# Patient Record
Sex: Female | Born: 1940 | Race: White | Hispanic: No | Smoking: Former smoker
Health system: Southern US, Community
[De-identification: ages and names within clinical notes are randomized; demographics above are authoritative.]

## PROBLEM LIST (undated history)

## (undated) DIAGNOSIS — N189 Chronic kidney disease, unspecified: Secondary | ICD-10-CM

## (undated) DIAGNOSIS — I471 Supraventricular tachycardia, unspecified: Secondary | ICD-10-CM

## (undated) DIAGNOSIS — C55 Malignant neoplasm of uterus, part unspecified: Secondary | ICD-10-CM

## (undated) DIAGNOSIS — M1990 Unspecified osteoarthritis, unspecified site: Secondary | ICD-10-CM

## (undated) DIAGNOSIS — I10 Essential (primary) hypertension: Secondary | ICD-10-CM

## (undated) DIAGNOSIS — E785 Hyperlipidemia, unspecified: Secondary | ICD-10-CM

## (undated) DIAGNOSIS — K589 Irritable bowel syndrome without diarrhea: Secondary | ICD-10-CM

## (undated) DIAGNOSIS — J449 Chronic obstructive pulmonary disease, unspecified: Secondary | ICD-10-CM

## (undated) DIAGNOSIS — J9611 Chronic respiratory failure with hypoxia: Secondary | ICD-10-CM

## (undated) DIAGNOSIS — G2581 Restless legs syndrome: Secondary | ICD-10-CM

## (undated) DIAGNOSIS — C541 Malignant neoplasm of endometrium: Secondary | ICD-10-CM

## (undated) DIAGNOSIS — K5790 Diverticulosis of intestine, part unspecified, without perforation or abscess without bleeding: Secondary | ICD-10-CM

## (undated) HISTORY — DX: Essential (primary) hypertension: I10

## (undated) HISTORY — DX: Chronic kidney disease, unspecified: N18.9

## (undated) HISTORY — DX: Restless legs syndrome: G25.81

## (undated) HISTORY — DX: Diverticulosis of intestine, part unspecified, without perforation or abscess without bleeding: K57.90

## (undated) HISTORY — PX: ABDOMINAL HYSTERECTOMY: SHX81

## (undated) HISTORY — DX: Unspecified osteoarthritis, unspecified site: M19.90

## (undated) HISTORY — DX: Malignant neoplasm of endometrium: C54.1

## (undated) HISTORY — DX: Hyperlipidemia, unspecified: E78.5

## (undated) HISTORY — DX: Malignant neoplasm of uterus, part unspecified: C55

## (undated) HISTORY — DX: Irritable bowel syndrome, unspecified: K58.9

---

## 2000-12-08 HISTORY — PX: LUNG SURGERY: SHX703

## 2002-06-10 ENCOUNTER — Inpatient Hospital Stay (HOSPITAL_COMMUNITY): Admission: EM | Admit: 2002-06-10 | Discharge: 2002-06-12 | Payer: Self-pay | Admitting: Emergency Medicine

## 2002-06-10 ENCOUNTER — Encounter: Payer: Self-pay | Admitting: Internal Medicine

## 2002-06-11 ENCOUNTER — Encounter: Payer: Self-pay | Admitting: Internal Medicine

## 2002-06-12 ENCOUNTER — Encounter: Payer: Self-pay | Admitting: Internal Medicine

## 2002-07-21 ENCOUNTER — Encounter: Payer: Self-pay | Admitting: Internal Medicine

## 2002-07-21 ENCOUNTER — Encounter: Admission: RE | Admit: 2002-07-21 | Discharge: 2002-07-21 | Payer: Self-pay | Admitting: Internal Medicine

## 2002-08-15 ENCOUNTER — Encounter: Admission: RE | Admit: 2002-08-15 | Discharge: 2002-08-15 | Payer: Self-pay | Admitting: Gastroenterology

## 2002-08-15 ENCOUNTER — Encounter: Payer: Self-pay | Admitting: Gastroenterology

## 2002-09-28 ENCOUNTER — Ambulatory Visit (HOSPITAL_COMMUNITY): Admission: RE | Admit: 2002-09-28 | Discharge: 2002-09-28 | Payer: Self-pay | Admitting: Gastroenterology

## 2003-04-13 ENCOUNTER — Other Ambulatory Visit: Admission: RE | Admit: 2003-04-13 | Discharge: 2003-04-13 | Payer: Self-pay | Admitting: Obstetrics and Gynecology

## 2004-03-06 ENCOUNTER — Encounter: Admission: RE | Admit: 2004-03-06 | Discharge: 2004-03-06 | Payer: Self-pay | Admitting: Internal Medicine

## 2004-05-01 ENCOUNTER — Encounter: Admission: RE | Admit: 2004-05-01 | Discharge: 2004-05-01 | Payer: Self-pay | Admitting: Internal Medicine

## 2004-06-05 ENCOUNTER — Other Ambulatory Visit: Admission: RE | Admit: 2004-06-05 | Discharge: 2004-06-05 | Payer: Self-pay | Admitting: Obstetrics and Gynecology

## 2005-05-13 ENCOUNTER — Encounter: Admission: RE | Admit: 2005-05-13 | Discharge: 2005-05-13 | Payer: Self-pay | Admitting: Internal Medicine

## 2005-05-13 ENCOUNTER — Encounter: Admission: RE | Admit: 2005-05-13 | Discharge: 2005-05-13 | Payer: Self-pay | Admitting: Obstetrics and Gynecology

## 2005-06-12 ENCOUNTER — Other Ambulatory Visit: Admission: RE | Admit: 2005-06-12 | Discharge: 2005-06-12 | Payer: Self-pay | Admitting: Obstetrics and Gynecology

## 2005-07-14 ENCOUNTER — Encounter: Admission: RE | Admit: 2005-07-14 | Discharge: 2005-07-14 | Payer: Self-pay | Admitting: Obstetrics and Gynecology

## 2006-05-13 ENCOUNTER — Encounter: Admission: RE | Admit: 2006-05-13 | Discharge: 2006-05-13 | Payer: Self-pay | Admitting: Internal Medicine

## 2006-05-18 ENCOUNTER — Encounter: Admission: RE | Admit: 2006-05-18 | Discharge: 2006-05-18 | Payer: Self-pay | Admitting: Internal Medicine

## 2006-08-13 ENCOUNTER — Encounter: Admission: RE | Admit: 2006-08-13 | Discharge: 2006-08-13 | Payer: Self-pay | Admitting: Internal Medicine

## 2006-09-23 ENCOUNTER — Other Ambulatory Visit: Admission: RE | Admit: 2006-09-23 | Discharge: 2006-09-23 | Payer: Self-pay | Admitting: Obstetrics and Gynecology

## 2006-10-05 ENCOUNTER — Encounter: Admission: RE | Admit: 2006-10-05 | Discharge: 2006-10-05 | Payer: Self-pay | Admitting: Obstetrics and Gynecology

## 2007-11-17 ENCOUNTER — Encounter: Admission: RE | Admit: 2007-11-17 | Discharge: 2007-11-17 | Payer: Self-pay | Admitting: Obstetrics and Gynecology

## 2008-02-14 ENCOUNTER — Encounter: Admission: RE | Admit: 2008-02-14 | Discharge: 2008-02-14 | Payer: Self-pay | Admitting: Internal Medicine

## 2008-10-09 ENCOUNTER — Encounter (INDEPENDENT_AMBULATORY_CARE_PROVIDER_SITE_OTHER): Payer: Self-pay | Admitting: Urology

## 2008-10-09 ENCOUNTER — Ambulatory Visit (HOSPITAL_BASED_OUTPATIENT_CLINIC_OR_DEPARTMENT_OTHER): Admission: RE | Admit: 2008-10-09 | Discharge: 2008-10-09 | Payer: Self-pay | Admitting: Urology

## 2008-10-23 ENCOUNTER — Ambulatory Visit (HOSPITAL_BASED_OUTPATIENT_CLINIC_OR_DEPARTMENT_OTHER): Admission: RE | Admit: 2008-10-23 | Discharge: 2008-10-23 | Payer: Self-pay | Admitting: Urology

## 2011-04-22 NOTE — Op Note (Signed)
NAME:  Carol Carson, Carol Carson               ACCOUNT NO.:  [id number]   MEDICAL RECORD NO.:  [id number]          PATIENT TYPE:  AMB   LOCATION:  [facility]                         FACILITY:  [facility]   PHYSICIAN:  Carol Carson, M.D.  DATE OF BIRTH:  [birth year]   DATE OF PROCEDURE:  10/23/2008  DATE OF DISCHARGE:                               OPERATIVE REPORT   SURGEON:  Carol Carson, M.D.   ASSISTANT:  Dr. Aldean Carson.   PREOPERATIVE DIAGNOSIS:  Stress urinary incontinence after recent  transobturator tape.   POSTOPERATIVE DIAGNOSIS:  Stress urinary incontinence after recent  transobturator tape.   PROCEDURES:  1. Complete removal of transobturator tape.  2. Placement of suprapubic mid urethral tension-free sling with      Tutoplast dermis graft.  [place] system from [facility].  3. Pan cystourethroscopy.   INDICATIONS:  Carol Carson is a very pleasant 70 year old female with a  history of stress urinary incontinence and chronic vulvodynia.  She  underwent an anterior repair and transobturator sling done on October 09, 2008.  Postoperatively, she had persistent stress urinary  incontinence and after discussion of the risks and benefits, she elected  to proceed with sling takedown and redo sling.  Again, risks and  benefits were discussed with the patient in advance including the risks  of mesh erosion and extrusion particularly in the setting of a redo  surgery.   PROCEDURE IN DETAIL:  The patient was brought back to the operating room  and after successful induction of LMA anesthetic, she was placed in  dorsal position and all pressure points were padded appropriately.  A  preoperative time-out was performed.  She received perioperative  antibiotics.   A [place] retractor was placed and her labia were retracted laterally  and her posterior fourchette was retracted posteriorly.  We then  identified the previous incision site and opened it up with a knife.  We  then able to easily identify the  transobturator tape which had loosened  a significant amount and was present slightly distal to the mid urethra.  It was removed from both sides in its entirety with some traction.   At this point with manual palpation, the tissue over the urethra  appeared thin.  The Foley catheter could not be seen and there was no  evidence of urethral injury.  However, again the tissue was thin.  At  this point we elected to use a Tutoplast dermis graft that was placed on  top of the mid urethral sling such that there was a fair amount of  tissue between the urethra and the sling to prevent or minimize the risk  of erosion and extrusion.  At this point with adequate dissection, the  suprapubic needle passers were placed and placed behind the bone through  the ipsilateral vaginal dissection and out through the incision.  With  both the passers in place, pan cystourethroscopy was performed and there  was no evidence of urethra or bladder injury.  There is no evidence of  hematuria or other anomalies.  Both ureteral orifices were seen  effluxing urine  on both sides.  At this point the fashioned [place] graft  with the Tutoplast dermis was placed on the passers and brought back up  to the suprapubic stab incisions.  Adequate tensioning was accomplished  and with the tensioning in place, the bladder was filled to the 200 mL  of normal saline.  With suprapubic pressure, there was no demonstrable  leakage.  At this point the sling was cut flush with the skin and the  vaginal incision was closed in a running fashion.  Please note  throughout the closing process, antibiotic irrigation was used to  irrigate the wound copiously.  There was no evidence of bleeding and the  procedure was ended.  A vaginal pack was not left in place and the  bladder was drained.  Dr. Ihor Carson was the attending surgeon who was  responsible for care of the patient in this case.   ESTIMATED BLOOD LOSS:  Minimal.   URINE OUTPUT:   Unrecorded.   DRAINS:  None.   SPECIMENS:  None.   DISPOSITION:  The patient will go to the PACU for further care.      Carol Kitten, MD      Carol Carson, M.D.  Electronically Signed    DW/MEDQ  D:  10/23/2008  T:  10/24/2008  Job:  [id number]

## 2011-04-22 NOTE — Op Note (Signed)
NAME:  Carol Carson, Carol Carson               ACCOUNT NO.:  [id number]   MEDICAL RECORD NO.:  [id number]          PATIENT TYPE:  AMB   LOCATION:  [facility]                         FACILITY:  [facility]   PHYSICIAN:  Mark C. Vernie Ammons, M.D.  DATE OF BIRTH:  [birth year]   DATE OF PROCEDURE:  10/09/2008  DATE OF DISCHARGE:                               OPERATIVE REPORT   PREOPERATIVE DIAGNOSES:  1. Stress urinary incontinence.  2. Cystocele.   POSTOPERATIVE DIAGNOSES:  1. Stress urinary incontinence.  2. Cystocele.   PROCEDURE:  1. Transobturator sling.  2. Anterior repair.   SURGEON:  Mark C. Vernie Ammons, M.D.   ANESTHESIA:  General with local supplement.   SPECIMENS:  Vaginal mucosa to pathology.   BLOOD LOSS:  Minimal.   DRAINS:  None.   COMPLICATIONS:  None.   INDICATIONS:  The patient is a 70 year old female who was noted to have  a cystocele at home and was seen for further evaluation.  She has a  history of having had a hysterectomy and a bladder tack.  She was  found to have a grade 3 cystocele on exam with loss urethrovesical  junction support and urodynamics confirmed the presence of stress  urinary incontinence.  We discussed the risks, complications,  alternatives and limitations to surgery.  She understands and has  elected to proceed.   DESCRIPTION OF OPERATION:  After informed consent, the patient was  brought to the major OR, placed on the table and administered general  anesthesia.  Genitalia and vagina were sterilely prepped and draped.  An  official time-out was then performed.   A 16-French Foley catheter was then placed in the bladder.  The bladder  was completely drained.  I injected 0.5% Marcaine with epinephrine in  the subvaginal mucosa in the midline and attempted to place a weighted  speculum in the vagina, however, the introitus was too narrow to allow  this.  I therefore used a Deaver retractor placed in the inferior aspect  of the vagina to allow visualization.  An  Allis clamp was placed just  below the urethral meatus and an incision was then made, after allowing  adequate time for epinephrine effect, from the entrance of the introitus  back to the posterior aspect of the vagina.  Allis clamps were placed on  the two vaginal mucosal edges and sharp and blunt dissection were then  used to dissect the bladder from the vaginal mucosa to a point where the  pubocervical fascia could be identified.  I then used interrupted 0  Ethibond suture in a figure-of-eight fashion to reapproximate the  pubocervical fascia in the midline.  In doing this, the cystocele was  obliterated completely.  I did note though, that the tissue appeared to  be very friable.  I then excised the excess vaginal mucosa and this was  sent to pathology.   The bladder was then completely drained and a point was marked 5 mm  lateral to the midline at the level of the clitoris with the marking  pen.  This was palpated and noted to be at the medial  aspect of the  obturator fossa on each side.  I then injected 0.5% Marcaine with  epinephrine in this location and made a stab incision.  With the bladder  completely drained, I passed the transobturator trocar through the skin  incision, through the obturator fascia at the medial aspect of the  obturator fossa and out through the vaginal incision at the level of the  mid urethra.  This was performed on right and left sides.  I then  affixed to the sling material to the trocar and withdrew this back  through the skin incision.  Palpation of the vagina revealed no  buttonholing.  I therefore removed the catheter.   Cystoscopy was performed with a 22-French cystoscope and 70 degrees  lens.  The bladder was entered and fully inspected and noted to be free  of any tumor, stones or inflammatory lesions.  The mucosa appeared  normal and the ureteral orifices were normal in configuration and  position.  I fully inspected the bladder and noted no  injury, foreign  body or other abnormality.  I therefore removed the cystoscope and  reinserted the Foley catheter.  I then positioned the sling at the mid  urethra and removed the plastic sheathing first on one and then the  other side.  I then adjusted the tension so that the sling laid at the  mid urethral level with no tension.  I then excised the excess sling  material and closed the skin incisions with Dermabond.   Attention was then redirected to the vagina and the vaginal incision was  copiously irrigated with antibiotic solution.  I then reapproximated the  vaginal mucosa in the midline with running 2-0 Vicryl suture.  Again I  noted the vaginal mucosa to be somewhat thin although it was  reapproximated easily with no tension.  I then inserted the vaginal  packing using 2 inch Iodoform gauze with bacitracin ointment applied and  then at the end applied Emla cream to the introitus because of the  patient's past history of vulvodynia.  The catheter was removed and she  was awakened and taken to recovery room in stable satisfactory  condition.  She tolerated the procedure well.  There were no  intraoperative complications.   She will be given a prescription for Vicodin HP #40, Cipro 250 mg b.i.d.  #10 and Emla cream that she will be instructed to apply to the introital  region q.12 h. p.r.n. pain.  I gave her a 30 grams tube of that.  She  will follow-up with me in the office in 1 week.  We will remove her  vaginal packing at home in 24 hours.      Mark C. Vernie Ammons, M.D.  Electronically Signed     MCO/MEDQ  D:  10/09/2008  T:  10/09/2008  Job:  [id number]

## 2011-04-25 NOTE — Op Note (Signed)
NAME:  Carol Carson, Carol Carson                         ACCOUNT NO.:  [id number]   MEDICAL RECORD NO.:  [id number]                   PATIENT TYPE:  AMB   LOCATION:  [facility]                                 FACILITY:  [facility]   PHYSICIAN:  Anselmo Rod, M.D.               DATE OF BIRTH:  09/20/1941   DATE OF PROCEDURE:  09/28/2002  DATE OF DISCHARGE:                                 OPERATIVE REPORT   PROCEDURE PERFORMED:  Colonoscopy.   ENDOSCOPIST:  Charna Elizabeth, M.D.   INSTRUMENT USED:  Olympus video colonoscope.   INDICATIONS FOR PROCEDURE:  The patient is a 70 year old white female  undergoing screening colonoscopy to rule out colonic polyps, masses, etc.   PREPROCEDURE PREPARATION:  Informed consent was procured from the patient.  The patient was fasted for eight hours prior to the procedure and prepped  with a bottle of magnesium citrate and a gallon of NuLytely the night prior  to the procedure.  The patient gives a history of mitral valve prolapse  requiring antibiotics before the procedures but has multiple drug allergies  including PENICILLIN, SULFA, CIPRO, BIAXIN, etc.  There are some other  medications, the patient states she is allergic to but does not remember the  name, therefore the plan was made to do the procedure before giving  antibiotics.  If no biopsy or polypectomy were done, we would hold off on  administering antibiotics to the patient.   PREPROCEDURE PHYSICAL:  The patient had stable vital signs. Neck supple.  Chest clear to auscultation.  S1 and S2 regular.  Abdomen soft with normal  bowel sounds.  Small umbilical hernia seen on abdominal exam.   DESCRIPTION OF PROCEDURE:  The patient was placed in left lateral decubitus  position and sedated with 60 mg of Demerol and 7 mg of Versed intravenously.  Once the patient was adequately sedated and maintained on low flow oxygen  and continuous cardiac monitoring, the Olympus video colonoscope was  advanced from the  rectum to the cecum and terminal ileum without difficulty.  The entire colonic mucosa and the terminal ileum appeared normal except for  small nonbleeding internal hemorrhoids.  No other abnormalities were noted.  No masses, polyps, erosions, ulcerations or diverticula were present.   IMPRESSION:  Normal colonoscopy up to the terminal ileum except for small  internal hemorrhoids.   RECOMMENDATIONS:  1. A high fiber diet has been discussed with the patient in great detail.     Brochures have been given to her for her education.  2.     Outpatient follow-up is advised in the next two weeks or earlier if need be.  3. Repeat colorectal cancer screening is recommended in the next 10 years     unless the patient develops any abnormal symptoms in the interim.  Anselmo Rod, M.D.    JNM/MEDQ  D:  09/28/2002  T:  09/28/2002  Job:  [id number]   cc:   Olene Craven, M.D.

## 2011-09-09 LAB — POCT I-STAT 4, (NA,K, GLUC, HGB,HCT)
Glucose, Bld: 79
Glucose, Bld: 90
HCT: 39
HCT: 40
Hemoglobin: 13.3
Hemoglobin: 13.6
Potassium: 3.7
Potassium: 4.1
Sodium: 137
Sodium: 138

## 2012-01-22 DIAGNOSIS — M899 Disorder of bone, unspecified: Secondary | ICD-10-CM | POA: Diagnosis not present

## 2012-01-22 DIAGNOSIS — R82998 Other abnormal findings in urine: Secondary | ICD-10-CM | POA: Diagnosis not present

## 2012-01-22 DIAGNOSIS — Z0000 Encounter for general adult medical examination without abnormal findings: Secondary | ICD-10-CM | POA: Diagnosis not present

## 2012-01-22 DIAGNOSIS — M949 Disorder of cartilage, unspecified: Secondary | ICD-10-CM | POA: Diagnosis not present

## 2012-02-12 DIAGNOSIS — H43399 Other vitreous opacities, unspecified eye: Secondary | ICD-10-CM | POA: Diagnosis not present

## 2012-02-12 DIAGNOSIS — H2510 Age-related nuclear cataract, unspecified eye: Secondary | ICD-10-CM | POA: Diagnosis not present

## 2012-02-12 DIAGNOSIS — H538 Other visual disturbances: Secondary | ICD-10-CM | POA: Diagnosis not present

## 2012-02-16 DIAGNOSIS — M949 Disorder of cartilage, unspecified: Secondary | ICD-10-CM | POA: Diagnosis not present

## 2012-02-16 DIAGNOSIS — M899 Disorder of bone, unspecified: Secondary | ICD-10-CM | POA: Diagnosis not present

## 2012-03-02 DIAGNOSIS — H269 Unspecified cataract: Secondary | ICD-10-CM | POA: Diagnosis not present

## 2012-03-02 DIAGNOSIS — H538 Other visual disturbances: Secondary | ICD-10-CM | POA: Diagnosis not present

## 2012-03-02 DIAGNOSIS — H2510 Age-related nuclear cataract, unspecified eye: Secondary | ICD-10-CM | POA: Diagnosis not present

## 2012-03-29 DIAGNOSIS — L820 Inflamed seborrheic keratosis: Secondary | ICD-10-CM | POA: Diagnosis not present

## 2012-03-29 DIAGNOSIS — D485 Neoplasm of uncertain behavior of skin: Secondary | ICD-10-CM | POA: Diagnosis not present

## 2012-04-05 DIAGNOSIS — H2510 Age-related nuclear cataract, unspecified eye: Secondary | ICD-10-CM | POA: Diagnosis not present

## 2012-04-13 DIAGNOSIS — H269 Unspecified cataract: Secondary | ICD-10-CM | POA: Diagnosis not present

## 2012-04-13 DIAGNOSIS — H538 Other visual disturbances: Secondary | ICD-10-CM | POA: Diagnosis not present

## 2012-04-13 DIAGNOSIS — H2510 Age-related nuclear cataract, unspecified eye: Secondary | ICD-10-CM | POA: Diagnosis not present

## 2012-04-26 DIAGNOSIS — L723 Sebaceous cyst: Secondary | ICD-10-CM | POA: Diagnosis not present

## 2012-06-02 DIAGNOSIS — R350 Frequency of micturition: Secondary | ICD-10-CM | POA: Diagnosis not present

## 2012-08-12 DIAGNOSIS — F329 Major depressive disorder, single episode, unspecified: Secondary | ICD-10-CM | POA: Diagnosis not present

## 2012-08-12 DIAGNOSIS — E785 Hyperlipidemia, unspecified: Secondary | ICD-10-CM | POA: Diagnosis not present

## 2012-08-12 DIAGNOSIS — J449 Chronic obstructive pulmonary disease, unspecified: Secondary | ICD-10-CM | POA: Diagnosis not present

## 2012-08-12 DIAGNOSIS — D509 Iron deficiency anemia, unspecified: Secondary | ICD-10-CM | POA: Diagnosis not present

## 2012-08-12 DIAGNOSIS — I10 Essential (primary) hypertension: Secondary | ICD-10-CM | POA: Diagnosis not present

## 2012-08-23 DIAGNOSIS — H02409 Unspecified ptosis of unspecified eyelid: Secondary | ICD-10-CM | POA: Diagnosis not present

## 2012-08-23 DIAGNOSIS — H532 Diplopia: Secondary | ICD-10-CM | POA: Diagnosis not present

## 2012-08-23 DIAGNOSIS — Z961 Presence of intraocular lens: Secondary | ICD-10-CM | POA: Diagnosis not present

## 2012-08-23 DIAGNOSIS — G803 Athetoid cerebral palsy: Secondary | ICD-10-CM | POA: Diagnosis not present

## 2012-11-24 DIAGNOSIS — H43399 Other vitreous opacities, unspecified eye: Secondary | ICD-10-CM | POA: Diagnosis not present

## 2012-11-24 DIAGNOSIS — Z961 Presence of intraocular lens: Secondary | ICD-10-CM | POA: Diagnosis not present

## 2012-11-24 DIAGNOSIS — H35379 Puckering of macula, unspecified eye: Secondary | ICD-10-CM | POA: Diagnosis not present

## 2012-12-03 DIAGNOSIS — I10 Essential (primary) hypertension: Secondary | ICD-10-CM | POA: Diagnosis not present

## 2012-12-03 DIAGNOSIS — R002 Palpitations: Secondary | ICD-10-CM | POA: Diagnosis not present

## 2012-12-10 DIAGNOSIS — L659 Nonscarring hair loss, unspecified: Secondary | ICD-10-CM | POA: Diagnosis not present

## 2012-12-10 DIAGNOSIS — I10 Essential (primary) hypertension: Secondary | ICD-10-CM | POA: Diagnosis not present

## 2012-12-18 DIAGNOSIS — R002 Palpitations: Secondary | ICD-10-CM | POA: Diagnosis not present

## 2012-12-20 DIAGNOSIS — R000 Tachycardia, unspecified: Secondary | ICD-10-CM | POA: Diagnosis not present

## 2012-12-20 DIAGNOSIS — R002 Palpitations: Secondary | ICD-10-CM | POA: Diagnosis not present

## 2012-12-20 DIAGNOSIS — I471 Supraventricular tachycardia: Secondary | ICD-10-CM | POA: Diagnosis not present

## 2012-12-20 DIAGNOSIS — I10 Essential (primary) hypertension: Secondary | ICD-10-CM | POA: Diagnosis not present

## 2012-12-27 DIAGNOSIS — R000 Tachycardia, unspecified: Secondary | ICD-10-CM | POA: Diagnosis not present

## 2012-12-27 DIAGNOSIS — L659 Nonscarring hair loss, unspecified: Secondary | ICD-10-CM | POA: Diagnosis not present

## 2012-12-27 DIAGNOSIS — I10 Essential (primary) hypertension: Secondary | ICD-10-CM | POA: Diagnosis not present

## 2012-12-29 ENCOUNTER — Encounter (HOSPITAL_COMMUNITY): Payer: Self-pay

## 2012-12-29 ENCOUNTER — Emergency Department (HOSPITAL_COMMUNITY)
Admission: EM | Admit: 2012-12-29 | Discharge: 2012-12-29 | Disposition: A | Discharge status: Home / Self Care | Payer: Medicare Other | Attending: Emergency Medicine | Admitting: Emergency Medicine

## 2012-12-29 DIAGNOSIS — J4489 Other specified chronic obstructive pulmonary disease: Secondary | ICD-10-CM | POA: Insufficient documentation

## 2012-12-29 DIAGNOSIS — I498 Other specified cardiac arrhythmias: Secondary | ICD-10-CM | POA: Diagnosis not present

## 2012-12-29 DIAGNOSIS — I471 Supraventricular tachycardia: Secondary | ICD-10-CM

## 2012-12-29 DIAGNOSIS — R000 Tachycardia, unspecified: Secondary | ICD-10-CM | POA: Diagnosis not present

## 2012-12-29 DIAGNOSIS — I10 Essential (primary) hypertension: Secondary | ICD-10-CM | POA: Diagnosis not present

## 2012-12-29 DIAGNOSIS — Z87891 Personal history of nicotine dependence: Secondary | ICD-10-CM | POA: Insufficient documentation

## 2012-12-29 DIAGNOSIS — R002 Palpitations: Secondary | ICD-10-CM | POA: Insufficient documentation

## 2012-12-29 DIAGNOSIS — J449 Chronic obstructive pulmonary disease, unspecified: Secondary | ICD-10-CM | POA: Diagnosis not present

## 2012-12-29 HISTORY — DX: Supraventricular tachycardia, unspecified: I47.10

## 2012-12-29 HISTORY — DX: Supraventricular tachycardia: I47.1

## 2012-12-29 HISTORY — DX: Chronic obstructive pulmonary disease, unspecified: J44.9

## 2012-12-29 LAB — CBC WITH DIFFERENTIAL/PLATELET
Basophils Absolute: 0 10*3/uL (ref 0.0–0.1)
Basophils Relative: 1 % (ref 0–1)
Eosinophils Absolute: 0.1 10*3/uL (ref 0.0–0.7)
Hemoglobin: 14.2 g/dL (ref 12.0–15.0)
Lymphocytes Relative: 35 % (ref 12–46)
MCH: 29.7 pg (ref 26.0–34.0)
Monocytes Absolute: 0.4 10*3/uL (ref 0.1–1.0)
Monocytes Relative: 8 % (ref 3–12)
RBC: 4.78 MIL/uL (ref 3.87–5.11)
WBC: 5.4 10*3/uL (ref 4.0–10.5)

## 2012-12-29 LAB — POCT I-STAT, CHEM 8
BUN: 20 mg/dL (ref 6–23)
Glucose, Bld: 104 mg/dL — ABNORMAL HIGH (ref 70–99)
Sodium: 142 mEq/L (ref 135–145)
TCO2: 29 mmol/L (ref 0–100)

## 2012-12-29 LAB — MAGNESIUM: Magnesium: 1.9 mg/dL (ref 1.5–2.5)

## 2012-12-29 MED ORDER — ADENOSINE 6 MG/2ML IV SOLN
INTRAVENOUS | Status: AC
  Administered 2012-12-29: 6 mg
  Filled 2012-12-29: qty 6

## 2012-12-29 NOTE — ED Notes (Signed)
Pt converted into NSR, VSS. No distress noted.

## 2012-12-29 NOTE — ED Notes (Signed)
Pt.  Developed a fast heart rate over 1 month ago,  Wearing a holter monitor since last week,  Dr. Mayford Knife called pt. Yesterday  And told her come today to decrease the heart rate.  Denies any sob or pain or dizziness

## 2012-12-29 NOTE — ED Notes (Signed)
Admit date: 12/29/2012 Referring Physician  Dr. Rubin Payor Primary Cardiologist  Dr. 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  She had been on atenolol but started having hair loss.  Therefore, her beta blocker was changed to diltiazem 120 mg daily.  She was wearing a heart monitor.  She was seen in the office today and found to be a persistent tachycardia with a heart rate of about 140 beats per minute.  The patient was essentially asymptomatic.  She was sent to the emergency room for intravenous adenosine.  Currently, the patient is stable hemodynamically.  She does not feel her palpitations.  She is not reporting chest pain or shortness of breath.  She wants to go home to take care of her dog.     PMH:   Past Medical History  Diagnosis Date  . COPD (chronic obstructive pulmonary disease)    SVT  PSH:  History reviewed. No pertinent past surgical history.  Allergies:  Biaxin; Penicillins; and Sulfa antibiotics Prior to Admit Meds:  Diltiazem 120 mg daily Fam HX:   No family history on file. Social HX:    History   Social History  . Marital Status: Widowed    Spouse Name: N/A    Number of Children: N/A  . Years of Education: N/A   Occupational History  . Not on file.   Social History Main Topics  . Smoking status: Former Games developer  . Smokeless tobacco: Never Used  . Alcohol Use: No  . Drug Use:   . Sexually Active:    Other Topics Concern  . Not on file   Social History Narrative  . No narrative on file     ROS:  All 11 ROS were addressed and are negative except what is stated in the HPI  Physical Exam: Blood pressure 95/55, pulse 96, temperature 97.4 F (36.3 C), temperature source Oral, resp. rate 18, SpO2 97.00%.    General: Well developed, well nourished, in no acute distress Head:  Normal cephalic and atramatic  Lungs:   Clear bilaterally to auscultation and percussion. Heart:   HRRR  S1 S2  Abdomen: Bowel sounds are positive, abdomen soft and non-tender without masses or                  Hernia's noted. Msk:   Normal strength and tone for age.  Neuro: Alert and oriented X 3. Psych:  Normal affect, responds appropriately    Labs:   Lab Results  Component Value Date   WBC 5.4 12/29/2012   HGB 15.0 12/29/2012   HCT 44.0 12/29/2012   MCV 88.7 12/29/2012   PLT 202 12/29/2012    Lab 12/29/12 1244  NA 142  K 3.6  CL 102  CO2 --  BUN 20  CREATININE 1.70*  CALCIUM --  PROT --  BILITOT --  ALKPHOS --  ALT --  AST --  GLUCOSE 104*   No results found for this basename: PTT   No results found for this basename: INR, PROTIME   No results found for this basename: CKTOTAL, CKMB, CKMBINDEX, TROPONINI     No results found for this basename: CHOL   No results found for this basename: HDL   No results found for this basename: LDLCALC   No results found for this basename: TRIG   No results found for this basename: CHOLHDL   No results found for this basename: LDLDIRECT  Radiology:  No results found.  EKG:  Atrial tachycardia with a rate of 140  ASSESSMENT: SVT, likely atrial tachycardia  PLAN:  The patient was given 6 mg of intravenous again seen.  There were no flutter waves noted when she had complete AV nodal blockade.  She did convert to normal sinus rhythm.  It is likely that her tachycardia is an atrial tachycardia.  Will increase diltiazem to 120 mg by mouth twice a day.  She will continue to wear her heart monitor.  She will followup with Dr. Mayford Knife in one week.  Of note, she had lab tests done in September of 2013 including TSH which was normal.  If she had significant symptoms from her SVT, could try flecainide or perhaps a known.  Otherwise, could refer to electrophysiology for evaluation for an ablation.  Corky Crafts., MD  12/29/2012  12:50 PM

## 2012-12-29 NOTE — ED Notes (Signed)
Cardiology at the bedside to convert pt. Pt placed on zoll pads and cardiac monitor. [place] in place.

## 2012-12-30 NOTE — ED Provider Notes (Signed)
History     CSN: [id number]  Arrival date & time 12/29/12  1144   First MD Initiated Contact with Patient 12/29/12 1203      Chief Complaint  Patient presents with  . Irregular Heart Beat    (Consider location/radiation/quality/duration/timing/severity/associated sxs/prior treatment) [name].   patient's had episodes of fast heart rate. She's been wearing a Holter monitor. She states her cardiologist called and told her to come to the ER to decrease the heart rate. No lightheadedness or dizziness. She states she has had some palpitations and also goes fast. Denies substance abuse. She denies chest pain.  Past Medical History  Diagnosis Date  . COPD (chronic obstructive pulmonary disease)   . SVT (supraventricular tachycardia)     History reviewed. No pertinent past surgical history.  No family history on file.  History  Substance Use Topics  . Smoking status: Former Games developer  . Smokeless tobacco: Never Used  . Alcohol Use: No    OB History    Grav Para Term Preterm Abortions TAB SAB Ect Mult Living                  Review of Systems  Constitutional: Negative for activity change and appetite change.  HENT: Negative for neck stiffness.   Eyes: Negative for pain.  Respiratory: Negative for chest tightness and shortness of breath.   Cardiovascular: Positive for palpitations. Negative for chest pain and leg swelling.  Gastrointestinal: Negative for nausea, vomiting, abdominal pain and diarrhea.  Genitourinary: Negative for flank pain.  Musculoskeletal: Negative for back pain.  Skin: Negative for rash.  Neurological: Negative for weakness, numbness and headaches.  Psychiatric/Behavioral: Negative for behavioral problems.    Allergies  Biaxin; Penicillins; and Sulfa antibiotics  Home Medications  No current outpatient prescriptions on file.  BP 95/55  Pulse 96  Temp 97.4 F (36.3 C) (Oral)  Resp 18  SpO2 97%  Physical Exam    Nursing note and vitals reviewed. Constitutional: She is oriented to person, place, and time. She appears well-developed and well-nourished.  HENT:  Head: Normocephalic and atraumatic.  Eyes: EOM are normal. Pupils are equal, round, and reactive to light.  Neck: Normal range of motion. Neck supple.  Cardiovascular: Regular rhythm and normal heart sounds.   No murmur heard.      Tachycardia.  Pulmonary/Chest: Effort normal and breath sounds normal. No respiratory distress. She has no wheezes. She has no rales.  Abdominal: Soft. Bowel sounds are normal. She exhibits no distension. There is no tenderness. There is no rebound and no guarding.  Musculoskeletal: Normal range of motion.  Neurological: She is alert and oriented to person, place, and time. No cranial nerve deficit.  Skin: Skin is warm and dry.  Psychiatric: She has a normal mood and affect. Her speech is normal.    ED Course  Procedures (including critical care time)  Labs Reviewed  POCT I-STAT, CHEM 8 - Abnormal; Notable for the following:    Creatinine, Ser 1.70 (*)     Glucose, Bld 104 (*)     All other components within normal limits  CBC WITH DIFFERENTIAL  MAGNESIUM  LAB REPORT - SCANNED   No results found.   1. SVT (supraventricular tachycardia)     Date: 12/30/2012  Rate: 145  Rhythm: svt  QRS Axis: normal  Intervals: normal  ST/T Wave abnormalities: normal  Conduction Disutrbances:none  Narrative Interpretation: SVT is new  Old EKG Reviewed: changes noted  MDM  Patient with a sinus tachycardia. Neck in ER by Dr. Eldridge Dace. He gave adenosine and discharge the patient.        Juliet Rude. Rubin Payor, MD 12/30/12 [phone]

## 2013-01-02 ENCOUNTER — Encounter: Payer: Self-pay | Admitting: Internal Medicine

## 2013-01-06 DIAGNOSIS — I10 Essential (primary) hypertension: Secondary | ICD-10-CM | POA: Diagnosis not present

## 2013-01-06 DIAGNOSIS — J449 Chronic obstructive pulmonary disease, unspecified: Secondary | ICD-10-CM | POA: Diagnosis not present

## 2013-01-06 DIAGNOSIS — I471 Supraventricular tachycardia: Secondary | ICD-10-CM | POA: Diagnosis not present

## 2013-01-10 ENCOUNTER — Ambulatory Visit (INDEPENDENT_AMBULATORY_CARE_PROVIDER_SITE_OTHER): Payer: Medicare Other | Admitting: Internal Medicine

## 2013-01-10 ENCOUNTER — Encounter: Payer: Self-pay | Admitting: Internal Medicine

## 2013-01-10 VITALS — BP 126/53 | HR 115 | Ht 63.5 in | Wt 117.4 lb

## 2013-01-10 DIAGNOSIS — I471 Supraventricular tachycardia: Secondary | ICD-10-CM

## 2013-01-10 DIAGNOSIS — I10 Essential (primary) hypertension: Secondary | ICD-10-CM | POA: Diagnosis not present

## 2013-01-10 DIAGNOSIS — I498 Other specified cardiac arrhythmias: Secondary | ICD-10-CM | POA: Diagnosis not present

## 2013-01-10 MED ORDER — FLECAINIDE ACETATE 50 MG PO TABS: 50.0000 mg | ORAL_TABLET | Freq: Two times a day (BID) | ORAL | Status: DC

## 2013-01-10 NOTE — Assessment & Plan Note (Signed)
The patient presents today for EP consultation regarding recently discovered SVT.  Today, she presents in sinus tachycardia.  I have reviewed her event monitor which reveals multiple episodes of short RP tachycardia suggestive of SVT.  At times, the tachycardia takes on a long RP variation and is therefore suggestive of possibly atrial tachycardia. Therapeutic strategies for supraventricular tachycardia including medicine and ablation were discussed in detail with the patient today. Risk, benefits, and alternatives to EP study and radiofrequency ablation were also discussed in detail today. At this time, she is reluctant to pursue ablation.   She reports having a cath in [place] years ago which was complicated by retroperitoneal bleeding as the reason for her caution. I think than a reasonable alternative is to start flecainide 50mg  BID.  She will follow-up with Dr Mayford Knife in 2 weeks.  If her arrhythmia is controlled at that time then she should proceed with GXT myoview.  IF she continues to have episodes then her flecainide may have to be increased (depending on ekg at the time) to 100mg  BID.   I will see her again in 1 month.   Today, she is in sinus tach.  I have encouraged her to follow up with Dr Mayford Knife to make sure that TFTs have been checked recently.

## 2013-01-10 NOTE — Progress Notes (Signed)
Primary Care Physician: Elby Showers, MD Referring Physician:  Dr Dawna Part is a 72 y.o. female with a h/o COPD who is seen as an urgent add on consult for tachycardia.  She reports being in her usual health state until November when she began having episodes during which she felt "nervous" and found her pulse to be elevated.  She reports having 2 episodes in November with heart rates recorded at 140-150 bpm.  Episodes were of abrupt onset but gradual termination.  She presented to Dr Mayford Knife and had an event monitor placed.  This documented frequent episodes of short RP narrow complex tachycardia.  She presented to [facility] and received adenosine which terminated tachycardia.  She has been treated with diltiazem without success.    Today, she denies symptoms of chest pain, shortness of breath (above baseline), orthopnea, PND, lower extremity edema, dizziness, presyncope, syncope, or neurologic sequela. The patient is tolerating medications without difficulties and is otherwise without complaint today.   Past Medical History  Diagnosis Date  . COPD (chronic obstructive pulmonary disease)   . SVT (supraventricular tachycardia)   . IBS (irritable bowel syndrome)   . Hypertension   . DJD (degenerative joint disease)   . RLS (restless legs syndrome)   . Hyperlipidemia   . Chronic renal insufficiency   . Endometrial cancer   . Diverticulosis    No past surgical history on file.  Current Outpatient Prescriptions  Medication Sig Dispense Refill  . ALPRAZolam (XANAX) 0.5 MG tablet Take 0.5 mg by mouth at bedtime as needed.      . cholecalciferol (VITAMIN D) 1000 UNITS tablet Take 1,000 Units by mouth daily.      Marland Kitchen diltiazem (TIAZAC) 240 MG 24 hr capsule Take 240 mg by mouth daily.      . Fluticasone-Salmeterol (ADVAIR) 250-50 MCG/DOSE AEPB Inhale 1 puff into the lungs every 12 (twelve) hours.      . multivitamin-iron-minerals-folic acid (CENTRUM) chewable tablet Chew 1  tablet by mouth daily.      . simvastatin (ZOCOR) 20 MG tablet Take 20 mg by mouth every evening.      . tiotropium (SPIRIVA) 18 MCG inhalation capsule Place 18 mcg into inhaler and inhale daily.      . flecainide (TAMBOCOR) 50 MG tablet Take 1 tablet (50 mg total) by mouth 2 (two) times daily.  180 tablet  3    Allergies  Allergen Reactions  . Biaxin (Clarithromycin) Hives  . Penicillins Hives  . Sulfa Antibiotics Rash    History   Social History  . Marital Status: Widowed    Spouse Name: N/A    Number of Children: N/A  . Years of Education: N/A   Occupational History  . Not on file.   Social History Main Topics  . Smoking status: Former Games developer  . Smokeless tobacco: Never Used  . Alcohol Use: No  . Drug Use: No  . Sexually Active: Not on file   Other Topics Concern  . Not on file   Social History Narrative  . No narrative on file    Family History  Problem Relation Age of Onset  . Uterine cancer Mother     ROS- All systems are reviewed and negative except as per the HPI above  Physical Exam: Filed Vitals:   01/10/13 1128  BP: 126/53  Pulse: 115  Height: 5' 3.5" (1.613 m)  Weight: 117 lb 6.4 oz (53.252 kg)    GEN- The patient is well  appearing, alert and oriented x 3 today.   Head- normocephalic, atraumatic Eyes-  Sclera clear, conjunctiva pink Ears- hearing intact Oropharynx- clear Neck- supple, no JVP Lymph- no cervical lymphadenopathy Lungs- Clear to ausculation bilaterally, normal work of breathing Heart- Regular rate and rhythm, no murmurs, rubs or gallops, PMI not laterally displaced GI- soft, NT, ND, + BS Extremities- no clubbing, cyanosis, or edema MS- no significant deformity or atrophy Skin- no rash or lesion Psych- euthymic mood, full affect Neuro- strength and sensation are intact  EKG today reveals sinus rhythm 115 bpm  Assessment and Plan:

## 2013-01-10 NOTE — Patient Instructions (Signed)
Your physician recommends that you schedule a follow-up appointment in: 4 weeks with Dr Johney Frame  Your physician has recommended you make the following change in your medication:  1) Start Flecainide 50mg  twice daily

## 2013-01-13 ENCOUNTER — Ambulatory Visit (INDEPENDENT_AMBULATORY_CARE_PROVIDER_SITE_OTHER)
Admission: RE | Admit: 2013-01-13 | Discharge: 2013-01-13 | Disposition: A | Discharge status: Home / Self Care | Payer: Medicare Other | Source: Ambulatory Visit | Attending: Pulmonary Disease | Admitting: Pulmonary Disease

## 2013-01-13 ENCOUNTER — Encounter: Payer: Self-pay | Admitting: Pulmonary Disease

## 2013-01-13 ENCOUNTER — Ambulatory Visit (INDEPENDENT_AMBULATORY_CARE_PROVIDER_SITE_OTHER): Payer: Medicare Other | Admitting: Pulmonary Disease

## 2013-01-13 VITALS — BP 108/56 | HR 114 | Temp 98.3°F | Ht 63.5 in | Wt 119.0 lb

## 2013-01-13 DIAGNOSIS — J449 Chronic obstructive pulmonary disease, unspecified: Secondary | ICD-10-CM | POA: Insufficient documentation

## 2013-01-13 DIAGNOSIS — R0989 Other specified symptoms and signs involving the circulatory and respiratory systems: Secondary | ICD-10-CM

## 2013-01-13 DIAGNOSIS — R0609 Other forms of dyspnea: Secondary | ICD-10-CM | POA: Diagnosis not present

## 2013-01-13 NOTE — Patient Instructions (Addendum)
Stop advair for now, and stay on spiriva Will schedule for full breathing tests either here or at [facility] as soon as we can.  I will call you once I receive results Will check a chest xray today for completeness.  Will check your oxygen levels overnight, and will discuss oxygen therapy with you at next visit.

## 2013-01-13 NOTE — Assessment & Plan Note (Signed)
The patient has dyspnea with primarily moderate to heavy exertional activities.  She does have a history of smoking, but tells me that she has never had pulmonary function studies for documentation of airflow obstruction.  She is currently on Advair and Spiriva, but is unsure if this hasn't done anything for her.  She does complain of hoarseness that I suspect is from the Advair, and with her history of tachycardia I wonder if she should be on a LABA.  I would like to continue her on Spiriva alone, and schedule for full pulmonary function studies.  Will also do a chest x-ray since she has not had one in the last few years.  We'll then make a decision about further treatment.

## 2013-01-13 NOTE — Addendum Note (Signed)
Addended by: Charlott Holler on: 01/13/2013 12:10 PM   Modules accepted: Orders

## 2013-01-13 NOTE — Progress Notes (Signed)
  Subjective:    Patient ID: Carol Carson, female    DOB: [birth year], 72 y.o.   MRN: [id number]  HPI The patient is a 72 year old female who I've been asked to see for management of COPD.  The patient tells me that she was diagnosed with COPD in 2009, but does not believe that she has ever had pulmonary function studies.  She was started on Advair and Spiriva at that time, and is unsure if that has made a difference to her breathing or not.  She feels that her dyspnea on exertion has slowly worsened since that time.  She will get winded vacuuming more than 2 rooms of her home, bringing groceries in from the car, and walking up one flight of stairs.  She does not get winded on flat ground unless she is carrying something in her hands or arms.  She has no cough or mucus production, has never had frequent bronchitis, and has never had a classic COPD exacerbation.  She does have a history of tachycardia that is currently being evaluated and treated.  The patient has a long history of smoking, but has not done so since 2000.  She has not had a recent chest x-ray.  She has had a thoracotomy in 2002 for a questionable lung mass, but tells me that it was not present at the time of the surgery.   Review of Systems  Constitutional: Negative for fever and unexpected weight change.  HENT: Positive for voice change (since lung surgery//left scar tissue) and postnasal drip. Negative for ear pain, nosebleeds, congestion, sore throat, rhinorrhea, sneezing, trouble swallowing, dental problem and sinus pressure.   Eyes: Negative for redness and itching.  Respiratory: Positive for shortness of breath and wheezing ( in throat---since lung surgery//left scar tissue). Negative for cough and chest tightness.        Dry mouth  Cardiovascular: Positive for palpitations ( A-Fib). Negative for leg swelling.  Gastrointestinal: Negative for nausea and vomiting.  Genitourinary: Negative for dysuria.  Musculoskeletal: Negative  for joint swelling.  Skin: Negative for rash.  Neurological: Negative for headaches.  Hematological: Does not bruise/bleed easily.  Psychiatric/Behavioral: Negative for dysphoric mood. The patient is nervous/anxious.        Objective:   Physical Exam Constitutional:  Well developed, no acute distress  HENT:  Nares patent without discharge  Oropharynx without exudate, palate and uvula are normal  Eyes:  Perrla, eomi, no scleral icterus  Neck:  No JVD, no TMG  Cardiovascular:  Normal rate, regular rhythm, no rubs or gallops.  No murmurs        Intact distal pulses  Pulmonary :  Mildly decreasedl breath sounds, no stridor or respiratory distress   No  rhonchi, or wheezing.  Scattered crackles throughout.   Abdominal:  Soft, nondistended, bowel sounds present.  No tenderness noted.   Musculoskeletal:  No lower extremity edema noted, mild varicosities  Lymph Nodes:  No cervical lymphadenopathy noted  Skin:  No cyanosis noted  Neurologic:  Alert, appropriate, moves all 4 extremities without obvious deficit.         Assessment & Plan:

## 2013-01-17 ENCOUNTER — Encounter: Payer: Self-pay | Admitting: Pulmonary Disease

## 2013-01-17 ENCOUNTER — Telehealth: Payer: Self-pay | Admitting: *Deleted

## 2013-01-17 ENCOUNTER — Ambulatory Visit (HOSPITAL_COMMUNITY)
Admission: RE | Admit: 2013-01-17 | Discharge: 2013-01-17 | Disposition: A | Discharge status: Home / Self Care | Payer: Medicare Other | Source: Ambulatory Visit | Attending: Pulmonary Disease | Admitting: Pulmonary Disease

## 2013-01-17 DIAGNOSIS — J988 Other specified respiratory disorders: Secondary | ICD-10-CM | POA: Diagnosis not present

## 2013-01-17 DIAGNOSIS — R0609 Other forms of dyspnea: Secondary | ICD-10-CM | POA: Insufficient documentation

## 2013-01-17 DIAGNOSIS — R0989 Other specified symptoms and signs involving the circulatory and respiratory systems: Secondary | ICD-10-CM | POA: Insufficient documentation

## 2013-01-17 LAB — PULMONARY FUNCTION TEST

## 2013-01-17 MED ORDER — ALBUTEROL SULFATE (5 MG/ML) 0.5% IN NEBU
2.5000 mg | INHALATION_SOLUTION | Freq: Once | RESPIRATORY_TRACT | Status: AC
  Administered 2013-01-17: 2.5 mg via RESPIRATORY_TRACT

## 2013-01-17 NOTE — Telephone Encounter (Signed)
Please let pt know that she does have copd by her breathing studies, and she does drop her blood oxygen level at night mildly . I would like her to come in to discuss treatment plans, especially with her heart rate issues. Will discuss in more details with her then.

## 2013-01-17 NOTE — Telephone Encounter (Addendum)
ONO results have been received and placed in your folder for review.  PFT (unconfirmed) results are also in your folder for review.

## 2013-01-18 ENCOUNTER — Institutional Professional Consult (permissible substitution): Payer: Federal, State, Local not specified - PPO | Admitting: Pulmonary Disease

## 2013-01-18 NOTE — Telephone Encounter (Signed)
Patient aware of results and recs per [facility]. Patient scheduled appt to come in 01/19/13 at 945 to discuss further treatment with [facility].

## 2013-01-19 ENCOUNTER — Encounter: Payer: Self-pay | Admitting: Pulmonary Disease

## 2013-01-19 ENCOUNTER — Ambulatory Visit (INDEPENDENT_AMBULATORY_CARE_PROVIDER_SITE_OTHER): Payer: Medicare Other | Admitting: Pulmonary Disease

## 2013-01-19 VITALS — BP 118/62 | HR 103 | Temp 97.8°F | Ht 63.5 in | Wt 118.4 lb

## 2013-01-19 DIAGNOSIS — R0989 Other specified symptoms and signs involving the circulatory and respiratory systems: Secondary | ICD-10-CM

## 2013-01-19 DIAGNOSIS — R0609 Other forms of dyspnea: Secondary | ICD-10-CM

## 2013-01-19 MED ORDER — LEVALBUTEROL TARTRATE 45 MCG/ACT IN AERO: INHALATION_SPRAY | RESPIRATORY_TRACT | Status: DC

## 2013-01-19 NOTE — Addendum Note (Signed)
Addended by: Nita Sells on: 01/19/2013 10:07 AM   Modules accepted: Orders

## 2013-01-19 NOTE — Assessment & Plan Note (Signed)
The patient has been found to have moderate to severe airflow obstruction on her pulmonary function studies, with some bronchodilator response.  She also has significant oxygen desaturation at night while sleeping.  I've had a long discussion with her about the management of COPD, including the role of bronchodilators, vaccines, oxygen, and conditioning programs.  I have highly recommended pulmonary rehabilitation, and the patient will think about it.  I have also recommended a Pneumovax and flu shot, but she will defer for now.  We'll start her on oxygen at night for her desaturation.  Regarding her bronchodilator regimen, it would be difficult to get a beta agonist on board because of her current tachycardia issues.  I would like to keep her on Xopenex for now, but once her heart rate is better controlled we'll change to arcapta.  She is to stay on Spiriva for now.  She had issues with Advair because of irritation to her throat, and I think it is unlikely that she has the phenotype which predisposes her to acute exacerbations.  She tells me that she has never required a prednisone pulse.

## 2013-01-19 NOTE — Addendum Note (Signed)
Addended by: Gweneth Dimitri D on: 01/19/2013 11:20 AM   Modules accepted: Orders

## 2013-01-19 NOTE — Progress Notes (Signed)
  Subjective:    Patient ID: Carol Carson, female    DOB: 02-Jun-1941, 72 y.o.   MRN: [id number]  HPI The patient comes in today for followup after her recent pulmonary function studies.  She was found to have moderate to severe airflow obstruction with some bronchodilator response.  She also had overnight oximetry which showed desaturation as low as 73%, and over 5 hours greater than or equal to 88%.  I have reviewed the studies with her in detail, and answered all of her questions.   Review of Systems  Constitutional: Negative for fever and unexpected weight change.  HENT: Positive for postnasal drip. Negative for ear pain, nosebleeds, congestion, sore throat, rhinorrhea, sneezing, trouble swallowing, dental problem and sinus pressure.   Eyes: Negative for redness and itching.  Respiratory: Positive for shortness of breath. Negative for cough, chest tightness and wheezing.   Cardiovascular: Negative for palpitations and leg swelling.  Gastrointestinal: Negative for nausea and vomiting.  Genitourinary: Negative for dysuria.  Musculoskeletal: Negative for joint swelling.  Skin: Negative for rash.  Neurological: Negative for headaches.  Hematological: Does not bruise/bleed easily.  Psychiatric/Behavioral: Negative for dysphoric mood. The patient is not nervous/anxious.        Objective:   Physical Exam Well-developed female in no acute distress Nose without purulence or discharge noted Neck without lymphadenopathy or thyromegaly Chest with decreased breath sounds, no wheezing Cardiac exam with regular rhythm Lower extremities without edema, cyanosis Alert and oriented, moves all 4 extremities.       Assessment & Plan:

## 2013-01-19 NOTE — Patient Instructions (Addendum)
Stay on spiriva one inhalation each day Will start on xopenex inhaler 0.63, and take 2 puffs twice a day everyday whether you need or not.  Can use 2 additional time 6 hrs apart if needed for rescue. Will start on oxygen at night Would strongly consider a pulmonary rehab program. Recommend a pneumovax and flu shot in the near future.  followup with me in 3mos, but let me know if you get your heart rate issue resolved sooner, and we can discuss alternative meds for your breathing.

## 2013-01-25 ENCOUNTER — Encounter: Payer: Self-pay | Admitting: Internal Medicine

## 2013-01-26 DIAGNOSIS — I10 Essential (primary) hypertension: Secondary | ICD-10-CM | POA: Diagnosis not present

## 2013-01-26 DIAGNOSIS — I471 Supraventricular tachycardia: Secondary | ICD-10-CM | POA: Diagnosis not present

## 2013-01-31 ENCOUNTER — Encounter: Payer: Self-pay | Admitting: Pulmonary Disease

## 2013-02-02 ENCOUNTER — Ambulatory Visit: Payer: Medicare Other | Admitting: Internal Medicine

## 2013-02-02 DIAGNOSIS — I10 Essential (primary) hypertension: Secondary | ICD-10-CM | POA: Diagnosis not present

## 2013-02-02 DIAGNOSIS — I471 Supraventricular tachycardia: Secondary | ICD-10-CM | POA: Diagnosis not present

## 2013-02-04 DIAGNOSIS — I10 Essential (primary) hypertension: Secondary | ICD-10-CM | POA: Diagnosis not present

## 2013-02-04 DIAGNOSIS — I471 Supraventricular tachycardia: Secondary | ICD-10-CM | POA: Diagnosis not present

## 2013-02-07 ENCOUNTER — Ambulatory Visit (INDEPENDENT_AMBULATORY_CARE_PROVIDER_SITE_OTHER): Payer: Medicare Other | Admitting: Internal Medicine

## 2013-02-07 VITALS — BP 125/52 | HR 84

## 2013-02-07 DIAGNOSIS — I498 Other specified cardiac arrhythmias: Secondary | ICD-10-CM | POA: Diagnosis not present

## 2013-02-07 DIAGNOSIS — I471 Supraventricular tachycardia: Secondary | ICD-10-CM

## 2013-02-07 DIAGNOSIS — I4891 Unspecified atrial fibrillation: Secondary | ICD-10-CM | POA: Diagnosis not present

## 2013-02-07 NOTE — Progress Notes (Signed)
PCP:  Elby Showers, MD  The patient presents today for routine electrophysiology followup.  She has had no sustained SVT.  She has noticed increased palpitations which she describes as an irregular pulse with "skipped beats" occuring nearly daily at night.  She feels this is worse with flecainide.  Today, she denies symptoms of  chest pain, shortness of breath, orthopnea, PND, lower extremity edema, dizziness, presyncope, syncope, or neurologic sequela.  The patient feels that she is tolerating medications without difficulties and is otherwise without complaint today.   Past Medical History  Diagnosis Date  . COPD (chronic obstructive pulmonary disease)   . SVT (supraventricular tachycardia)   . IBS (irritable bowel syndrome)   . Hypertension   . DJD (degenerative joint disease)   . RLS (restless legs syndrome)   . Hyperlipidemia   . Chronic renal insufficiency   . Endometrial cancer   . Diverticulosis   . Uterine cancer    Past Surgical History  Procedure Laterality Date  . Lung surgery  2002    golf ball sized tumor, left lung---spot vanished   . Abdominal hysterectomy      d/t unterine cancer///no chemo/no radiation    Current Outpatient Prescriptions  Medication Sig Dispense Refill  . ALPRAZolam (XANAX) 0.5 MG tablet Take 0.5 mg by mouth at bedtime as needed.      Marland Kitchen AMLODIPINE BESYLATE PO Take by mouth daily.      Marland Kitchen atenolol (TENORMIN) 25 MG tablet Take 25 mg by mouth 2 (two) times daily.      . cholecalciferol (VITAMIN D) 1000 UNITS tablet Take 1,000 Units by mouth daily.      Marland Kitchen levalbuterol (XOPENEX HFA) 45 MCG/ACT inhaler take 2 puffs twice a day everyday whether you need or not.  Can use 2 additional time 6 hrs apart if needed for rescue  1 Inhaler  3  . multivitamin-iron-minerals-folic acid (CENTRUM) chewable tablet Chew 1 tablet by mouth daily.      . simvastatin (ZOCOR) 20 MG tablet Take 20 mg by mouth every evening.      . tiotropium (SPIRIVA) 18 MCG inhalation  capsule Place 18 mcg into inhaler and inhale daily.       No current facility-administered medications for this visit.    Allergies  Allergen Reactions  . Contrast Media (Iodinated Diagnostic Agents)     LOW KIDNEY FUNCTION///NEED TO CHECK WITH NEPHROLOGIST BEFORE PROCEDURE.  . Biaxin (Clarithromycin) Hives  . Penicillins Hives  . Sulfa Antibiotics Rash    History   Social History  . Marital Status: Widowed    Spouse Name: N/A    Number of Children: N/A  . Years of Education: N/A   Occupational History  . retired    Social History Main Topics  . Smoking status: Former Smoker -- 1.30 packs/day for 43 years    Types: Cigarettes    Quit date: 12/08/1998  . Smokeless tobacco: Never Used  . Alcohol Use: No  . Drug Use: No  . Sexually Active: Not on file   Other Topics Concern  . Not on file   Social History Narrative  . No narrative on file    Family History  Problem Relation Age of Onset  . Uterine cancer Mother     ROS-  All systems are reviewed and are negative except as outlined in the HPI above  Physical Exam: Filed Vitals:   02/07/13 1227  BP: 125/52  Pulse: 84    GEN- The patient is well  appearing, alert and oriented x 3 today.   Head- normocephalic, atraumatic Eyes-  Sclera clear, conjunctiva pink Ears- hearing intact Oropharynx- clear Neck- supple, no JVP Lymph- no cervical lymphadenopathy Lungs- Clear to ausculation bilaterally, normal work of breathing Heart- Regular rate and rhythm, no murmurs, rubs or gallops, PMI not laterally displaced GI- soft, NT, ND, + BS Extremities- no clubbing, cyanosis, or edema MS- no significant deformity or atrophy Skin- no rash or lesion Psych- euthymic mood, full affect Neuro- strength and sensation are intact  ekg today reveals sinus rhythm 79 bpm, normal ekg  Assessment and Plan:

## 2013-02-07 NOTE — Patient Instructions (Signed)
Your physician recommends that you schedule a follow-up appointment in: 2 months with Dr Johney Frame  Your physician has recommended you make the following change in your medication:  1) Stop Flecainide

## 2013-02-07 NOTE — Assessment & Plan Note (Signed)
No recurrent of sustained SVT by symptoms.  She however does reports increased irregular heart beats and skipped heart beats with heart rates 70s. We had a long discussion today.  I suspect that her irregular palpitations are due to pacs/pvcs.   She is clear that she would like to stop flecainide at this time rather than increase it to 100mg  BID as I have suggested. She would like to continue atenolol and follow for recurrent. We will proceed with her request.  If she has further sustained SVT then she may be more amenable to ablation at that time.

## 2013-02-23 DIAGNOSIS — I10 Essential (primary) hypertension: Secondary | ICD-10-CM | POA: Diagnosis not present

## 2013-02-23 DIAGNOSIS — I471 Supraventricular tachycardia: Secondary | ICD-10-CM | POA: Diagnosis not present

## 2013-03-09 DIAGNOSIS — I10 Essential (primary) hypertension: Secondary | ICD-10-CM | POA: Diagnosis not present

## 2013-03-09 DIAGNOSIS — I471 Supraventricular tachycardia: Secondary | ICD-10-CM | POA: Diagnosis not present

## 2013-04-18 ENCOUNTER — Ambulatory Visit (INDEPENDENT_AMBULATORY_CARE_PROVIDER_SITE_OTHER): Payer: Medicare Other | Admitting: Internal Medicine

## 2013-04-18 VITALS — BP 104/58 | HR 82 | Ht 63.5 in | Wt 113.0 lb

## 2013-04-18 DIAGNOSIS — I498 Other specified cardiac arrhythmias: Secondary | ICD-10-CM

## 2013-04-18 DIAGNOSIS — I471 Supraventricular tachycardia, unspecified: Secondary | ICD-10-CM

## 2013-04-18 NOTE — Assessment & Plan Note (Signed)
Stable.    She would like to continue atenolol/ flecainide and follow for recurrent. If she has further sustained SVT then she may be more amenable to ablation at that time.  She will follow with Dr Mayford Knife and I will see as needed going forward

## 2013-04-18 NOTE — Progress Notes (Signed)
PCP:  Elby Showers, MD  The patient presents today for routine electrophysiology followup.  Since last being seen in our clinic, the patient reports doing very well.  Today, she denies symptoms of palpitations, chest pain, shortness of breath, orthopnea, PND, lower extremity edema, dizziness, presyncope, syncope, or neurologic sequela.  The patient feels that she is tolerating medications without difficulties and is otherwise without complaint today.   Her sister in [place] has recently had a stroke and she is planning a trip there next week.  She states that her palpitations have been increasing in frequency with her increased stress levels, but she still is reluctant to pursue therapy other than medications.   Past Medical History  Diagnosis Date  . COPD (chronic obstructive pulmonary disease)   . SVT (supraventricular tachycardia)   . IBS (irritable bowel syndrome)   . Hypertension   . DJD (degenerative joint disease)   . RLS (restless legs syndrome)   . Hyperlipidemia   . Chronic renal insufficiency   . Endometrial cancer   . Diverticulosis   . Uterine cancer    Past Surgical History  Procedure Laterality Date  . Lung surgery  2002    golf ball sized tumor, left lung---spot vanished   . Abdominal hysterectomy      d/t unterine cancer///no chemo/no radiation    Current Outpatient Prescriptions  Medication Sig Dispense Refill  . ALPRAZolam (XANAX) 0.5 MG tablet Take 0.5 mg by mouth at bedtime as needed.      Marland Kitchen AMLODIPINE BESYLATE PO Take by mouth daily.      Marland Kitchen atenolol (TENORMIN) 25 MG tablet Take 25 mg by mouth 2 (two) times daily.      . cholecalciferol (VITAMIN D) 1000 UNITS tablet Take 1,000 Units by mouth daily.      . flecainide (TAMBOCOR) 50 MG tablet       . levalbuterol (XOPENEX HFA) 45 MCG/ACT inhaler take 2 puffs twice a day everyday whether you need or not.  Can use 2 additional time 6 hrs apart if needed for rescue  1 Inhaler  3  .  multivitamin-iron-minerals-folic acid (CENTRUM) chewable tablet Chew 1 tablet by mouth daily.      . NON FORMULARY Oxygen at night      . simvastatin (ZOCOR) 20 MG tablet Take 20 mg by mouth every evening.      . tiotropium (SPIRIVA) 18 MCG inhalation capsule Place 18 mcg into inhaler and inhale daily.        Allergies  Allergen Reactions  . Contrast Media (Iodinated Diagnostic Agents)     LOW KIDNEY FUNCTION///NEED TO CHECK WITH NEPHROLOGIST BEFORE PROCEDURE.  . Biaxin (Clarithromycin) Hives  . Penicillins Hives  . Sulfa Antibiotics Rash    History   Social History  . Marital Status: Widowed    Spouse Name: N/A    Number of Children: N/A  . Years of Education: N/A   Occupational History  . retired    Social History Main Topics  . Smoking status: Former Smoker -- 1.30 packs/day for 43 years    Types: Cigarettes    Quit date: 12/08/1998  . Smokeless tobacco: Never Used  . Alcohol Use: No  . Drug Use: No  . Sexually Active: Not on file   Other Topics Concern  . Not on file   Social History Narrative  . No narrative on file    Family History  Problem Relation Age of Onset  . Uterine cancer Mother  Physical Exam: Filed Vitals:   04/18/13 1111  BP: 104/58  Pulse: 82  Height: 5' 3.5" (1.613 m)  Weight: 113 lb (51.256 kg)  SpO2: 83%    GEN- The patient is well appearing, alert and oriented x 3 today.   Head- normocephalic, atraumatic Eyes-  Sclera clear, conjunctiva pink Ears- hearing intact Oropharynx- clear Neck- supple, no JVP Lymph- no cervical lymphadenopathy Lungs- Clear to ausculation bilaterally, normal work of breathing Heart- Regular rate and rhythm, no murmurs, rubs or gallops, PMI not laterally displaced GI- soft, NT, ND, + BS Extremities- no clubbing, cyanosis, or edema MS- no significant deformity or atrophy Skin- no rash or lesion Psych- euthymic mood, full affect Neuro- strength and sensation are intact  ekg today reveals sinus  rhythm 78 bpm, PR 206, rsR'  Assessment and Plan:

## 2013-04-18 NOTE — Patient Instructions (Addendum)
We will see you as needed.  Call with problems.

## 2013-04-20 ENCOUNTER — Ambulatory Visit (INDEPENDENT_AMBULATORY_CARE_PROVIDER_SITE_OTHER): Payer: Medicare Other | Admitting: Pulmonary Disease

## 2013-04-20 ENCOUNTER — Encounter: Payer: Self-pay | Admitting: Pulmonary Disease

## 2013-04-20 VITALS — BP 120/60 | HR 82 | Temp 98.5°F | Ht 62.5 in | Wt 114.4 lb

## 2013-04-20 DIAGNOSIS — J438 Other emphysema: Secondary | ICD-10-CM

## 2013-04-20 DIAGNOSIS — J439 Emphysema, unspecified: Secondary | ICD-10-CM

## 2013-04-20 NOTE — Progress Notes (Signed)
  Subjective:    Patient ID: Carol Carson, female    DOB: December 08, 1941, 72 y.o.   MRN: [id number]  HPI The patient comes in today for followup of her known COPD.  She has been on Spiriva, and Xopenex was added to her regimen at the last visit to see if it would improve her breathing.  She is unable to take albuterol, and has been hesitant to take LABA because of her history of arrhythmias.  She has really not seen a big difference in her breathing since the last visit.    Review of Systems  Constitutional: Negative for fever and unexpected weight change.  HENT: Negative for ear pain, nosebleeds, congestion, sore throat, rhinorrhea, sneezing, trouble swallowing, dental problem, postnasal drip and sinus pressure.   Eyes: Negative for redness and itching.  Respiratory: Positive for shortness of breath. Negative for cough, chest tightness and wheezing.   Cardiovascular: Negative for palpitations and leg swelling.  Gastrointestinal: Negative for nausea and vomiting.  Genitourinary: Negative for dysuria.  Musculoskeletal: Negative for joint swelling.  Skin: Negative for rash.  Neurological: Positive for dizziness ( pt reports vertigo---pt states that her sister just had a stroke. ). Negative for headaches.  Hematological: Does not bruise/bleed easily.  Psychiatric/Behavioral: Negative for dysphoric mood. The patient is not nervous/anxious.        Objective:   Physical Exam Thin female in no acute distress Nose without purulence or discharge noted Neck without lymphadenopathy or thyromegaly Chest with decreased breath sounds throughout, a few scattered crackles, no wheezing Cardiac exam was regular rate and rhythm Lower extremities without edema, no cyanosis Alert and oriented, moves all 4 extremities.       Assessment & Plan:

## 2013-04-20 NOTE — Assessment & Plan Note (Signed)
The patient did not see any difference in her breathing on Spiriva with Xopenex 2-4 times a day.  She tells me that her heart rate is now under better control, and I think it is worth trying a LABA.  We'll discontinue Spiriva, and try her on Anora.  The patient will let me know how this goes.  I have asked her to stay as active as possible, and to continue on her oxygen while sleeping.

## 2013-04-20 NOTE — Patient Instructions (Addendum)
Stop spiriva for now, and try ANORO one inhalation each am for the next 4 weeks.  Keep mouth rinsed.  After 4 weeks, decide if the new medication has made a difference in your breathing.  Let me know if you need a prescription. Do not take xopenex except for rescue. Stay as active as possible. followup with me in 6mos.

## 2013-05-11 DIAGNOSIS — I471 Supraventricular tachycardia: Secondary | ICD-10-CM | POA: Diagnosis not present

## 2013-05-11 DIAGNOSIS — I10 Essential (primary) hypertension: Secondary | ICD-10-CM | POA: Diagnosis not present

## 2013-05-13 DIAGNOSIS — R42 Dizziness and giddiness: Secondary | ICD-10-CM | POA: Diagnosis not present

## 2013-05-13 DIAGNOSIS — I10 Essential (primary) hypertension: Secondary | ICD-10-CM | POA: Diagnosis not present

## 2013-05-13 DIAGNOSIS — F410 Panic disorder [episodic paroxysmal anxiety] without agoraphobia: Secondary | ICD-10-CM | POA: Diagnosis not present

## 2013-05-16 ENCOUNTER — Telehealth: Payer: Self-pay | Admitting: Pulmonary Disease

## 2013-05-16 MED ORDER — UMECLIDINIUM-VILANTEROL 62.5-25 MCG/INH IN AEPB: 1.0000 | INHALATION_SPRAY | Freq: Every day | RESPIRATORY_TRACT | Status: DC

## 2013-05-16 NOTE — Telephone Encounter (Signed)
Ok with me 

## 2013-05-16 NOTE — Telephone Encounter (Signed)
I spoke with pt. She stated the anoro has improved her breathing. She stated she is breathing better than she has in years. She is requesting 90 day rx sent to [facility]. Please advise KC thanks

## 2013-05-16 NOTE — Telephone Encounter (Signed)
Anoro rx sent to [facility].  Pt aware and voiced no further questions or concerns at this time.

## 2013-06-20 ENCOUNTER — Telehealth: Payer: Self-pay | Admitting: Pulmonary Disease

## 2013-06-20 NOTE — Telephone Encounter (Signed)
No need for message. °

## 2013-07-13 ENCOUNTER — Other Ambulatory Visit: Payer: Self-pay

## 2013-07-22 ENCOUNTER — Ambulatory Visit: Payer: Medicare Other | Admitting: Internal Medicine

## 2013-08-05 ENCOUNTER — Ambulatory Visit (INDEPENDENT_AMBULATORY_CARE_PROVIDER_SITE_OTHER): Payer: Medicare Other | Admitting: Pulmonary Disease

## 2013-08-05 ENCOUNTER — Encounter: Payer: Self-pay | Admitting: Pulmonary Disease

## 2013-08-05 VITALS — BP 120/78 | HR 88 | Ht 63.0 in | Wt 111.0 lb

## 2013-08-05 DIAGNOSIS — R0902 Hypoxemia: Secondary | ICD-10-CM

## 2013-08-05 DIAGNOSIS — J441 Chronic obstructive pulmonary disease with (acute) exacerbation: Secondary | ICD-10-CM

## 2013-08-05 DIAGNOSIS — J439 Emphysema, unspecified: Secondary | ICD-10-CM

## 2013-08-05 DIAGNOSIS — J438 Other emphysema: Secondary | ICD-10-CM

## 2013-08-05 DIAGNOSIS — J9621 Acute and chronic respiratory failure with hypoxia: Secondary | ICD-10-CM | POA: Insufficient documentation

## 2013-08-05 DIAGNOSIS — R0602 Shortness of breath: Secondary | ICD-10-CM | POA: Diagnosis not present

## 2013-08-05 DIAGNOSIS — G4734 Idiopathic sleep related nonobstructive alveolar hypoventilation: Secondary | ICD-10-CM

## 2013-08-05 MED ORDER — PREDNISONE 10 MG PO TABS: ORAL_TABLET | ORAL | Status: DC

## 2013-08-05 MED ORDER — LEVALBUTEROL HCL 0.63 MG/3ML IN NEBU
0.6300 mg | INHALATION_SOLUTION | Freq: Once | RESPIRATORY_TRACT | Status: AC
  Administered 2013-08-05: 0.63 mg via RESPIRATORY_TRACT

## 2013-08-05 MED ORDER — DOXYCYCLINE HYCLATE 100 MG PO TABS: 100.0000 mg | ORAL_TABLET | Freq: Two times a day (BID) | ORAL | Status: DC

## 2013-08-05 NOTE — Patient Instructions (Signed)
Stop anoro Spiriva one puff daily Xopenex two puffs as needed up to four times per day Doxycycline 100 mg pill >> 1 pill twice daily for 7 days Prednisone 10 mg pill >> 3 pills daily for 2 days, 2 pills daily for 2 days, 1 pill daily for 2 days Follow up in 2 weeks with Dr. Shelle Iron or Tammy Parrett

## 2013-08-05 NOTE — Assessment & Plan Note (Signed)
She was not able to tolerate anoro.  Will have her resume spiriva, and continue xopenex.  Further adjustments to be determined at her follow up visit.

## 2013-08-05 NOTE — Progress Notes (Signed)
Chief Complaint  Patient presents with  . Acute Visit    Reports SOB. Onset was 1 week ago. Can't get a complete deep breath. Currently on 2lpm QHS.    History of Present Illness: Carol Carson is a 72 y.o. female with COPD.  She is followed by Dr. Shelle Iron.  She is here for an acute visit.  She was doing well until 6 days ago.  She was at church, and noticed she was having trouble singing.  She felt like she couldn't get air into her lungs.  This has gotten progressively worse.  She has been getting lots of wheezing and coughing with yellow sputum.  Her chest feels tight.  She denies sinus congestion, ear pain, or sore throat.  She has not coughed blood.  She denies abdominal symptoms.  She is not having leg swelling.  She denies sick exposures.  She is not sure if she had fever.  She has not been on antibiotics or prednisone recently.  She was started on anoro at last visit with Dr. Shelle Iron.  She gets coughing spells each time after she uses this, and does not feel this has helped much.  She liked using spiriva better.  She has xopenex, and has been using this more frequently over the past few days.  She has been using 2 liters oxygen at night.  Carol Carson  has a past medical history of COPD (chronic obstructive pulmonary disease); SVT (supraventricular tachycardia); IBS (irritable bowel syndrome); Hypertension; DJD (degenerative joint disease); RLS (restless legs syndrome); Hyperlipidemia; Chronic renal insufficiency; Endometrial cancer; Diverticulosis; and Uterine cancer.  Carol Carson  has past surgical history that includes Lung surgery (2002) and Abdominal hysterectomy.  Prior to Admission medications   Medication Sig Start Date End Date Taking? Authorizing Provider  ALPRAZolam Prudy Feeler) 0.5 MG tablet Take 0.5 mg by mouth at bedtime as needed.   Yes Historical Provider, MD  AMLODIPINE BESYLATE PO Take by mouth daily.   Yes Historical Provider, MD  atenolol (TENORMIN) 25 MG  tablet Take 25 mg by mouth 2 (two) times daily.   Yes Historical Provider, MD  cholecalciferol (VITAMIN D) 1000 UNITS tablet Take 1,000 Units by mouth daily.   Yes Historical Provider, MD  flecainide (TAMBOCOR) 50 MG tablet  04/13/13  Yes Historical Provider, MD  levalbuterol (XOPENEX HFA) 45 MCG/ACT inhaler take 2 puffs twice a day everyday whether you need or not.  Can use 2 additional time 6 hrs apart if needed for rescue 01/19/13  Yes Barbaraann Share, MD  Multiple Vitamins-Minerals (CENTRUM SILVER PO) Take 1 tablet by mouth daily.   Yes Historical Provider, MD  NON FORMULARY Oxygen at night   Yes Historical Provider, MD  simvastatin (ZOCOR) 20 MG tablet Take 20 mg by mouth every evening.   Yes Historical Provider, MD  tiotropium (SPIRIVA) 18 MCG inhalation capsule Place 18 mcg into inhaler and inhale daily.   Yes Historical Provider, MD  Umeclidinium-Vilanterol (ANORO ELLIPTA) 62.5-25 MCG/INH AEPB Inhale 1 puff into the lungs daily. 05/16/13  Yes Barbaraann Share, MD    Allergies  Allergen Reactions  . Contrast Media [Iodinated Diagnostic Agents]     LOW KIDNEY FUNCTION///NEED TO CHECK WITH NEPHROLOGIST BEFORE PROCEDURE.  . Biaxin [Clarithromycin] Hives  . Penicillins Hives  . Sulfa Antibiotics Rash     Physical Exam:  General - No distress ENT - No sinus tenderness, no oral exudate, no LAN Cardiac - s1s2 regular, no murmur Chest - Decreased breath sounds  with poor air movement and faint wheeze >> improved air movement after nebulizer treatment Back - No focal tenderness Abd - Soft, non-tender Ext - No edema Neuro - Normal strength Skin - No rashes Psych - normal mood, and behavior   Assessment/Plan:  Coralyn Helling, MD [facility] Pager:  [phone]

## 2013-08-05 NOTE — Assessment & Plan Note (Signed)
She has exacerbation.  Will give course of prednisone and doxycycline.

## 2013-08-19 ENCOUNTER — Ambulatory Visit: Payer: Medicare Other | Admitting: Adult Health

## 2013-08-19 DIAGNOSIS — Z23 Encounter for immunization: Secondary | ICD-10-CM | POA: Diagnosis not present

## 2013-08-19 DIAGNOSIS — Z0000 Encounter for general adult medical examination without abnormal findings: Secondary | ICD-10-CM | POA: Diagnosis not present

## 2013-08-19 DIAGNOSIS — Z1239 Encounter for other screening for malignant neoplasm of breast: Secondary | ICD-10-CM | POA: Diagnosis not present

## 2013-08-19 DIAGNOSIS — I10 Essential (primary) hypertension: Secondary | ICD-10-CM | POA: Diagnosis not present

## 2013-08-19 DIAGNOSIS — E785 Hyperlipidemia, unspecified: Secondary | ICD-10-CM | POA: Diagnosis not present

## 2013-08-19 DIAGNOSIS — Z1331 Encounter for screening for depression: Secondary | ICD-10-CM | POA: Diagnosis not present

## 2013-08-19 DIAGNOSIS — F410 Panic disorder [episodic paroxysmal anxiety] without agoraphobia: Secondary | ICD-10-CM | POA: Diagnosis not present

## 2013-08-22 ENCOUNTER — Ambulatory Visit (INDEPENDENT_AMBULATORY_CARE_PROVIDER_SITE_OTHER): Payer: Medicare Other | Admitting: Adult Health

## 2013-08-22 ENCOUNTER — Encounter: Payer: Self-pay | Admitting: Adult Health

## 2013-08-22 VITALS — BP 112/60 | HR 87 | Temp 97.9°F | Ht 63.0 in | Wt 109.8 lb

## 2013-08-22 DIAGNOSIS — J438 Other emphysema: Secondary | ICD-10-CM

## 2013-08-22 DIAGNOSIS — J439 Emphysema, unspecified: Secondary | ICD-10-CM

## 2013-08-22 NOTE — Progress Notes (Signed)
  Subjective:    Patient ID: Carol Carson, female    DOB: Aug 19, 1941, 72 y.o.   MRN: [id number]  HPI 72 yo female with known hx of COPD   08/22/2013 Follow up  Returns for a 2 week follow up for COPD flare  Tx with doxycycline and steroid taper .  She is feeling much better . Breathing has returned to her baseline.  She was recently changed from Spiriva to Anoro , but could not tolerate d/t to mouth irritation. Now back on Spiriva . Feeling better.  Complains of dry nose and stuffiness, had couple of nose bleeds since starting on nocturnal O2.      Review of Systems  Constitutional:   No  weight loss, night sweats,  Fevers, chills, fatigue, or  lassitude.  HEENT:   No headaches,  Difficulty swallowing,  Tooth/dental problems, or  Sore throat,                No sneezing, itching, ear ache, nasal congestion, post nasal drip,   CV:  No chest pain,  Orthopnea, PND, swelling in lower extremities, anasarca, dizziness, palpitations, syncope.   GI  No heartburn, indigestion, abdominal pain, nausea, vomiting, diarrhea, change in bowel habits, loss of appetite, bloody stools.   Resp:   No coughing up of blood.  No change in color of mucus.  No wheezing.  No chest wall deformity  Skin: no rash or lesions.  GU: no dysuria, change in color of urine, no urgency or frequency.  No flank pain, no hematuria   MS:  No joint pain or swelling.  No decreased range of motion.  No back pain.  Psych:  No change in mood or affect. No depression or anxiety.  No memory loss.           Objective:   Physical Exam GEN: A/Ox3; pleasant , NAD,elderly thin  HEENT:  [place]/AT,  EACs-clear, TMs-wnl, NOSE-clear, THROAT-clear, no lesions, no postnasal drip or exudate noted.   NECK:  Supple w/ fair ROM; no JVD; normal carotid impulses w/o bruits; no thyromegaly or nodules palpated; no lymphadenopathy.  RESP  Diminished BS in bases no accessory muscle use, no dullness to percussion  CARD:  RRR, no m/r/g  ,  no peripheral edema, pulses intact, no cyanosis or clubbing.  GI:   Soft & nt; nml bowel sounds; no organomegaly or masses detected.  Musco: Warm bil, no deformities or joint swelling noted.   Neuro: alert, no focal deficits noted.    Skin: Warm, no lesions or rashes         Assessment & Plan:

## 2013-08-22 NOTE — Assessment & Plan Note (Signed)
Recent flare now resolved, back to her baseline   Plan  Continue on Spiriva daily .  Saline nasal rinses twice daily   May use Saline nasal gel -"Ayr" As needed  For nasal congestion.  Follow up Dr. Shelle Iron in 2 months as planned and As needed

## 2013-08-22 NOTE — Patient Instructions (Addendum)
Continue on Spiriva daily .  Saline nasal rinses twice daily   May use Saline nasal gel -"Ayr" As needed  For nasal congestion.  Follow up Dr. Shelle Iron in 2 months as planned and As needed

## 2013-08-22 NOTE — Progress Notes (Signed)
Ov reviewed, and agree with plan as outlined.  

## 2013-09-13 DIAGNOSIS — D649 Anemia, unspecified: Secondary | ICD-10-CM | POA: Diagnosis not present

## 2013-09-13 DIAGNOSIS — N2581 Secondary hyperparathyroidism of renal origin: Secondary | ICD-10-CM | POA: Diagnosis not present

## 2013-10-21 ENCOUNTER — Encounter: Payer: Self-pay | Admitting: Pulmonary Disease

## 2013-10-21 ENCOUNTER — Ambulatory Visit (INDEPENDENT_AMBULATORY_CARE_PROVIDER_SITE_OTHER): Payer: Medicare Other | Admitting: Pulmonary Disease

## 2013-10-21 VITALS — BP 112/70 | HR 95 | Temp 97.9°F | Ht 63.0 in | Wt 108.0 lb

## 2013-10-21 DIAGNOSIS — Z23 Encounter for immunization: Secondary | ICD-10-CM

## 2013-10-21 DIAGNOSIS — J438 Other emphysema: Secondary | ICD-10-CM | POA: Diagnosis not present

## 2013-10-21 DIAGNOSIS — J439 Emphysema, unspecified: Secondary | ICD-10-CM

## 2013-10-21 NOTE — Progress Notes (Signed)
  Subjective:    Patient ID: Carol Carson, female    DOB: May 22, 1941, 72 y.o.   MRN: [id number]  HPI The patient comes in today for followup of her known COPD.  She has done well overall since the last visit, but did have an acute exacerbation in August which responded well to antibiotics and steroids.  She was not able to tolerate anoro, and therefore is on Spiriva alone currently.  She feels that her breathing is at baseline, and has no significant cough or chest congestion.   Review of Systems  Constitutional: Negative for fever and unexpected weight change.  HENT: Negative for congestion, dental problem, ear pain, nosebleeds, postnasal drip, rhinorrhea, sinus pressure, sneezing, sore throat and trouble swallowing.   Eyes: Negative for redness and itching.  Respiratory: Negative for cough, chest tightness, shortness of breath and wheezing.   Cardiovascular: Negative for palpitations and leg swelling.  Gastrointestinal: Negative for nausea and vomiting.  Genitourinary: Negative for dysuria.  Musculoskeletal: Negative for joint swelling.  Skin: Negative for rash.  Neurological: Negative for headaches.  Hematological: Does not bruise/bleed easily.  Psychiatric/Behavioral: Negative for dysphoric mood. The patient is not nervous/anxious.        Objective:   Physical Exam Thin female in no acute distress Nose without purulence or discharge noted Neck without lymphadenopathy or thyromegaly Chest with decreased breath sounds, no wheezing Cardiac exam with regular rate and rhythm Lower extremities without edema, no cyanosis Alert and oriented, moves all 4 extremities.       Assessment & Plan:

## 2013-10-21 NOTE — Patient Instructions (Signed)
No change in medications Stay as active as possible. Will give you the flu shot today. followup with me in one year.

## 2013-10-21 NOTE — Assessment & Plan Note (Signed)
The patient appears to be fairly stable from a pulmonary standpoint on her current bronchodilator regimen.  She did have a recent exacerbation in August, but now has returned to baseline.  She was not able to tolerate anoro, would consider adding a different LABA if she continues to have issues.  I have asked her to stay as active as possible, and we'll also give her the flu shot today.

## 2013-10-27 ENCOUNTER — Telehealth: Payer: Self-pay | Admitting: Pulmonary Disease

## 2013-10-27 NOTE — Telephone Encounter (Signed)
Handycap Placard is in Gianelle Mccaul folder to be completed. Please return to Mykell Rawl once completed. Thanks.

## 2013-11-10 ENCOUNTER — Other Ambulatory Visit: Payer: Self-pay | Admitting: General Surgery

## 2013-11-10 ENCOUNTER — Ambulatory Visit (INDEPENDENT_AMBULATORY_CARE_PROVIDER_SITE_OTHER): Payer: Medicare Other | Admitting: Cardiology

## 2013-11-10 ENCOUNTER — Encounter: Payer: Self-pay | Admitting: Cardiology

## 2013-11-10 VITALS — BP 110/56 | HR 78 | Ht 63.0 in | Wt 107.0 lb

## 2013-11-10 DIAGNOSIS — I471 Supraventricular tachycardia: Secondary | ICD-10-CM

## 2013-11-10 DIAGNOSIS — I498 Other specified cardiac arrhythmias: Secondary | ICD-10-CM

## 2013-11-10 DIAGNOSIS — I10 Essential (primary) hypertension: Secondary | ICD-10-CM

## 2013-11-10 NOTE — Patient Instructions (Signed)
Your physician recommends that you continue on your current medications as directed. Please refer to the Current Medication list given to you today.  Your physician wants you to follow-up in: 6 Months with Dr Turner You will receive a reminder letter in the mail two months in advance. If you don't receive a letter, please call our office to schedule the follow-up appointment.  

## 2013-11-10 NOTE — Progress Notes (Signed)
[address] [place], [place]  [address] Phone: [phone] Fax:  [phone]  Date:  11/10/2013   ID:  Carol Carson, DOB [birth year], MRN [id number]  PCP:  Elby Showers, MD  Cardiologist:  Armanda Magic, MD     History of Present Illness: Carol Carson is a 72 y.o. female with a history of SVT and HTN who presents today for followup.  She is doing well.  She denies any chest pain, LE edema, palpitations or syncope.  She has chronic SOB due to COPD which is stable.  She has had some problems with vertigo.     Wt Readings from Last 3 Encounters:  11/10/13 107 lb (48.535 kg)  10/21/13 108 lb (48.988 kg)  08/22/13 109 lb 12.8 oz (49.805 kg)     Past Medical History  Diagnosis Date  . COPD (chronic obstructive pulmonary disease)   . SVT (supraventricular tachycardia)   . IBS (irritable bowel syndrome)   . Hypertension   . DJD (degenerative joint disease)   . RLS (restless legs syndrome)   . Hyperlipidemia   . Chronic renal insufficiency   . Endometrial cancer   . Diverticulosis   . Uterine cancer     Current Outpatient Prescriptions  Medication Sig Dispense Refill  . ALPRAZolam (XANAX) 0.5 MG tablet Take 0.5 mg by mouth at bedtime as needed.      Marland Kitchen AMLODIPINE BESYLATE PO Take by mouth daily.      Ailene Ards ELLIPTA 62.5-25 MCG/INH AEPB as needed. 1 puff      . atenolol (TENORMIN) 25 MG tablet Take 25 mg by mouth 2 (two) times daily.      . cholecalciferol (VITAMIN D) 1000 UNITS tablet Take 1,000 Units by mouth daily.      . flecainide (TAMBOCOR) 50 MG tablet Take 50 mg by mouth 2 (two) times daily.       Marland Kitchen levalbuterol (XOPENEX HFA) 45 MCG/ACT inhaler take 2 puffs twice a day everyday whether you need or not.  Can use 2 additional time 6 hrs apart if needed for rescue  1 Inhaler  3  . Multiple Vitamins-Minerals (CENTRUM SILVER PO) Take 1 tablet by mouth daily.      . NON FORMULARY Oxygen at night      . simvastatin (ZOCOR) 20 MG tablet Take 20 mg by mouth  every evening.      . tiotropium (SPIRIVA) 18 MCG inhalation capsule Place 18 mcg into inhaler and inhale daily.       No current facility-administered medications for this visit.    Allergies:    Allergies  Allergen Reactions  . Contrast Media [Iodinated Diagnostic Agents]     LOW KIDNEY FUNCTION///NEED TO CHECK WITH NEPHROLOGIST BEFORE PROCEDURE.  . Biaxin [Clarithromycin] Hives  . Penicillins Hives  . Sulfa Antibiotics Rash    Social History:  The patient  reports that she quit smoking about 14 years ago. Her smoking use included Cigarettes. She has a 55.9 pack-year smoking history. She has never used smokeless tobacco. She reports that she does not drink alcohol or use illicit drugs.   Family History:  The patient's family history includes Uterine cancer in her mother.   ROS:  Please see the history of present illness.      All other systems reviewed and negative.   PHYSICAL EXAM: VS:  BP 110/56  Pulse 78  Ht 5\' 3"  (1.6 m)  Wt 107 lb (48.535 kg)  BMI 18.96 kg/m2 Well nourished,  well developed, in no acute distress HEENT: normal Neck: no JVD Cardiac:  normal S1, S2; RRR; no murmur Lungs:  clear to auscultation bilaterally, no wheezing, rhonchi or rales Abd: soft, nontender, no hepatomegaly Ext: no edema Skin: warm and dry Neuro:  CNs 2-12 intact, no focal abnormalities noted  EKG:  NSR with no ST changes     ASSESSMENT AND PLAN:  1. SVT with no reoccurrence   - continue Flecainide and Atenolol 2. HTN - well controlled  - continue Amlodipine/Atenolol  Followup with me in 6 months  Signed, Armanda Magic, MD 11/10/2013 10:51 AM

## 2013-11-25 DIAGNOSIS — H53009 Unspecified amblyopia, unspecified eye: Secondary | ICD-10-CM | POA: Diagnosis not present

## 2013-11-25 DIAGNOSIS — H43819 Vitreous degeneration, unspecified eye: Secondary | ICD-10-CM | POA: Diagnosis not present

## 2013-11-25 DIAGNOSIS — H35379 Puckering of macula, unspecified eye: Secondary | ICD-10-CM | POA: Diagnosis not present

## 2013-11-25 DIAGNOSIS — Z961 Presence of intraocular lens: Secondary | ICD-10-CM | POA: Diagnosis not present

## 2013-12-13 ENCOUNTER — Other Ambulatory Visit: Payer: Self-pay | Admitting: Pulmonary Disease

## 2014-01-31 ENCOUNTER — Other Ambulatory Visit: Payer: Self-pay | Admitting: General Surgery

## 2014-01-31 ENCOUNTER — Telehealth: Payer: Self-pay | Admitting: Cardiology

## 2014-01-31 MED ORDER — FLECAINIDE ACETATE 50 MG PO TABS: 50.0000 mg | ORAL_TABLET | Freq: Two times a day (BID) | ORAL | Status: DC

## 2014-01-31 NOTE — Telephone Encounter (Signed)
Rx sent in for pt.

## 2014-01-31 NOTE — Telephone Encounter (Signed)
New message    Refill on flecainide 50 mg

## 2014-02-07 DIAGNOSIS — L259 Unspecified contact dermatitis, unspecified cause: Secondary | ICD-10-CM | POA: Diagnosis not present

## 2014-02-07 DIAGNOSIS — D485 Neoplasm of uncertain behavior of skin: Secondary | ICD-10-CM | POA: Diagnosis not present

## 2014-02-14 DIAGNOSIS — L723 Sebaceous cyst: Secondary | ICD-10-CM | POA: Diagnosis not present

## 2014-02-17 ENCOUNTER — Telehealth: Payer: Self-pay | Admitting: Pulmonary Disease

## 2014-02-17 DIAGNOSIS — I129 Hypertensive chronic kidney disease with stage 1 through stage 4 chronic kidney disease, or unspecified chronic kidney disease: Secondary | ICD-10-CM | POA: Diagnosis not present

## 2014-02-17 DIAGNOSIS — F411 Generalized anxiety disorder: Secondary | ICD-10-CM | POA: Diagnosis not present

## 2014-02-17 DIAGNOSIS — N183 Chronic kidney disease, stage 3 unspecified: Secondary | ICD-10-CM | POA: Diagnosis not present

## 2014-02-17 DIAGNOSIS — J449 Chronic obstructive pulmonary disease, unspecified: Secondary | ICD-10-CM | POA: Diagnosis not present

## 2014-02-17 MED ORDER — UMECLIDINIUM-VILANTEROL 62.5-25 MCG/INH IN AEPB: 1.0000 | INHALATION_SPRAY | Freq: Every day | RESPIRATORY_TRACT | Status: DC

## 2014-02-17 NOTE — Telephone Encounter (Signed)
Spoke with pt and aware RX has been called in. Nothing further needed

## 2014-03-14 DIAGNOSIS — R141 Gas pain: Secondary | ICD-10-CM | POA: Diagnosis not present

## 2014-03-14 DIAGNOSIS — R198 Other specified symptoms and signs involving the digestive system and abdomen: Secondary | ICD-10-CM | POA: Diagnosis not present

## 2014-03-16 DIAGNOSIS — H60399 Other infective otitis externa, unspecified ear: Secondary | ICD-10-CM | POA: Diagnosis not present

## 2014-03-22 DIAGNOSIS — N2581 Secondary hyperparathyroidism of renal origin: Secondary | ICD-10-CM | POA: Diagnosis not present

## 2014-03-22 DIAGNOSIS — D631 Anemia in chronic kidney disease: Secondary | ICD-10-CM | POA: Diagnosis not present

## 2014-03-22 DIAGNOSIS — R809 Proteinuria, unspecified: Secondary | ICD-10-CM | POA: Diagnosis not present

## 2014-03-22 DIAGNOSIS — N183 Chronic kidney disease, stage 3 unspecified: Secondary | ICD-10-CM | POA: Diagnosis not present

## 2014-03-22 DIAGNOSIS — E785 Hyperlipidemia, unspecified: Secondary | ICD-10-CM | POA: Diagnosis not present

## 2014-04-11 DIAGNOSIS — R198 Other specified symptoms and signs involving the digestive system and abdomen: Secondary | ICD-10-CM | POA: Diagnosis not present

## 2014-04-11 DIAGNOSIS — R141 Gas pain: Secondary | ICD-10-CM | POA: Diagnosis not present

## 2014-04-11 DIAGNOSIS — R143 Flatulence: Secondary | ICD-10-CM | POA: Diagnosis not present

## 2014-05-17 DIAGNOSIS — L723 Sebaceous cyst: Secondary | ICD-10-CM | POA: Diagnosis not present

## 2014-05-17 DIAGNOSIS — L259 Unspecified contact dermatitis, unspecified cause: Secondary | ICD-10-CM | POA: Diagnosis not present

## 2014-05-30 DIAGNOSIS — N183 Chronic kidney disease, stage 3 unspecified: Secondary | ICD-10-CM | POA: Diagnosis not present

## 2014-06-16 ENCOUNTER — Other Ambulatory Visit: Payer: Self-pay | Admitting: Pulmonary Disease

## 2014-06-29 ENCOUNTER — Other Ambulatory Visit: Payer: Self-pay | Admitting: Dermatology

## 2014-06-29 ENCOUNTER — Telehealth: Payer: Self-pay | Admitting: Pulmonary Disease

## 2014-06-29 DIAGNOSIS — D485 Neoplasm of uncertain behavior of skin: Secondary | ICD-10-CM | POA: Diagnosis not present

## 2014-06-29 DIAGNOSIS — L820 Inflamed seborrheic keratosis: Secondary | ICD-10-CM | POA: Diagnosis not present

## 2014-06-29 DIAGNOSIS — L500 Allergic urticaria: Secondary | ICD-10-CM | POA: Diagnosis not present

## 2014-06-29 DIAGNOSIS — L509 Urticaria, unspecified: Secondary | ICD-10-CM | POA: Diagnosis not present

## 2014-06-29 NOTE — Telephone Encounter (Signed)
Called spoke with pt. Aware of recs.  Nothing further needed 

## 2014-06-29 NOTE — Telephone Encounter (Signed)
Spoke with the pt  She states that she was prescribed hydroxyzine for hives  She is afraid to take med b/c she has COPD and has read that this med can cause more breathing problems in pt's with COPD  Since [place] is out of the office this wk and next wk will forward to doc of the day for advise on this  Dr Annamaria Boots, please advise, thanks! Allergies  Allergen Reactions  . Contrast Media [Iodinated Diagnostic Agents]     LOW KIDNEY FUNCTION///NEED TO CHECK WITH NEPHROLOGIST BEFORE PROCEDURE.  . Biaxin [Clarithromycin] Hives  . Penicillins Hives  . Sulfa Antibiotics Rash   Current Outpatient Prescriptions on File Prior to Visit  Medication Sig Dispense Refill  . ALPRAZolam (XANAX) 0.5 MG tablet Take 0.5 mg by mouth at bedtime as needed.      Marland Kitchen AMLODIPINE BESYLATE PO Take 5 mg by mouth daily.       Marland Kitchen atenolol (TENORMIN) 25 MG tablet Take 25 mg by mouth 2 (two) times daily.      . cholecalciferol (VITAMIN D) 1000 UNITS tablet Take 1,000 Units by mouth daily.      . flecainide (TAMBOCOR) 50 MG tablet Take 1 tablet (50 mg total) by mouth 2 (two) times daily.  60 tablet  5  . Multiple Vitamins-Minerals (CENTRUM SILVER PO) Take 1 tablet by mouth daily.      . NON FORMULARY Oxygen at night      . simvastatin (ZOCOR) 20 MG tablet Take 20 mg by mouth every evening.      . tiotropium (SPIRIVA) 18 MCG inhalation capsule Place 18 mcg into inhaler and inhale daily.      Marland Kitchen Umeclidinium-Vilanterol (ANORO ELLIPTA) 62.5-25 MCG/INH AEPB Inhale 1 puff into the lungs daily.  180 each  3  . XOPENEX HFA 45 MCG/ACT inhaler INHALE 2 PUFFS BY MOUTH EVERY DAY( WHETHER YOU NEED IT OR NOT) CAN USE 2 ADDITIONAL TIMES 6 HOURS APART AS NEEDED  15 g  6   No current facility-administered medications on file prior to visit.

## 2014-06-29 NOTE — Telephone Encounter (Signed)
If she hasn't already tried it- would have her try daily Zyrtec and daily Pepcid otc, which are H1 and H2 antihistamines. That combination should not bother her lungs and may be enough to control the hives.

## 2014-07-28 ENCOUNTER — Ambulatory Visit (INDEPENDENT_AMBULATORY_CARE_PROVIDER_SITE_OTHER): Payer: Medicare Other | Admitting: Emergency Medicine

## 2014-07-28 ENCOUNTER — Telehealth: Payer: Self-pay | Admitting: Pulmonary Disease

## 2014-07-28 ENCOUNTER — Encounter: Payer: Self-pay | Admitting: Emergency Medicine

## 2014-07-28 VITALS — BP 124/68 | HR 84 | Temp 98.4°F | Ht 63.0 in | Wt 112.0 lb

## 2014-07-28 DIAGNOSIS — J209 Acute bronchitis, unspecified: Secondary | ICD-10-CM | POA: Diagnosis not present

## 2014-07-28 MED ORDER — DOXYCYCLINE HYCLATE 100 MG PO TABS
100.0000 mg | ORAL_TABLET | Freq: Two times a day (BID) | ORAL | Status: DC
Start: 2014-07-28 — End: 2014-08-10

## 2014-07-28 MED ORDER — PREDNISONE 10 MG PO TABS: ORAL_TABLET | ORAL | Status: DC

## 2014-07-28 MED ORDER — PREDNISONE 10 MG PO TABS
ORAL_TABLET | ORAL | Status: DC
Start: 2014-07-28 — End: 2014-07-28

## 2014-07-28 NOTE — Assessment & Plan Note (Signed)
No evidence bronchospasm, but consistent with acute bronchitis. Will treat w doxycycline. Will give her a script for pred to fill IF she develops bronchospasm or worsens.

## 2014-07-28 NOTE — Addendum Note (Signed)
Addended by: Len Blalock on: 07/28/2014 04:37 PM   Modules accepted: Orders

## 2014-07-28 NOTE — Progress Notes (Signed)
HPI: 73 yo woman followed for COPD by Dr Gwenette Greet.  She presents today for an acute eval.  She began to have some sore throat and nasal congestion over the last weekend, evolved hoarse voice and cough productive of yellow phlegm. No fevers, CP, no change in SOB.    Past Medical History  Diagnosis Date  . COPD (chronic obstructive pulmonary disease)   . SVT (supraventricular tachycardia)   . IBS (irritable bowel syndrome)   . DJD (degenerative joint disease)   . RLS (restless legs syndrome)   . Hyperlipidemia   . Chronic renal insufficiency   . Endometrial cancer   . Diverticulosis   . Uterine cancer   . Hypertension      Family History  Problem Relation Age of Onset  . Uterine cancer Mother      History   Social History  . Marital Status: Widowed    Spouse Name: N/A    Number of Children: N/A  . Years of Education: N/A   Occupational History  . retired    Social History Main Topics  . Smoking status: Former Smoker -- 1.30 packs/day for 43 years    Types: Cigarettes    Quit date: 12/08/1998  . Smokeless tobacco: Never Used  . Alcohol Use: No  . Drug Use: No  . Sexual Activity: Not on file   Other Topics Concern  . Not on file   Social History Narrative  . No narrative on file     Allergies  Allergen Reactions  . Contrast Media [Iodinated Diagnostic Agents]     LOW KIDNEY FUNCTION///NEED TO CHECK WITH NEPHROLOGIST BEFORE PROCEDURE.  . Biaxin [Clarithromycin] Hives  . Penicillins Hives  . Sulfa Antibiotics Rash     Outpatient Prescriptions Prior to Visit  Medication Sig Dispense Refill  . ALPRAZolam (XANAX) 0.5 MG tablet Take 0.5 mg by mouth at bedtime as needed.      Marland Kitchen AMLODIPINE BESYLATE PO Take 5 mg by mouth daily.       Marland Kitchen atenolol (TENORMIN) 25 MG tablet Take 25 mg by mouth 2 (two) times daily.      . cholecalciferol (VITAMIN D) 1000 UNITS tablet Take 1,000 Units by mouth daily.      . flecainide (TAMBOCOR) 50 MG tablet Take 1 tablet (50 mg total) by  mouth 2 (two) times daily.  60 tablet  5  . Multiple Vitamins-Minerals (CENTRUM SILVER PO) Take 1 tablet by mouth daily.      . NON FORMULARY Oxygen 2lpm at night      . simvastatin (ZOCOR) 20 MG tablet Take 20 mg by mouth every evening.      Marland Kitchen Umeclidinium-Vilanterol (ANORO ELLIPTA) 62.5-25 MCG/INH AEPB Inhale 1 puff into the lungs daily.  180 each  3  . XOPENEX HFA 45 MCG/ACT inhaler INHALE 2 PUFFS BY MOUTH EVERY DAY( WHETHER YOU NEED IT OR NOT) CAN USE 2 ADDITIONAL TIMES 6 HOURS APART AS NEEDED  15 g  6  . tiotropium (SPIRIVA) 18 MCG inhalation capsule Place 18 mcg into inhaler and inhale daily.       No facility-administered medications prior to visit.   Filed Vitals:   07/28/14 1603  BP: 124/68  Pulse: 84  Temp: 98.4 F (36.9 C)  TempSrc: Oral  Height: 5\' 3"  (1.6 m)  Weight: 112 lb (50.803 kg)  SpO2: 95%   Gen: Pleasant, well-nourished, in no distress,  normal affect  ENT: No lesions,  mouth clear,  oropharynx clear, no postnasal drip  Neck: No JVD, no TMG, no carotid bruits  Lungs: No use of accessory muscles, no dullness to percussion, clear without rales or rhonchi  Cardiovascular: RRR, heart sounds normal, no murmur or gallops, no peripheral edema  Neuro: alert, non focal    Acute bronchitis No evidence bronchospasm, but consistent with acute bronchitis. Will treat w doxycycline. Will give her a script for pred to fill IF she develops bronchospasm or worsens.

## 2014-07-28 NOTE — Telephone Encounter (Signed)
Spoke with pt. appt scheduled to see RB this afternoon at 3:45. Nothing further needed

## 2014-07-28 NOTE — Patient Instructions (Signed)
Please continue your Spiriva  Take doxycycline 100mg  twice a day for 7 days You will be given a prescription for prednisone. Start this medication if you develop wheeze or shortness of breath.  Follow with Dr Gwenette Greet or Tammy Parrett in 2 weeks

## 2014-08-02 ENCOUNTER — Other Ambulatory Visit: Payer: Self-pay | Admitting: Cardiology

## 2014-08-10 ENCOUNTER — Ambulatory Visit (INDEPENDENT_AMBULATORY_CARE_PROVIDER_SITE_OTHER): Payer: Medicare Other | Admitting: Adult Health

## 2014-08-10 ENCOUNTER — Encounter: Payer: Self-pay | Admitting: Adult Health

## 2014-08-10 VITALS — BP 110/66 | HR 72 | Temp 98.1°F | Ht 63.0 in | Wt 111.6 lb

## 2014-08-10 DIAGNOSIS — J438 Other emphysema: Secondary | ICD-10-CM

## 2014-08-10 DIAGNOSIS — J432 Centrilobular emphysema: Secondary | ICD-10-CM

## 2014-08-10 DIAGNOSIS — Z23 Encounter for immunization: Secondary | ICD-10-CM

## 2014-08-10 NOTE — Assessment & Plan Note (Signed)
Recent flare now resolved   Plan  Flu shot today  Continue on current regimen  Follow up as planned with Dr. Gwenette Greet

## 2014-08-10 NOTE — Progress Notes (Signed)
  Subjective:    Patient ID: Carol Carson, female    DOB: [birth year], 73 y.o.   MRN: [id number]  HPI  73 yo female with known hx of COPD   08/10/2014 Follow up  Returns for a 2 week follow up for COPD flare  Tx with doxycycline and steroid taper .  She is feeling much better . Breathing has returned to her baseline.  Denies any chest pain,orthopnea, edema or fever.     Review of Systems   Constitutional:   No  weight loss, night sweats,  Fevers, chills, fatigue, or  lassitude.  HEENT:   No headaches,  Difficulty swallowing,  Tooth/dental problems, or  Sore throat,                No sneezing, itching, ear ache, nasal congestion, post nasal drip,   CV:  No chest pain,  Orthopnea, PND, swelling in lower extremities, anasarca, dizziness, palpitations, syncope.   GI  No heartburn, indigestion, abdominal pain, nausea, vomiting, diarrhea, change in bowel habits, loss of appetite, bloody stools.   Resp:   No coughing up of blood.  No change in color of mucus.  No wheezing.  No chest wall deformity  Skin: no rash or lesions.  GU: no dysuria, change in color of urine, no urgency or frequency.  No flank pain, no hematuria   MS:  No joint pain or swelling.  No decreased range of motion.  No back pain.  Psych:  No change in mood or affect. No depression or anxiety.  No memory loss.           Objective:   Physical Exam  GEN: A/Ox3; pleasant , NAD,elderly thin  HEENT:  [place]/AT,  EACs-clear, TMs-wnl, NOSE-clear, THROAT-clear, no lesions, no postnasal drip or exudate noted.   NECK:  Supple w/ fair ROM; no JVD; normal carotid impulses w/o bruits; no thyromegaly or nodules palpated; no lymphadenopathy.  RESP  Diminished BS in bases no accessory muscle use, no dullness to percussion  CARD:  RRR, no m/r/g  , no peripheral edema, pulses intact, no cyanosis or clubbing.  GI:   Soft & nt; nml bowel sounds; no organomegaly or masses detected.  Musco: Warm bil, no deformities or joint  swelling noted.   Neuro: alert, no focal deficits noted.    Skin: Warm, no lesions or rashes         Assessment & Plan:

## 2014-08-10 NOTE — Patient Instructions (Signed)
Flu shot today  Continue on current regimen  Follow up as planned with Dr. Gwenette Greet

## 2014-08-11 NOTE — Progress Notes (Signed)
Ov reviewed and agree with plan as outlined.  

## 2014-08-17 ENCOUNTER — Encounter: Payer: Self-pay | Admitting: Cardiology

## 2014-08-17 ENCOUNTER — Ambulatory Visit (INDEPENDENT_AMBULATORY_CARE_PROVIDER_SITE_OTHER): Payer: Medicare Other | Admitting: Cardiology

## 2014-08-17 VITALS — BP 110/68 | HR 88 | Ht 63.0 in | Wt 111.6 lb

## 2014-08-17 DIAGNOSIS — I498 Other specified cardiac arrhythmias: Secondary | ICD-10-CM | POA: Diagnosis not present

## 2014-08-17 DIAGNOSIS — R42 Dizziness and giddiness: Secondary | ICD-10-CM | POA: Diagnosis not present

## 2014-08-17 DIAGNOSIS — I10 Essential (primary) hypertension: Secondary | ICD-10-CM | POA: Diagnosis not present

## 2014-08-17 DIAGNOSIS — I471 Supraventricular tachycardia: Secondary | ICD-10-CM

## 2014-08-17 NOTE — Patient Instructions (Signed)
Your physician recommends that you continue on your current medications as directed. Please refer to the Current Medication list given to you today.  The next time your get a dizzy spell please check your Blood Pressure and call us with the results.   Your physician wants you to follow-up in: 6 months with Dr Mallie Snooks will receive a reminder letter in the mail two months in advance. If you don't receive a letter, please call our office to schedule the follow-up appointment.

## 2014-08-17 NOTE — Progress Notes (Signed)
[place], [place], [place]  [address] Phone: [phone] Fax:  [phone]  Date:  08/17/2014   ID:  Carol Carson, DOB 09/19/1941, MRN [id number]  PCP:  Cloyd Stagers, MD  Cardiologist:  Fransico Him, MD     History of Present Illness: Carol Carson is a 73 y.o. female with a history of SVT and HTN who presents today for followup. She is doing well. She denies any chest pain, LE edema or syncope. She has chronic SOB due to COPD which is stable. She occasionally will have some lightheadedness when up doing things.  Occasionally she will have some skipped heart beats.  She recently had a COPD flare with bronchitis.    Wt Readings from Last 3 Encounters:  08/17/14 111 lb 9.6 oz (50.621 kg)  08/10/14 111 lb 9.6 oz (50.621 kg)  07/28/14 112 lb (50.803 kg)     Past Medical History  Diagnosis Date  . COPD (chronic obstructive pulmonary disease)   . SVT (supraventricular tachycardia)   . IBS (irritable bowel syndrome)   . DJD (degenerative joint disease)   . RLS (restless legs syndrome)   . Hyperlipidemia   . Chronic renal insufficiency   . Endometrial cancer   . Diverticulosis   . Uterine cancer   . Hypertension     Current Outpatient Prescriptions  Medication Sig Dispense Refill  . ALPRAZolam (XANAX) 0.5 MG tablet Take 0.5 mg by mouth at bedtime as needed.      Marland Kitchen AMLODIPINE BESYLATE PO Take 5 mg by mouth daily.       Marland Kitchen atenolol (TENORMIN) 25 MG tablet Take 25 mg by mouth 2 (two) times daily.      . cholecalciferol (VITAMIN D) 1000 UNITS tablet Take 1,000 Units by mouth daily.      . flecainide (TAMBOCOR) 50 MG tablet TAKE 1 TABLET BY MOUTH TWICE DAILY  180 tablet  0  . hydrOXYzine (ATARAX/VISTARIL) 10 MG tablet Take 1 tablet by mouth every 6 (six) hours as needed.      . levalbuterol (XOPENEX HFA) 45 MCG/ACT inhaler 2 puffs bid      . Multiple Vitamins-Minerals (CENTRUM SILVER PO) Take 1 tablet by mouth daily.      . NON FORMULARY Oxygen  2lpm at night      . simvastatin (ZOCOR) 20 MG tablet Take 20 mg by mouth every evening.      Marland Kitchen Umeclidinium-Vilanterol (ANORO ELLIPTA) 62.5-25 MCG/INH AEPB Inhale 1 puff into the lungs daily.  180 each  3   No current facility-administered medications for this visit.    Allergies:    Allergies  Allergen Reactions  . Contrast Media [Iodinated Diagnostic Agents]     LOW KIDNEY FUNCTION///NEED TO CHECK WITH NEPHROLOGIST BEFORE PROCEDURE.  . Biaxin [Clarithromycin] Hives  . Penicillins Hives  . Sulfa Antibiotics Rash    Social History:  The patient  reports that she quit smoking about 15 years ago. Her smoking use included Cigarettes. She has a 55.9 pack-year smoking history. She has never used smokeless tobacco. She reports that she does not drink alcohol or use illicit drugs.   Family History:  The patient's family history includes Uterine cancer in her mother.   ROS:  Please see the history of present illness.      All other systems reviewed and negative.   PHYSICAL EXAM: VS:  BP 110/68  Pulse 88  Ht 5\' 3"  (1.6 m)  Wt 111 lb 9.6 oz (50.621  kg)  BMI 19.77 kg/m2 Well nourished, well developed, in no acute distress HEENT: normal Neck: no JVD Cardiac:  normal S1, S2; RRR; no murmur Lungs:  Few crackles anteriorly Abd: soft, nontender, no hepatomegaly Ext: no edema Skin: warm and dry Neuro:  CNs 2-12 intact, no focal abnormalities noted  ASSESSMENT AND PLAN:  1. SVT with no reoccurrence  - continue Flecainide and Atenolol  2. HTN - well controlled - continue Amlodipine/Atenolol        3.  Dizziness - intermittent if she has been up standing.  I have asked her to check her BP the next time she gets dizzy and call with results  Followup with me in 6 months      Signed, Fransico Him, MD 08/17/2014 10:48 AM

## 2014-08-21 DIAGNOSIS — F410 Panic disorder [episodic paroxysmal anxiety] without agoraphobia: Secondary | ICD-10-CM | POA: Diagnosis not present

## 2014-08-21 DIAGNOSIS — N183 Chronic kidney disease, stage 3 unspecified: Secondary | ICD-10-CM | POA: Diagnosis not present

## 2014-08-21 DIAGNOSIS — J449 Chronic obstructive pulmonary disease, unspecified: Secondary | ICD-10-CM | POA: Diagnosis not present

## 2014-08-21 DIAGNOSIS — M899 Disorder of bone, unspecified: Secondary | ICD-10-CM | POA: Diagnosis not present

## 2014-08-21 DIAGNOSIS — I471 Supraventricular tachycardia: Secondary | ICD-10-CM | POA: Diagnosis not present

## 2014-08-21 DIAGNOSIS — M949 Disorder of cartilage, unspecified: Secondary | ICD-10-CM | POA: Diagnosis not present

## 2014-08-21 DIAGNOSIS — I10 Essential (primary) hypertension: Secondary | ICD-10-CM | POA: Diagnosis not present

## 2014-08-21 DIAGNOSIS — Z1331 Encounter for screening for depression: Secondary | ICD-10-CM | POA: Diagnosis not present

## 2014-08-21 DIAGNOSIS — Z0000 Encounter for general adult medical examination without abnormal findings: Secondary | ICD-10-CM | POA: Diagnosis not present

## 2014-09-06 DIAGNOSIS — H18519 Endothelial corneal dystrophy, unspecified eye: Secondary | ICD-10-CM | POA: Diagnosis not present

## 2014-09-06 DIAGNOSIS — H18599 Other hereditary corneal dystrophies, unspecified eye: Secondary | ICD-10-CM | POA: Diagnosis not present

## 2014-09-06 DIAGNOSIS — H5710 Ocular pain, unspecified eye: Secondary | ICD-10-CM | POA: Diagnosis not present

## 2014-09-13 DIAGNOSIS — H5711 Ocular pain, right eye: Secondary | ICD-10-CM | POA: Diagnosis not present

## 2014-09-22 ENCOUNTER — Other Ambulatory Visit: Payer: Self-pay

## 2014-10-27 ENCOUNTER — Ambulatory Visit: Payer: Medicare Other | Admitting: Pulmonary Disease

## 2014-10-31 ENCOUNTER — Other Ambulatory Visit: Payer: Self-pay | Admitting: Cardiology

## 2014-11-01 DIAGNOSIS — D485 Neoplasm of uncertain behavior of skin: Secondary | ICD-10-CM | POA: Diagnosis not present

## 2014-11-01 DIAGNOSIS — L500 Allergic urticaria: Secondary | ICD-10-CM | POA: Diagnosis not present

## 2014-11-07 ENCOUNTER — Ambulatory Visit (INDEPENDENT_AMBULATORY_CARE_PROVIDER_SITE_OTHER): Payer: Medicare Other | Admitting: Pulmonary Disease

## 2014-11-07 ENCOUNTER — Encounter: Payer: Self-pay | Admitting: Pulmonary Disease

## 2014-11-07 VITALS — BP 124/76 | HR 85 | Temp 97.7°F | Ht 63.5 in | Wt 120.4 lb

## 2014-11-07 DIAGNOSIS — J438 Other emphysema: Secondary | ICD-10-CM

## 2014-11-07 NOTE — Patient Instructions (Signed)
Stay on anoro one inhalation each am.  Can use your xopenex inhaler as needed for rescue.  Stay as active as possible. followup with me again in one year, but call if you are having breathing issues.

## 2014-11-07 NOTE — Progress Notes (Signed)
   Subjective:    Patient ID: Carol Carson, female    DOB: [birth year], 73 y.o.   MRN: [id number]  HPI The patient comes in today for follow-up of her known COPD. She normally does extremely well, but had her first COPD exacerbation recently that was associated with acute bronchitis. She has been treated with antibiotics and prednisone, and feels that she is back to her usual baseline. She is maintaining on anoro, and rarely requires her rescue inhaler.   Review of Systems  Constitutional: Negative for fever and unexpected weight change.  HENT: Positive for postnasal drip. Negative for congestion, dental problem, ear pain, nosebleeds, rhinorrhea, sinus pressure, sneezing, sore throat and trouble swallowing.   Eyes: Negative for redness and itching.  Respiratory: Negative for cough, chest tightness, shortness of breath and wheezing.   Cardiovascular: Negative for palpitations and leg swelling.  Gastrointestinal: Negative for nausea and vomiting.  Genitourinary: Negative for dysuria.  Musculoskeletal: Negative for joint swelling.  Skin: Negative for rash.  Neurological: Negative for headaches.  Hematological: Does not bruise/bleed easily.  Psychiatric/Behavioral: Negative for dysphoric mood. The patient is not nervous/anxious.        Objective:   Physical Exam Well-developed female in no acute distress Nose without purulence or discharge noted Neck without lymphadenopathy or thyromegaly Chest with decreased breath sounds, inspiratory squeaks and right upper lung zone, no true wheezing Cardiac exam with regular rate and rhythm Lower extremities without edema, no cyanosis Alert and oriented, moves all 4 extremities.       Assessment & Plan:

## 2014-11-07 NOTE — Assessment & Plan Note (Signed)
The patient is back to her usual baseline after a recent acute exacerbation associated with acute bronchitis. She feels that she has been doing well on anoro alone, but she understands that if she starts having more frequent exacerbations, we will need to add inhaled corticosteroids.  I have also stressed to her the importance of staying as active as possible in working on conditioning.

## 2014-11-09 DIAGNOSIS — R809 Proteinuria, unspecified: Secondary | ICD-10-CM | POA: Diagnosis not present

## 2014-11-09 DIAGNOSIS — N2581 Secondary hyperparathyroidism of renal origin: Secondary | ICD-10-CM | POA: Diagnosis not present

## 2014-11-09 DIAGNOSIS — D631 Anemia in chronic kidney disease: Secondary | ICD-10-CM | POA: Diagnosis not present

## 2014-11-09 DIAGNOSIS — N189 Chronic kidney disease, unspecified: Secondary | ICD-10-CM | POA: Diagnosis not present

## 2014-11-09 DIAGNOSIS — N183 Chronic kidney disease, stage 3 (moderate): Secondary | ICD-10-CM | POA: Diagnosis not present

## 2014-12-06 DIAGNOSIS — R51 Headache: Secondary | ICD-10-CM | POA: Diagnosis not present

## 2015-01-16 DIAGNOSIS — L820 Inflamed seborrheic keratosis: Secondary | ICD-10-CM | POA: Diagnosis not present

## 2015-02-16 ENCOUNTER — Encounter: Payer: Self-pay | Admitting: Pulmonary Disease

## 2015-02-21 NOTE — Progress Notes (Signed)
Cardiology Office Note   Date:  02/22/2015   ID:  Carol Carson, DOB 18-Jan-1941, MRN [id number]  PCP:  Ileana Roup, MD  Cardiologist:   Sueanne Margarita, MD   Chief Complaint  Patient presents with  . svt  . Hypertension      History of Present Illness: Carol Carson is a 74 y.o. female with a history of SVT and HTN who presents today for followup. She is doing well. She denies any chest pain, LE edema or syncope. She has chronic SOB due to COPD which is stable. Occasionally she will have some skipped heart beats.  She occasionally has some problems with dizziness that she describes as feeling off balance and cannot walk in a straight line.  She does not feel like she is going to pass out.       Past Medical History  Diagnosis Date  . COPD (chronic obstructive pulmonary disease)   . SVT (supraventricular tachycardia)   . IBS (irritable bowel syndrome)   . DJD (degenerative joint disease)   . RLS (restless legs syndrome)   . Hyperlipidemia   . Chronic renal insufficiency   . Endometrial cancer   . Diverticulosis   . Uterine cancer   . Hypertension     Past Surgical History  Procedure Laterality Date  . Lung surgery  2002    golf ball sized tumor, left lung---spot vanished   . Abdominal hysterectomy      d/t unterine cancer///no chemo/no radiation     Current Outpatient Prescriptions  Medication Sig Dispense Refill  . ALPRAZolam (XANAX) 0.25 MG tablet Take 0.25 mg by mouth as needed.   5  . amLODipine (NORVASC) 5 MG tablet Take 5 mg by mouth daily.   3  . atenolol (TENORMIN) 25 MG tablet Take 25 mg by mouth 2 (two) times daily.    . cholecalciferol (VITAMIN D) 1000 UNITS tablet Take 1,000 Units by mouth daily.    . flecainide (TAMBOCOR) 50 MG tablet TAKE 1 TABLET BY MOUTH TWICE DAILY. 180 tablet 1  . levalbuterol (XOPENEX HFA) 45 MCG/ACT inhaler 2 puffs bid as needed    . Multiple Vitamins-Minerals (CENTRUM SILVER PO) Take 1 tablet by mouth daily.     . NON FORMULARY Oxygen 2lpm at night    . simvastatin (ZOCOR) 20 MG tablet Take 20 mg by mouth every evening.     No current facility-administered medications for this visit.    Allergies:   Contrast media; Biaxin; Penicillins; and Sulfa antibiotics    Social History:  The patient  reports that she quit smoking about 16 years ago. Her smoking use included Cigarettes. She has a 55.9 pack-year smoking history. She has never used smokeless tobacco. She reports that she does not drink alcohol or use illicit drugs.   Family History:  The patient's family history includes Uterine cancer in her mother.    ROS:  Please see the history of present illness.   Otherwise, review of systems are positive for none.   All other systems are reviewed and negative.    PHYSICAL EXAM: VS:  BP 132/60 mmHg  Pulse 84  Ht 5\' 3"  (1.6 m)  Wt 120 lb 12.8 oz (54.795 kg)  BMI 21.40 kg/m2 , BMI Body mass index is 21.4 kg/(m^2). GEN: Well nourished, well developed, in no acute distress HEENT: normal Neck: no JVD, carotid bruits, or masses Cardiac: RRR; no murmurs, rubs, or gallops,no edema  Respiratory:  Few scattered dry crackles  from COPD GI: soft, nontender, nondistended, + BS MS: no deformity or atrophy Skin: warm and dry, no rash Neuro:  Strength and sensation are intact Psych: euthymic mood, full affect   EKG:  EKG was ordered today and showed NSR with first degree degree AV block and septal infarct    Recent Labs: No results found for requested labs within last 365 days.    Lipid Panel No results found for: CHOL, TRIG, HDL, CHOLHDL, VLDL, LDLCALC, LDLDIRECT    Wt Readings from Last 3 Encounters:  02/22/15 120 lb 12.8 oz (54.795 kg)  11/07/14 120 lb 6.4 oz (54.613 kg)  08/17/14 111 lb 9.6 oz (50.621 kg)    ASSESSMENT AND PLAN:  1. SVT with no reoccurrence - continue Flecainide and Atenolol  2. HTN - well controlled - continue Amlodipine/Atenolol      Current medicines are  reviewed at length with the patient today.  The patient does not have concerns regarding medicines.  The following changes have been made:  no change  Labs/ tests ordered today include: None   Orders Placed This Encounter  Procedures  . EKG 12-Lead     Disposition:   FU with me in 6 months   Signed, Sueanne Margarita, MD  02/22/2015 12:00 PM    [address], [place], [place]  [address] Phone: [phone]; Fax: [phone]

## 2015-02-22 ENCOUNTER — Ambulatory Visit (INDEPENDENT_AMBULATORY_CARE_PROVIDER_SITE_OTHER): Payer: Medicare Other | Admitting: Cardiology

## 2015-02-22 ENCOUNTER — Encounter: Payer: Self-pay | Admitting: Cardiology

## 2015-02-22 VITALS — BP 132/60 | HR 84 | Ht 63.0 in | Wt 120.8 lb

## 2015-02-22 DIAGNOSIS — I471 Supraventricular tachycardia: Secondary | ICD-10-CM

## 2015-02-22 DIAGNOSIS — I10 Essential (primary) hypertension: Secondary | ICD-10-CM

## 2015-02-22 NOTE — Patient Instructions (Signed)
Your physician recommends that you continue on your current medications as directed. Please refer to the Current Medication list given to you today.  Your physician wants you to follow-up in: 6 months with Dr Turner You will receive a reminder letter in the mail two months in advance. If you don't receive a letter, please call our office to schedule the follow-up appointment.  

## 2015-02-27 DIAGNOSIS — H6000 Abscess of external ear, unspecified ear: Secondary | ICD-10-CM | POA: Diagnosis not present

## 2015-02-27 DIAGNOSIS — R700 Elevated erythrocyte sedimentation rate: Secondary | ICD-10-CM | POA: Diagnosis not present

## 2015-02-27 DIAGNOSIS — R51 Headache: Secondary | ICD-10-CM | POA: Diagnosis not present

## 2015-02-27 DIAGNOSIS — H5711 Ocular pain, right eye: Secondary | ICD-10-CM | POA: Diagnosis not present

## 2015-02-27 DIAGNOSIS — R55 Syncope and collapse: Secondary | ICD-10-CM | POA: Diagnosis not present

## 2015-03-08 ENCOUNTER — Telehealth: Payer: Self-pay | Admitting: Pulmonary Disease

## 2015-03-08 MED ORDER — UMECLIDINIUM-VILANTEROL 62.5-25 MCG/INH IN AEPB: 1.0000 | INHALATION_SPRAY | Freq: Every day | RESPIRATORY_TRACT | Status: DC

## 2015-03-08 NOTE — Telephone Encounter (Signed)
Pt requesting refill of 90 day supply Anoro to [facility].  This has been sent.  Nothing further needed.

## 2015-03-09 ENCOUNTER — Other Ambulatory Visit: Payer: Self-pay

## 2015-03-09 DIAGNOSIS — Z1231 Encounter for screening mammogram for malignant neoplasm of breast: Secondary | ICD-10-CM

## 2015-03-27 ENCOUNTER — Ambulatory Visit
Admission: RE | Admit: 2015-03-27 | Discharge: 2015-03-27 | Disposition: A | Discharge status: Home / Self Care | Payer: Medicare Other | Source: Ambulatory Visit

## 2015-03-27 DIAGNOSIS — Z1231 Encounter for screening mammogram for malignant neoplasm of breast: Secondary | ICD-10-CM | POA: Diagnosis not present

## 2015-05-16 ENCOUNTER — Other Ambulatory Visit: Payer: Self-pay | Admitting: Cardiology

## 2015-05-22 DIAGNOSIS — K625 Hemorrhage of anus and rectum: Secondary | ICD-10-CM | POA: Diagnosis not present

## 2015-05-22 DIAGNOSIS — Z1211 Encounter for screening for malignant neoplasm of colon: Secondary | ICD-10-CM | POA: Diagnosis not present

## 2015-05-22 DIAGNOSIS — K5730 Diverticulosis of large intestine without perforation or abscess without bleeding: Secondary | ICD-10-CM | POA: Diagnosis not present

## 2015-06-04 ENCOUNTER — Other Ambulatory Visit: Payer: Self-pay

## 2015-06-06 DIAGNOSIS — R102 Pelvic and perineal pain: Secondary | ICD-10-CM | POA: Diagnosis not present

## 2015-06-06 DIAGNOSIS — N952 Postmenopausal atrophic vaginitis: Secondary | ICD-10-CM | POA: Diagnosis not present

## 2015-06-06 DIAGNOSIS — R198 Other specified symptoms and signs involving the digestive system and abdomen: Secondary | ICD-10-CM | POA: Diagnosis not present

## 2015-06-13 ENCOUNTER — Encounter: Payer: Self-pay | Admitting: Cardiology

## 2015-06-18 DIAGNOSIS — K635 Polyp of colon: Secondary | ICD-10-CM | POA: Diagnosis not present

## 2015-06-18 DIAGNOSIS — K625 Hemorrhage of anus and rectum: Secondary | ICD-10-CM | POA: Diagnosis not present

## 2015-06-18 DIAGNOSIS — K5730 Diverticulosis of large intestine without perforation or abscess without bleeding: Secondary | ICD-10-CM | POA: Diagnosis not present

## 2015-06-18 DIAGNOSIS — Z1211 Encounter for screening for malignant neoplasm of colon: Secondary | ICD-10-CM | POA: Diagnosis not present

## 2015-06-18 DIAGNOSIS — K621 Rectal polyp: Secondary | ICD-10-CM | POA: Diagnosis not present

## 2015-06-18 DIAGNOSIS — D120 Benign neoplasm of cecum: Secondary | ICD-10-CM | POA: Diagnosis not present

## 2015-06-20 ENCOUNTER — Telehealth: Payer: Self-pay | Admitting: Pulmonary Disease

## 2015-06-20 MED ORDER — LEVALBUTEROL TARTRATE 45 MCG/ACT IN AERO
INHALATION_SPRAY | RESPIRATORY_TRACT | Status: DC
Start: 2015-06-20 — End: 2016-05-15

## 2015-06-20 NOTE — Telephone Encounter (Signed)
Patient calling back needing refill on Xopenex. She missed call but can be reached now at [phone].  Pharmacy is [facility].

## 2015-06-20 NOTE — Telephone Encounter (Signed)
Spoke with the pt  She states that her friend is a [facility] pt and she requests to see him in Dec 2016 since this is when she is due for f/u per KC's last note  I have refilled her xopenex and advised to call if needed  Recall placed for Dec appt  Nothing further needed per pt

## 2015-06-20 NOTE — Telephone Encounter (Signed)
lmtcb

## 2015-06-28 ENCOUNTER — Ambulatory Visit (INDEPENDENT_AMBULATORY_CARE_PROVIDER_SITE_OTHER)
Admission: RE | Admit: 2015-06-28 | Discharge: 2015-06-28 | Disposition: A | Discharge status: Home / Self Care | Payer: Medicare Other | Source: Ambulatory Visit | Attending: Internal Medicine | Admitting: Internal Medicine

## 2015-06-28 ENCOUNTER — Encounter: Payer: Self-pay | Admitting: Internal Medicine

## 2015-06-28 ENCOUNTER — Ambulatory Visit (INDEPENDENT_AMBULATORY_CARE_PROVIDER_SITE_OTHER): Payer: Medicare Other | Admitting: Internal Medicine

## 2015-06-28 VITALS — BP 128/68 | HR 88 | Ht 63.0 in | Wt 115.0 lb

## 2015-06-28 DIAGNOSIS — G4734 Idiopathic sleep related nonobstructive alveolar hypoventilation: Secondary | ICD-10-CM

## 2015-06-28 DIAGNOSIS — R0602 Shortness of breath: Secondary | ICD-10-CM | POA: Diagnosis not present

## 2015-06-28 DIAGNOSIS — J441 Chronic obstructive pulmonary disease with (acute) exacerbation: Secondary | ICD-10-CM | POA: Diagnosis not present

## 2015-06-28 DIAGNOSIS — R05 Cough: Secondary | ICD-10-CM | POA: Diagnosis not present

## 2015-06-28 MED ORDER — PREDNISONE 10 MG PO TABS: ORAL_TABLET | ORAL | Status: DC

## 2015-06-28 MED ORDER — LEVALBUTEROL TARTRATE 45 MCG/ACT IN AERO: INHALATION_SPRAY | RESPIRATORY_TRACT | Status: DC

## 2015-06-28 MED ORDER — DOXYCYCLINE HYCLATE 100 MG PO TABS: 100.0000 mg | ORAL_TABLET | Freq: Two times a day (BID) | ORAL | Status: DC

## 2015-06-28 NOTE — Progress Notes (Signed)
Quick Note:  Spoke with pt and notified of results per Dr. Wert. Pt verbalized understanding and denied any questions.  ______ 

## 2015-06-28 NOTE — Progress Notes (Signed)
Subjective:    Patient ID: Carol Carson, female    DOB: 07/08/1941   MRN: [id number]    Brief patient profile:  40 yowf quit smoking 2000  with GOLD III  COPD documented 2014    History of Present Illness  08/10/2014 NP  Follow up  Returns for a 2 week follow up for COPD flare  Tx with doxycycline and steroid taper .  She is feeling much better . Breathing has returned to her baseline.  rec No change rx anoro      06/28/2015 f/u ov/Keyontae Huckeby re:  Copd/ anoro maint rx/ xopenex rescue  Chief Complaint  Patient presents with  . Acute Visit    started w/ a tickle in the throat, now a cough w/ a yellow mucus, gets tried easily w/ a few steps begins puffing  acutely worse since 7/17 and having to use xopenex sev times a day where at baseline not needing at all but does mostly relieve here sense of sob and chest tightness but not the tickle or coughing fits  No obvious day to day or daytime variability or assoc  cp   or overt sinus or hb symptoms. No unusual exp hx or h/o childhood pna/ asthma or knowledge of premature birth.  Sleeping ok without nocturnal  or early am exacerbation  of respiratory  c/o's or need for noct saba. Also denies any obvious fluctuation of symptoms with weather or environmental changes or other aggravating or alleviating factors except as outlined above   Current Medications, Allergies, Complete Past Medical History, Past Surgical History, Family History, and Social History were reviewed in [employer] [facility] record.  ROS  The following are not active complaints unless bolded sore throat, dysphagia, dental problems, itching, sneezing,  nasal congestion or excess/ purulent secretions, ear ache,   fever, chills, sweats, unintended wt loss, classically pleuritic or exertional cp, hemoptysis,  orthopnea pnd or leg swelling, presyncope, palpitations, abdominal pain, anorexia, nausea, vomiting, diarrhea  or change in bowel or bladder habits, change in stools  or urine, dysuria,hematuria,  rash, arthralgias, visual complaints, headache, numbness, weakness or ataxia or problems with walking or coordination,  change in mood/affect or memory.                       Objective:   Physical Exam  amb slt anxious wf nad   Wt Readings from Last 3 Encounters:  06/28/15 115 lb (52.164 kg)  02/22/15 120 lb 12.8 oz (54.795 kg)  11/07/14 120 lb 6.4 oz (54.613 kg)    Vital signs reviewed    HEENT: nl dentition, turbinates, and orophanx. Nl external ear canals without cough reflex   NECK :  without JVD/Nodes/TM/ nl carotid upstrokes bilaterally   LUNGS: no acc muscle use, distant bs bilaterally with min exp wheeze no increased wob    CV:  RRR  no s3 or murmur or increase in P2, no edema   ABD:  soft and nontender with nl excursion in the supine position. No bruits or organomegaly, bowel sounds nl  MS:  warm without deformities, calf tenderness, cyanosis or clubbing  SKIN: warm and dry without lesions    NEURO:  alert, approp, no deficits    I personally reviewed images and agree with radiology impression as follows:  CXR:  06/28/15 Stable cardiac and mediastinal contours. Unchanged nodular density within the left mid lung. Biapical pleural parenchymal thickening. No consolidative pulmonary opacities. No pleural effusion or pneumothorax. Regional skeleton  is unremarkable. No acute cardiopulmonary process.             Assessment & Plan:

## 2015-06-28 NOTE — Patient Instructions (Addendum)
Please remember to go to the x-ray department downstairs for your tests - we will call you with the results when they are available.  Continue anoro each am   Only use your xopenex as a rescue medication to be used if you can't catch your breath by resting or doing a relaxed purse lip breathing pattern.  - The less you use it, the better it will work when you need it. - Ok to use up to 2 puffs  every 4 hours if you must but call for immediate appointment if use goes up over your usual need - Don't leave home without it !!  (think of it like the spare tire for your car)   Doxycycline x 7 days  Prednisone 10 mg take  4 each am x 2 days,   2 each am x 2 days,  1 each am x 2 days and stop   Please schedule a follow up office visit in 6 weeks, call sooner if needed   late add check walking sats

## 2015-07-01 ENCOUNTER — Encounter: Payer: Self-pay | Admitting: Internal Medicine

## 2015-07-01 NOTE — Assessment & Plan Note (Addendum)
PFT's 01/2013:  FEV1 0.77 (35%), ratio 55, ++airtrapping, no restriction, DLCO 32%  So she has severe copd GOLD III at baseline with acute flare with AB component but still comfortable at rest after xopenex so will try repeat rx as for past flare since it was almost a year ago and worked then:  Doxy/ pred  The proper method of use, as well as anticipated side effects, of a metered-dose inhaler are discussed and demonstrated to the patient. Improved effectiveness after extensive coaching during this visit to a level of approximately  75% so rec continue anoro and prn saba per hfa though if cough becomes more of a chronic / recurrent issue I would rec change to laba/lama hfa   I had an extended discussion with the patient reviewing all relevant studies completed to date and  Lasting 25 minutes of a 40 minute visit    Each maintenance medication was reviewed in detail including most importantly the difference between maintenance and prns and under what circumstances the prns are to be triggered using an action plan format that is not reflected in the computer generated alphabetically organized AVS.    Please see instructions for details which were reviewed in writing and the patient given a copy highlighting the part that I personally wrote and discussed at today's ov.

## 2015-07-01 NOTE — Assessment & Plan Note (Addendum)
ONO RA 01/2013:  Low sat 73%, 5 hrs spent less than or equal to 88%.  Note despite flare doing fine on RA   rec 2lpm hs only for now but after flare and hopefully at baseline in 6 weeks rec walking sats as dlco is quite low and she may be chronically desaturating

## 2015-08-09 ENCOUNTER — Encounter: Payer: Self-pay | Admitting: Internal Medicine

## 2015-08-09 ENCOUNTER — Ambulatory Visit (INDEPENDENT_AMBULATORY_CARE_PROVIDER_SITE_OTHER): Payer: Medicare Other | Admitting: Internal Medicine

## 2015-08-09 VITALS — BP 108/62 | HR 78 | Ht 63.0 in | Wt 114.0 lb

## 2015-08-09 DIAGNOSIS — J449 Chronic obstructive pulmonary disease, unspecified: Secondary | ICD-10-CM | POA: Diagnosis not present

## 2015-08-09 NOTE — Assessment & Plan Note (Signed)
-   PFT's 01/2013:  FEV1 0.77 (35%), ratio 55, ++airtrapping, no restriction, DLCO 32% - 08/09/2015  Walked RA  2 laps @ 185 ft each stopped due to desat to 86%  but no sob/ slow pace   She has baseline moderately severe copd and not aware when she's desaturating   I had an extended discussion with the patient reviewing all relevant studies completed to date and  lasting 15 to 20 minutes of a 25 minute visit    1) options are walk slower, wear 02 with walking outside the house or with ex indoors, or risk cardiac side effects > she wants to try the first and declined rehab at this point  2) advised to monitor sats with ex and maintain over 90%   Each maintenance medication was reviewed in detail including most importantly the difference between maintenance and prns and under what circumstances the prns are to be triggered using an action plan format that is not reflected in the computer generated alphabetically organized AVS.    Please see instructions for details which were reviewed in writing and the patient given a copy highlighting the part that I personally wrote and discussed at today's ov.

## 2015-08-09 NOTE — Progress Notes (Signed)
Subjective:    Patient ID: Carol Carson, female    DOB: 1941/12/07   MRN: [id number]    Brief patient profile:  56 yowf quit smoking 2000  with GOLD III  COPD documented 2014    History of Present Illness  08/10/2014 NP  Follow up  Returns for a 2 week follow up for COPD flare  Tx with doxycycline and steroid taper .  She is feeling much better . Breathing has returned to her baseline.  rec No change rx anoro      06/28/2015 f/u ov/Daissy Yerian re:  Copd/ anoro maint rx/ xopenex rescue  Chief Complaint  Patient presents with  . Acute Visit    started w/ a tickle in the throat, now a cough w/ a yellow mucus, gets tried easily w/ a few steps begins puffing  acutely worse since 7/17 and having to use xopenex sev times a day where at baseline not needing at all but does mostly relieve here sense of sob and chest tightness but not the tickle or coughing fits rec Continue anoro each am  Only use your xopenex as a rescue medication  Doxycycline x 7 days  Prednisone 10 mg take  4 each am x 2 days,   2 each am x 2 days,  1 each am x 2 days and stop  late add check walking sats    08/09/2015 f/u ov/Delshon Blanchfield re: copd GOLD III Chief Complaint  Patient presents with  . Follow-up    Pt states breathing is back to baseline. She states she has no new complaints at this time.   overall feels losing ground x one year and admits she's getting more and more sedendary/ has never done  Rehab  Doe MMRC 1 to 2 level Rarely using xopenex/ maint on anoro daily   No obvious day to day or daytime variability or assoc  cp   or overt sinus or hb symptoms. No unusual exp hx or h/o childhood pna/ asthma or knowledge of premature birth.  Sleeping ok without nocturnal  or early am exacerbation  of respiratory  c/o's or need for noct saba. Also denies any obvious fluctuation of symptoms with weather or environmental changes or other aggravating or alleviating factors except as outlined above   Current Medications,  Allergies, Complete Past Medical History, Past Surgical History, Family History, and Social History were reviewed in [employer] record.  ROS  The following are not active complaints unless bolded sore throat, dysphagia, dental problems, itching, sneezing,  nasal congestion or excess/ purulent secretions, ear ache,   fever, chills, sweats, unintended wt loss, classically pleuritic or exertional cp, hemoptysis,  orthopnea pnd or leg swelling, presyncope, palpitations, abdominal pain, anorexia, nausea, vomiting, diarrhea  or change in bowel or bladder habits, change in stools or urine, dysuria,hematuria,  rash, arthralgias, visual complaints, headache, numbness, weakness or ataxia or problems with walking or coordination,  change in mood/affect or memory.                Objective:   Physical Exam  Thin amb slt anxious wf nad   08/09/2015          114  Wt Readings from Last 3 Encounters:  06/28/15 115 lb (52.164 kg)  02/22/15 120 lb 12.8 oz (54.795 kg)  11/07/14 120 lb 6.4 oz (54.613 kg)    Vital signs reviewed    HEENT: nl dentition, turbinates, and orophanx. Nl external ear canals without cough reflex   NECK :  without JVD/Nodes/TM/ nl carotid upstrokes bilaterally   LUNGS: no acc muscle use, distant bs bilaterally with min exp wheeze    CV:  RRR  no s3 or murmur or increase in P2, no edema   ABD:  soft and nontender with nl excursion in the supine position. No bruits or organomegaly, bowel sounds nl  MS:  warm without deformities, calf tenderness, cyanosis or clubbing  SKIN: warm and dry without lesions    NEURO:  alert, approp, no deficits    I personally reviewed images and agree with radiology impression as follows:  CXR:  06/28/15 Stable cardiac and mediastinal contours. Unchanged nodular density within the left mid lung. Biapical pleural parenchymal thickening. No consolidative pulmonary opacities. No pleural effusion or pneumothorax.  Regional skeleton is unremarkable. No acute cardiopulmonary process.             Assessment & Plan:

## 2015-08-09 NOTE — Patient Instructions (Addendum)
Pace yourself with exercise with goal of keeping your 02 sats at or above 90%  Please schedule a follow up visit in 3 months but call sooner if needed  Inquire re use of noct 02 next ov

## 2015-08-16 ENCOUNTER — Other Ambulatory Visit: Payer: Self-pay | Admitting: Cardiology

## 2015-09-10 ENCOUNTER — Encounter: Payer: Self-pay | Admitting: Cardiology

## 2015-09-10 ENCOUNTER — Ambulatory Visit (INDEPENDENT_AMBULATORY_CARE_PROVIDER_SITE_OTHER): Payer: Medicare Other | Admitting: Cardiology

## 2015-09-10 VITALS — BP 116/62 | HR 86 | Ht 63.0 in | Wt 114.0 lb

## 2015-09-10 DIAGNOSIS — I471 Supraventricular tachycardia: Secondary | ICD-10-CM | POA: Diagnosis not present

## 2015-09-10 DIAGNOSIS — I10 Essential (primary) hypertension: Secondary | ICD-10-CM | POA: Diagnosis not present

## 2015-09-10 MED ORDER — FLECAINIDE ACETATE 50 MG PO TABS: 50.0000 mg | ORAL_TABLET | Freq: Two times a day (BID) | ORAL | Status: DC

## 2015-09-10 NOTE — Progress Notes (Signed)
Cardiology Office Note   Date:  09/10/2015   ID:  Carol Carson, DOB 05-06-1941, MRN [id number]  PCP:  Lottie Dawson, MD    Chief Complaint  Patient presents with  . Hypertension      History of Present Illness: Carol Carson is a 74 y.o. female with a history of SVT and HTN who presents today for followup. She is doing well. She denies any chest pain, LE edema or syncope. She has chronic SOB due to COPD which is stable. Occasionally she will have some skipped heart beats. She has only has one dizzy spell since I saw her last while standing but only lasted a few seconds.      Past Medical History  Diagnosis Date  . COPD (chronic obstructive pulmonary disease) ([place])   . SVT (supraventricular tachycardia) ([place])   . IBS (irritable bowel syndrome)   . DJD (degenerative joint disease)   . RLS (restless legs syndrome)   . Hyperlipidemia   . Chronic renal insufficiency   . Endometrial cancer ([place])   . Diverticulosis   . Uterine cancer ([place])   . Hypertension     Past Surgical History  Procedure Laterality Date  . Lung surgery  2002    golf ball sized tumor, left lung---spot vanished   . Abdominal hysterectomy      d/t unterine cancer///no chemo/no radiation     Current Outpatient Prescriptions  Medication Sig Dispense Refill  . ALPRAZolam (XANAX) 0.25 MG tablet Take 0.25 mg by mouth as needed for anxiety or sleep.   5  . amLODipine (NORVASC) 5 MG tablet Take 5 mg by mouth daily.   3  . atenolol (TENORMIN) 25 MG tablet Take 25 mg by mouth 2 (two) times daily.    . cholecalciferol (VITAMIN D) 1000 UNITS tablet Take 1,000 Units by mouth daily.    . flecainide (TAMBOCOR) 50 MG tablet TAKE 1 TABLET BY MOUTH TWICE DAILY 180 tablet 0  . levalbuterol (XOPENEX HFA) 45 MCG/ACT inhaler Inhale 2 puffs into the lungs every 4 (four) hours as needed for wheezing or shortness of breath.    . Multiple Vitamins-Minerals (CENTRUM SILVER PO) Take 1  tablet by mouth daily.    . NON FORMULARY Oxygen 2lpm at night    . simvastatin (ZOCOR) 20 MG tablet Take 20 mg by mouth every evening.    Marland Kitchen Umeclidinium-Vilanterol 62.5-25 MCG/INH AEPB Inhale 1 puff into the lungs daily. 3 each 3   No current facility-administered medications for this visit.    Allergies:   Contrast media; Biaxin; Penicillins; and Sulfa antibiotics    Social History:  The patient  reports that she quit smoking about 16 years ago. Her smoking use included Cigarettes. She has a 55.9 pack-year smoking history. She has never used smokeless tobacco. She reports that she does not drink alcohol or use illicit drugs.   Family History:  The patient's family history includes Uterine cancer in her mother.    ROS:  Please see the history of present illness.   Otherwise, review of systems are positive for none.   All other systems are reviewed and negative.    PHYSICAL EXAM: VS:  BP 116/62 mmHg  Pulse 86  Ht 5\' 3"  (1.6 m)  Wt 114 lb (51.71 kg)  BMI 20.20 kg/m2 , BMI Body mass index is 20.2 kg/(m^2). GEN: Well nourished, well developed, in no acute  distress HEENT: normal Neck: no JVD, carotid bruits, or masses Cardiac: RRR; no murmurs, rubs, or gallops,no edema  Respiratory:  Crackles at bases and anteriorly GI: soft, nontender, nondistended, + BS MS: no deformity or atrophy Skin: warm and dry, no rash Neuro:  Strength and sensation are intact Psych: euthymic mood, full affect   EKG:  EKG is not ordered today.    Recent Labs: No results found for requested labs within last 365 days.    Lipid Panel No results found for: CHOL, TRIG, HDL, CHOLHDL, VLDL, LDLCALC, LDLDIRECT    Wt Readings from Last 3 Encounters:  09/10/15 114 lb (51.71 kg)  08/09/15 114 lb (51.71 kg)  06/28/15 115 lb (52.164 kg)     ASSESSMENT AND PLAN:  1. SVT with no reoccurrence - continue Flecainide and Atenolol  2. HTN - well controlled - continue Amlodipine/Atenolol    Current  medicines are reviewed at length with the patient today.  The patient does not have concerns regarding medicines.  The following changes have been made:  no change  Labs/ tests ordered today: See above Assessment and Plan No orders of the defined types were placed in this encounter.     Disposition:   FU with me in 6 months  Signed, Sueanne Margarita, MD  09/10/2015 2:49 PM    [facility] [place], [place], [place]  [address] Phone: [phone]; Fax: [phone]

## 2015-09-10 NOTE — Patient Instructions (Signed)

## 2015-10-04 DIAGNOSIS — G2581 Restless legs syndrome: Secondary | ICD-10-CM | POA: Diagnosis not present

## 2015-10-04 DIAGNOSIS — G4700 Insomnia, unspecified: Secondary | ICD-10-CM | POA: Diagnosis not present

## 2015-10-04 DIAGNOSIS — F410 Panic disorder [episodic paroxysmal anxiety] without agoraphobia: Secondary | ICD-10-CM | POA: Diagnosis not present

## 2015-10-04 DIAGNOSIS — Z23 Encounter for immunization: Secondary | ICD-10-CM | POA: Diagnosis not present

## 2015-10-04 DIAGNOSIS — R202 Paresthesia of skin: Secondary | ICD-10-CM | POA: Diagnosis not present

## 2015-10-09 IMAGING — CR DG CHEST 2V
2 series · 2 of 2 positions shown · non-contrast
Comparison: Chest radiograph 01/13/2013

CLINICAL DATA: Patient with cough and shortness of breath.

EXAM:
CHEST  2 VIEW

[view not recorded (1 of 2)]
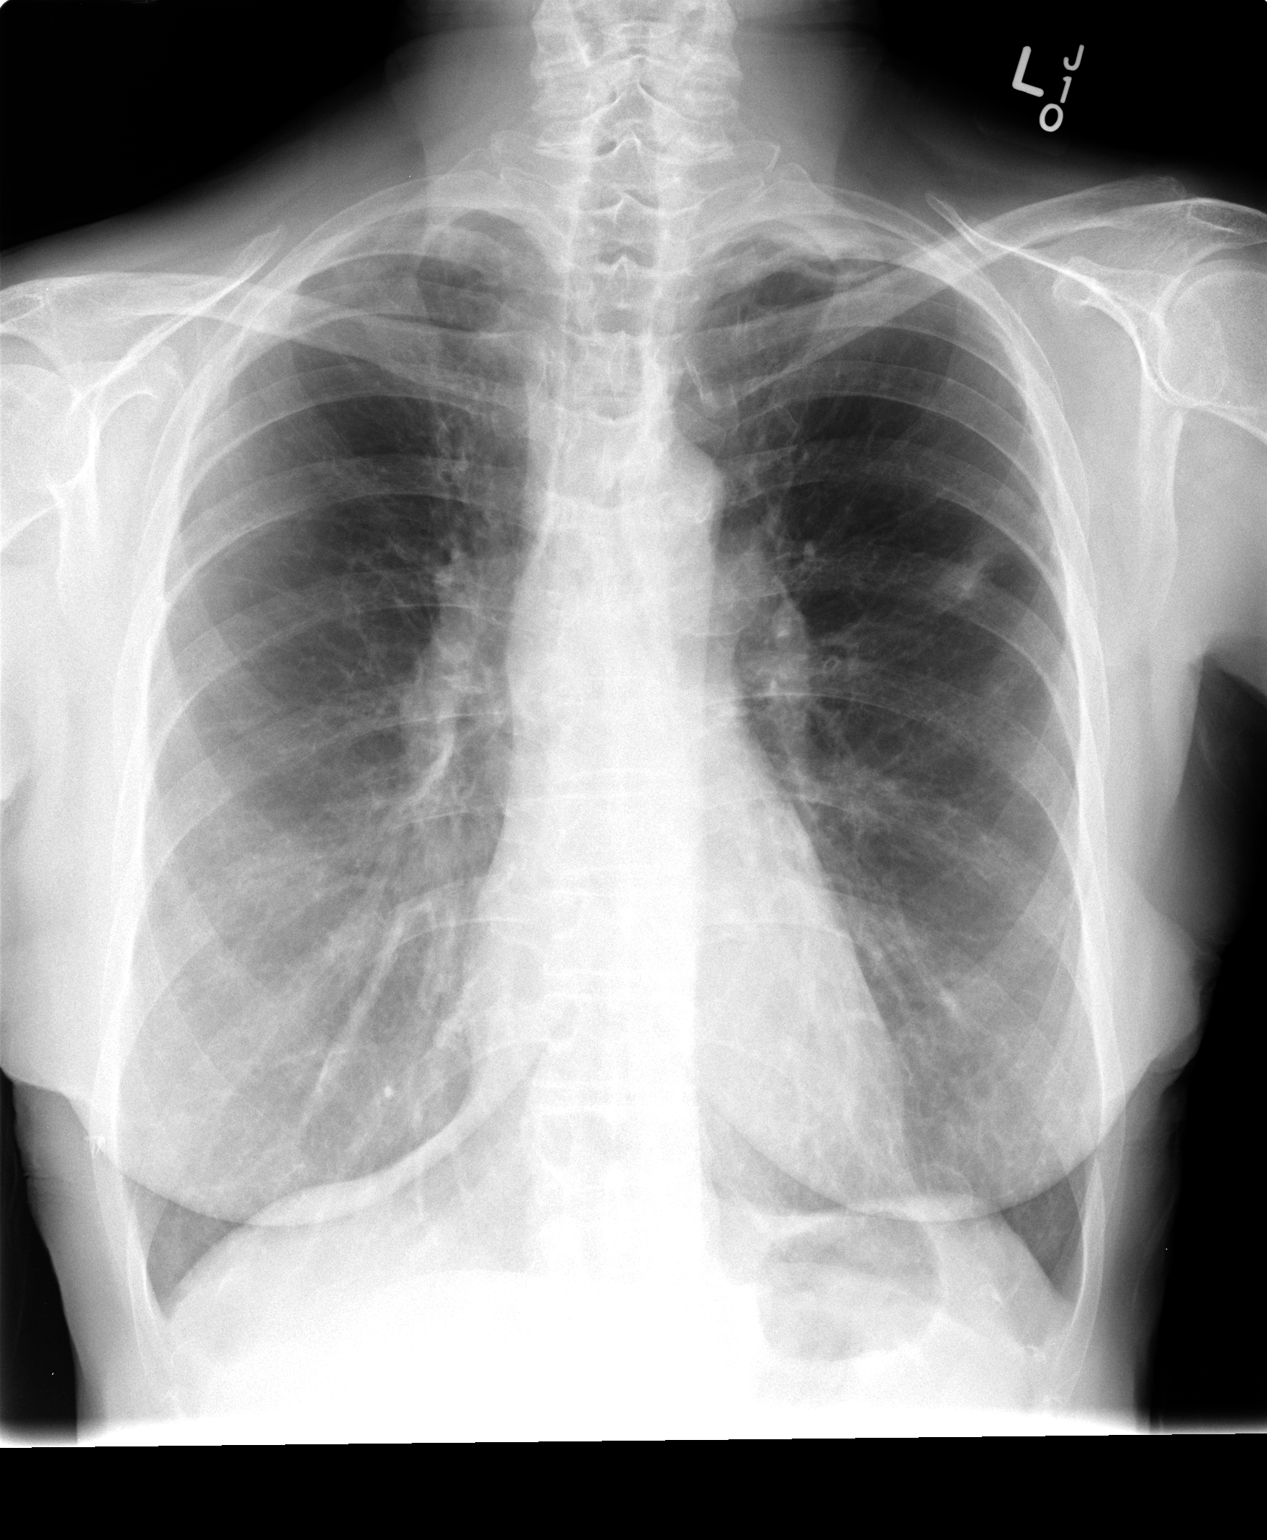

[view not recorded (2 of 2)]
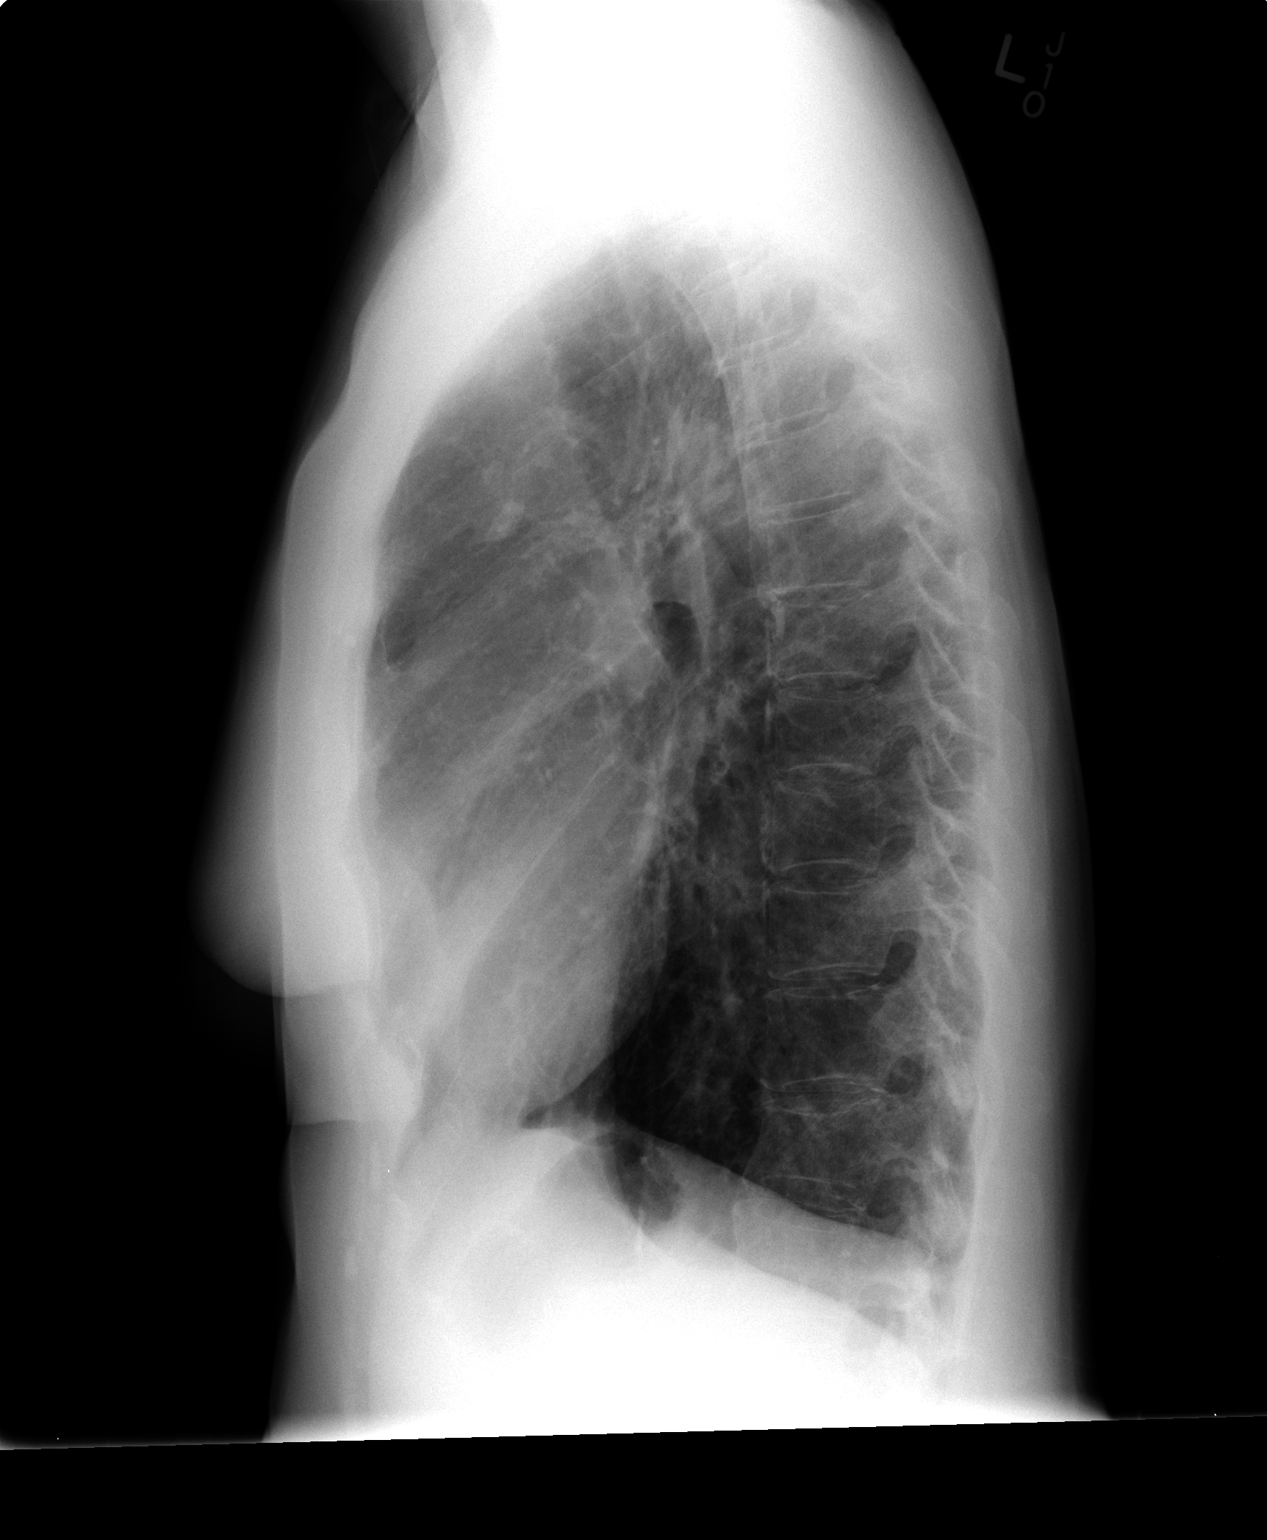

[2 of 2 positions shown; findings below may reference images not displayed]

FINDINGS: Stable cardiac and mediastinal contours. Unchanged nodular density
within the left mid lung. Biapical pleural parenchymal thickening.
No consolidative pulmonary opacities. No pleural effusion or
pneumothorax. Regional skeleton is unremarkable.
IMPRESSION: No acute cardiopulmonary process.

## 2015-10-10 ENCOUNTER — Ambulatory Visit (INDEPENDENT_AMBULATORY_CARE_PROVIDER_SITE_OTHER): Payer: Medicare Other | Admitting: Neurology

## 2015-10-10 ENCOUNTER — Other Ambulatory Visit (INDEPENDENT_AMBULATORY_CARE_PROVIDER_SITE_OTHER): Payer: Medicare Other

## 2015-10-10 ENCOUNTER — Encounter: Payer: Self-pay | Admitting: Neurology

## 2015-10-10 VITALS — BP 130/70 | HR 89 | Ht 63.0 in | Wt 113.6 lb

## 2015-10-10 DIAGNOSIS — R202 Paresthesia of skin: Secondary | ICD-10-CM

## 2015-10-10 DIAGNOSIS — G2581 Restless legs syndrome: Secondary | ICD-10-CM | POA: Diagnosis not present

## 2015-10-10 LAB — FERRITIN: Ferritin: 72.4 ng/mL (ref 10.0–291.0)

## 2015-10-10 MED ORDER — ROPINIROLE HCL 0.25 MG PO TABS: ORAL_TABLET | ORAL | Status: DC

## 2015-10-10 NOTE — Patient Instructions (Addendum)
1.  For restless leg syndrome, start ropinorole 0.25mg  - take 1 tablet 3 hours before bedtime for one week.  If there is no improvement, increase to 2 tablets before bedtime.   2.  If your leg tingling worsens, call my office and we can do additional testing. 3.  Check ferritin 4.  Call with an update in 3-4 weeks 5.  Return to clinic in 3 months

## 2015-10-10 NOTE — Progress Notes (Signed)
**Carol [name]** Carol Carol Carson - Initial Visit   Date: 10/10/2015  Carol Carol Carson MRN: [id number] DOB: Dec 17, 1940   Dear Dr. Kelton Pillar:  Thank you for your kind referral of Carol Carol Carson for consultation of bilateral feet paresthesias. Although her history is well known to you, please allow [place] to reiterate it for the purpose of our medical record. The patient was accompanied to the clinic by self.    History of Present Illness: Carol Carol Carson is a 74 y.o. right-handed Caucasian female with COPD, hyperlipidemia, SVT, h/o uterine cancer (1992), CKD, and depression presenting for evaluation of bilateral feet paresthesias.    On October 20th 2016, she was reading in bed and noticed tingling sensation involving both lower extremities, which last 30-min to an hour and then resolved.  It occurs daily for the first three days, but since has been improving.  She endorses low back pain.  No weakness.  She has a history of low back pain and was evaluated by spine surgery who said that she was not a candidate for surgery.  She was treated conservatively with physical therapy, epidural injections, and medication and slowly symptoms improved.   She had had restless leg syndrome since her late 75s, but it was much later in life when she was diagnosed.  She feels like bugs are crawling on her legs and has the urge to move her legs.  This is most bothersome at night, but over the past year it has also become bothersome during the day.  She has never taken medication, but feels that symptoms are daily and is interested in getting some relief now.  Sometimes, she cannot sleep for two nights because the discomfort is so severe.    Out-side paper records, electronic medical record, and images have been reviewed where available and summarized as:  Labs 10/04/2015:  TSH 1.40, vitamin B12 696, CBC within normal limits, Ns 138, K 4.8, Cr 1.77 GFR 28*  Past Medical History  Diagnosis Date    . COPD (chronic obstructive pulmonary disease) ([place])   . SVT (supraventricular tachycardia) ([place])   . IBS (irritable bowel syndrome)   . DJD (degenerative joint disease)   . RLS (restless legs syndrome)   . Hyperlipidemia   . Chronic renal insufficiency   . Endometrial cancer ([place])   . Diverticulosis   . Uterine cancer ([place])   . Hypertension     Past Surgical History  Procedure Laterality Date  . Lung surgery  2002    golf ball sized tumor, left lung---spot vanished   . Abdominal hysterectomy      d/t unterine cancer///no chemo/no radiation     Medications:  Outpatient Encounter Prescriptions as of 10/10/2015  Medication Sig Carol Carson  . ALPRAZolam (XANAX) 0.25 MG tablet Take 0.25 mg by mouth as needed for anxiety or sleep.  02/22/2015: Received from: External Pharmacy  . amLODipine (NORVASC) 5 MG tablet Take 5 mg by mouth daily.  02/22/2015: Received from: External Pharmacy Received Sig:   . atenolol (TENORMIN) 25 MG tablet Take 25 mg by mouth 2 (two) times daily.   . cholecalciferol (VITAMIN D) 1000 UNITS tablet Take 1,000 Units by mouth daily.   . flecainide (TAMBOCOR) 50 MG tablet Take 1 tablet (50 mg total) by mouth 2 (two) times daily.   Marland Kitchen levalbuterol (XOPENEX HFA) 45 MCG/ACT inhaler Inhale 2 puffs into the lungs every 4 (four) hours as needed for wheezing or shortness of breath.   . mirtazapine (REMERON) 15 MG  tablet TK 1 T PO QHS 10/10/2015: Received from: External Pharmacy  . Multiple Vitamins-Minerals (CENTRUM SILVER PO) Take 1 tablet by mouth daily.   . NON FORMULARY Oxygen 2lpm at night   . simvastatin (ZOCOR) 20 MG tablet Take 20 mg by mouth every evening.   Marland Kitchen Umeclidinium-Vilanterol 62.5-25 MCG/INH AEPB Inhale 1 puff into the lungs daily.   Marland Kitchen rOPINIRole (REQUIP) 0.25 MG tablet Take 1 tablet 3 hours before bedtime x 1 week, if no improvement, increase to 2 tablets at bedtime.    No facility-administered encounter medications on file as of 10/10/2015.     Allergies:   Allergies  Allergen Reactions  . Contrast Media [Iodinated Diagnostic Agents]     LOW KIDNEY FUNCTION///NEED TO CHECK WITH NEPHROLOGIST BEFORE PROCEDURE.  . Biaxin [Clarithromycin] Hives  . Penicillins Hives  . Sulfa Antibiotics Rash    Family History: Family History  Problem Relation Age of Onset  . Uterine cancer Mother   . Brain cancer Father   . Parkinson's disease Brother   . Aneurysm Sister     Brain aneusrym  . Stroke Sister   . Healthy Son     Social History: Social History  Substance Use Topics  . Smoking status: Former Smoker -- 1.30 packs/day for 43 years    Types: Cigarettes    Quit date: 12/08/1998  . Smokeless tobacco: Never Used  . Alcohol Use: No   Social History   Social History Narrative   Lives alone in a one story home.  Has 1 son.     Has not worked since the 60's.     Education: high school.    Review of Systems:  CONSTITUTIONAL: No fevers, chills, night sweats, or weight loss.   EYES: No visual changes or eye pain ENT: No hearing changes.  No history of nose bleeds.   RESPIRATORY: No cough, wheezing and shortness of breath.   CARDIOVASCULAR: Negative for chest pain, and palpitations.   GI: Negative for abdominal discomfort, blood in stools or black stools.  No recent change in bowel habits.   GU:  No history of incontinence.   MUSCLOSKELETAL: +history of joint pain or swelling.  No myalgias.   SKIN: Negative for lesions, rash, and itching.   HEMATOLOGY/ONCOLOGY: Negative for prolonged bleeding, bruising easily, and swollen nodes.  +history of cancer.   ENDOCRINE: Negative for cold or heat intolerance, polydipsia or goiter.   PSYCH:  +depression or anxiety symptoms.   NEURO: As Above.   Vital Signs:  BP 130/70 mmHg  Pulse 89  Ht 5\' 3"  (1.6 m)  Wt 113 lb 9 oz (51.512 kg)  BMI 20.12 kg/m2  SpO2 95%   General Medical Exam:   General:  Well appearing, comfortable.   Eyes/ENT: see cranial nerve examination.   Neck: No masses  appreciated.  Full range of motion without tenderness.  No carotid bruits. Respiratory:  Clear to auscultation, good air entry bilaterally.   Cardiac:  Regular rate and rhythm, no murmur.   Extremities:  No deformities, edema, or skin discoloration.  Skin:  No rashes or lesions.  Neurological Exam: MENTAL STATUS including orientation to time, place, person, recent and remote memory, attention span and concentration, language, and fund of knowledge is normal.  Speech is not dysarthric.  CRANIAL NERVES: II:  No visual field defects.  Unremarkable fundi.   III-IV-VI: Pupils equal round and reactive to light.  Normal conjugate, extra-ocular eye movements in all directions of gaze.  No nystagmus.  Subtle right  ptosis.   V:  Normal facial sensation.    VII:  Normal facial symmetry and movements.  No pathologic facial reflexes.  VIII:  Normal hearing and vestibular function.   IX-X:  Normal palatal movement.   XI:  Normal shoulder shrug and head rotation.   XII:  Normal tongue strength and range of motion, no deviation or fasciculation.  MOTOR:  No atrophy, fasciculations or abnormal movements.  No pronator drift.  Tone is normal.    Right Upper Extremity:    Left Upper Extremity:    Deltoid  5/5   Deltoid  5/5   Biceps  5/5   Biceps  5/5   Triceps  5/5   Triceps  5/5   Wrist extensors  5/5   Wrist extensors  5/5   Wrist flexors  5/5   Wrist flexors  5/5   Finger extensors  5/5   Finger extensors  5/5   Finger flexors  5/5   Finger flexors  5/5   Dorsal interossei  5/5   Dorsal interossei  5/5   Abductor pollicis  5/5   Abductor pollicis  5/5   Tone (Ashworth scale)  0  Tone (Ashworth scale)  0   Right Lower Extremity:    Left Lower Extremity:    Hip flexors  5/5   Hip flexors  5/5   Hip extensors  5/5   Hip extensors  5/5   Knee flexors  5/5   Knee flexors  5/5   Knee extensors  5/5   Knee extensors  5/5   Dorsiflexors  5/5   Dorsiflexors  5/5   Plantarflexors  5/5   Plantarflexors   5/5   Toe extensors  5/5   Toe extensors  5/5   Toe flexors  5/5   Toe flexors  5/5   Tone (Ashworth scale)  0  Tone (Ashworth scale)  0   MSRs:  Right                                                                 Left brachioradialis 2+  brachioradialis 2+  biceps 2+  biceps 2+  triceps 2+  triceps 2+  patellar 2+  patellar 2+  ankle jerk 2+  ankle jerk 2+  Hoffman no  Hoffman no  plantar response down  plantar response down   SENSORY:  Normal and symmetric perception of light touch, pinprick, vibration, and proprioception.  Romberg's sign absent.   COORDINATION/GAIT: Normal finger-to- nose-finger and heel-to-shin.  Intact rapid alternating movements bilaterally.  Able to rise from a chair without using arms.  Gait narrow based and stable. Tandem and stressed gait intact.    IMPRESSION: 1.  Restless leg syndrome, worsening.  I will check her ferritin level and if this is <50, plan to supplement with ferrous sulfate 325mg  daily In the meantime, start ropinorole 0.25mg  at 3 hours before bedtime for one week, then increase to 2 tablets.  Common side effects discussed.  She was instructed to call with an update in 1 month to determine further titration of ropinorole.   2.  Transient paresthesias of the lower extremities, ?L5 radiculopathy.  Symptoms are improving so will hold off on further treatment or investigations, however, if there is worsening symptoms, proceed with  MRI lumbar spine. No signs of peripheral neuropathy on her exam.    Return to clinic in 3 months.   The duration of this appointment visit was 40 minutes of face-to-face time with the patient.  Greater than 50% of this time was spent in counseling, explanation of diagnosis, planning of further management, and coordination of care.   Thank you for allowing me to participate in patient's care.  If I can answer any additional questions, I would be pleased to do so.    Sincerely,    Carol Mccomber K. Posey Pronto, DO

## 2015-10-12 NOTE — Progress Notes (Signed)
Left message informing patient.

## 2015-11-08 ENCOUNTER — Ambulatory Visit: Payer: Medicare Other | Admitting: Pulmonary Disease

## 2015-11-08 ENCOUNTER — Ambulatory Visit: Payer: Self-pay | Admitting: Internal Medicine

## 2015-11-08 ENCOUNTER — Ambulatory Visit: Payer: Self-pay | Admitting: Neurology

## 2015-11-09 ENCOUNTER — Ambulatory Visit (INDEPENDENT_AMBULATORY_CARE_PROVIDER_SITE_OTHER): Payer: Medicare Other | Admitting: Internal Medicine

## 2015-11-09 ENCOUNTER — Encounter: Payer: Self-pay | Admitting: Internal Medicine

## 2015-11-09 VITALS — BP 118/68 | HR 80 | Ht 63.0 in | Wt 116.6 lb

## 2015-11-09 DIAGNOSIS — J449 Chronic obstructive pulmonary disease, unspecified: Secondary | ICD-10-CM | POA: Diagnosis not present

## 2015-11-09 DIAGNOSIS — J9611 Chronic respiratory failure with hypoxia: Secondary | ICD-10-CM

## 2015-11-09 MED ORDER — TIOTROPIUM BROMIDE-OLODATEROL 2.5-2.5 MCG/ACT IN AERS: 2.0000 | INHALATION_SPRAY | Freq: Every day | RESPIRATORY_TRACT | Status: DC

## 2015-11-09 NOTE — Patient Instructions (Addendum)
Stop anoro and try stiolto 2 puffs each am instead   Pace yourself with walks   Please schedule a follow up visit in 3 months but call sooner if needed  Late add Recheck amb 02, consider refer to rehab next ov

## 2015-11-09 NOTE — Progress Notes (Signed)
Subjective:    Patient ID: Carol Carson, female    DOB: 1941-03-01   MRN: [id number]    Brief patient profile:  59 yowf quit smoking 2000  with GOLD III  COPD documented 2014    History of Present Illness  08/10/2014 NP  Follow up  Returns for a 2 week follow up for COPD flare  Tx with doxycycline and steroid taper .  She is feeling much better . Breathing has returned to her baseline.  rec No change rx anoro      06/28/2015 f/u ov/Lendon George re:  Copd/ anoro maint rx/ xopenex rescue  Chief Complaint  Patient presents with  . Acute Visit    started w/ a tickle in the throat, now a cough w/ a yellow mucus, gets tried easily w/ a few steps begins puffing  acutely worse since 7/17 and having to use xopenex sev times a day where at baseline not needing at all but does mostly relieve here sense of sob and chest tightness but not the tickle or coughing fits rec Continue anoro each am  Only use your xopenex as a rescue medication  Doxycycline x 7 days  Prednisone 10 mg take  4 each am x 2 days,   2 each am x 2 days,  1 each am x 2 days and stop  late add check walking sats    08/09/2015 f/u ov/Levoy Geisen re: copd GOLD III Chief Complaint  Patient presents with  . Follow-up    Pt states breathing is back to baseline. She states she has no new complaints at this time.   overall feels losing ground x one year and admits she's getting more and more sedendary/ has never done  Rehab  [place] MMRC 1 to 2 level Rarely using xopenex/ maint on anoro daily rec Pace yourself with exercise with goal of keeping your 02 sats at or above 90%        11/09/2015  f/u ov/Kanyon Seibold re: copd gold III / noct and ex desats chronically on maint anoro Chief Complaint  Patient presents with  . Follow-up    Pt c/o continued dyspnea with exertion. Pt denies cough/wheeze/CP/tightness.   rarely uses xopenex Could not do [employer] s stopping  MMRC3 = can't walk 100 yards even at a slow pace at a flat grade s stopping due to  sob     No obvious day to day or daytime variability or assoc excess/ purulent mucus   cp   or overt sinus or hb symptoms. No unusual exp hx or h/o childhood pna/ asthma or knowledge of premature birth.  Sleeping ok without nocturnal  or early am exacerbation  of respiratory  c/o's or need for noct saba. Also denies any obvious fluctuation of symptoms with weather or environmental changes or other aggravating or alleviating factors except as outlined above   Current Medications, Allergies, Complete Past Medical History, Past Surgical History, Family History, and Social History were reviewed in [employer] record.  ROS  The following are not active complaints unless bolded sore throat, dysphagia, dental problems, itching, sneezing,  nasal congestion or excess/ purulent secretions, ear ache,   fever, chills, sweats, unintended wt loss, classically pleuritic or exertional cp, hemoptysis,  orthopnea pnd or leg swelling, presyncope, palpitations, abdominal pain, anorexia, nausea, vomiting, diarrhea  or change in bowel or bladder habits, change in stools or urine, dysuria,hematuria,  rash, arthralgias, visual complaints, headache, numbness, weakness or ataxia or problems with walking or coordination,  change in mood/affect or memory.                Objective:   Physical Exam  Thin amb slt anxious wf nad   08/09/2015          114 > 11/09/2015   117  Wt Readings from Last 3 Encounters:  06/28/15 115 lb (52.164 kg)  02/22/15 120 lb 12.8 oz (54.795 kg)  11/07/14 120 lb 6.4 oz (54.613 kg)    Vital signs reviewed    HEENT: nl dentition, turbinates, and orophanx. Nl external ear canals without cough reflex   NECK :  without JVD/Nodes/TM/ nl carotid upstrokes bilaterally   LUNGS: no acc muscle use, distant bs bilaterally with min exp wheeze    CV:  RRR  no s3 or murmur or increase in P2, no edema   ABD:  soft and nontender with nl excursion in the supine position. No  bruits or organomegaly, bowel sounds nl  MS:  warm without deformities, calf tenderness, cyanosis or clubbing  SKIN: warm and dry without lesions    NEURO:  alert, approp, no deficits    I personally reviewed images and agree with radiology impression as follows:  CXR:  06/28/15 Stable cardiac and mediastinal contours. Unchanged nodular density within the left mid lung. Biapical pleural parenchymal thickening. No consolidative pulmonary opacities. No pleural effusion or pneumothorax. Regional skeleton is unremarkable. No acute cardiopulmonary process.             Assessment & Plan:

## 2015-11-11 ENCOUNTER — Encounter: Payer: Self-pay | Admitting: Internal Medicine

## 2015-11-11 NOTE — Assessment & Plan Note (Addendum)
ONO RA 01/2013:  Low sat 73%, 5 hrs spent less than or equal to 88%. - - 08/09/2015  Walked RA  2 laps @ 185 ft each stopped due to desat to 86%  but no sob/ slow pace   As of 11/09/2015 rx is 2lpm hs only (declined amb 0

## 2015-11-11 NOTE — Assessment & Plan Note (Signed)
-   PFT's 01/2013:  FEV1 0.77 (35%), ratio 55, ++airtrapping, no restriction, DLCO 32%   - 11/09/2015  extensive coaching HFA effectiveness =  90%   > try stiolto 2 each am instead of anoro     Hopefully changing from dpi to respimat will improve her well enough to improve ex tol and reduce her tendency to desats > check next ov  Also consider repeat pfts and rehab referral   .dsim

## 2015-11-16 ENCOUNTER — Telehealth: Payer: Self-pay | Admitting: Internal Medicine

## 2015-11-16 DIAGNOSIS — J449 Chronic obstructive pulmonary disease, unspecified: Secondary | ICD-10-CM

## 2015-11-16 MED ORDER — TIOTROPIUM BROMIDE-OLODATEROL 2.5-2.5 MCG/ACT IN AERS: 2.0000 | INHALATION_SPRAY | Freq: Every day | RESPIRATORY_TRACT | Status: DC

## 2015-11-16 NOTE — Telephone Encounter (Signed)
Pt needs 90 day rx of stiolto sent to mail order pharmacy. This has been sent.  Nothing further needed.

## 2015-11-16 NOTE — Telephone Encounter (Signed)
Pt forgot to mention.  Please call into [facility] mail order for [place].

## 2015-12-17 ENCOUNTER — Encounter: Payer: Self-pay | Admitting: Internal Medicine

## 2015-12-27 DIAGNOSIS — H26493 Other secondary cataract, bilateral: Secondary | ICD-10-CM | POA: Diagnosis not present

## 2016-01-07 DIAGNOSIS — H26491 Other secondary cataract, right eye: Secondary | ICD-10-CM | POA: Diagnosis not present

## 2016-01-15 DIAGNOSIS — N2581 Secondary hyperparathyroidism of renal origin: Secondary | ICD-10-CM | POA: Diagnosis not present

## 2016-01-15 DIAGNOSIS — N183 Chronic kidney disease, stage 3 (moderate): Secondary | ICD-10-CM | POA: Diagnosis not present

## 2016-01-15 DIAGNOSIS — D631 Anemia in chronic kidney disease: Secondary | ICD-10-CM | POA: Diagnosis not present

## 2016-01-15 DIAGNOSIS — R809 Proteinuria, unspecified: Secondary | ICD-10-CM | POA: Diagnosis not present

## 2016-01-15 DIAGNOSIS — N189 Chronic kidney disease, unspecified: Secondary | ICD-10-CM | POA: Diagnosis not present

## 2016-01-15 DIAGNOSIS — E785 Hyperlipidemia, unspecified: Secondary | ICD-10-CM | POA: Diagnosis not present

## 2016-01-31 ENCOUNTER — Ambulatory Visit (INDEPENDENT_AMBULATORY_CARE_PROVIDER_SITE_OTHER): Payer: Medicare Other | Admitting: Neurology

## 2016-01-31 ENCOUNTER — Encounter: Payer: Self-pay | Admitting: Neurology

## 2016-01-31 VITALS — BP 110/68 | HR 80 | Ht 63.0 in | Wt 117.0 lb

## 2016-01-31 DIAGNOSIS — G2581 Restless legs syndrome: Secondary | ICD-10-CM | POA: Diagnosis not present

## 2016-01-31 NOTE — Patient Instructions (Signed)
Continue ropinirole 0.25mg  at bedtime as you are taking Return to clinic in 1 year

## 2016-01-31 NOTE — Progress Notes (Signed)
Follow-up Visit   Date: 01/31/2016    Carol Carson MRN: [id number] DOB: 1941-10-28   Interim History: Carol Carson is a 75 y.o.  right-handed Caucasian female with COPD, hyperlipidemia, SVT, h/o uterine cancer (1992), CKD, and depression returning to the clinic for follow-up of RLS.  The patient was accompanied to the clinic by self.  History of present illness: On October 20th 2016, she was reading in bed and noticed tingling sensation involving both lower extremities, which last 30-min to an hour and then resolved. It occurs daily for the first three days, but since has been improving. She endorses low back pain. No weakness. She has a history of low back pain and was evaluated by spine surgery who said that she was not a candidate for surgery. She was treated conservatively with physical therapy, epidural injections, and medication and slowly symptoms improved.   She had had restless leg syndrome since her late 44s, but it was much later in life when she was diagnosed. She feels like bugs are crawling on her legs and has the urge to move her legs. This is most bothersome at night, but over the past year it has also become bothersome during the day. She has never taken medication, but feels that symptoms are daily and is interested in getting some relief now. Sometimes, she cannot sleep for two nights because the discomfort is so severe.   UPDATE 01/31/2016:  She is doing great on ropinirole 0.25mg  1 tablet at bedtime and noticed marked improvement within the first week.  She did not increase the dose because her symptoms are well controlled.  She is able to sleep much better throughout the night.  She has occasional tingling of the leg, but his is only noticeable when she is laying on her back and occurs very infrequently.  No interval hospitalizations or falls.  She has occasional sensation of tremor and weakness when holding objects, but these are brief.  No tremor at rest  or slowed movements.  Medications:  Current Outpatient Prescriptions on File Prior to Visit  Medication Sig Dispense Refill  . ALPRAZolam (XANAX) 0.25 MG tablet Take 0.25 mg by mouth as needed for anxiety or sleep.   5  . amLODipine (NORVASC) 5 MG tablet Take 5 mg by mouth daily.   3  . atenolol (TENORMIN) 25 MG tablet Take 25 mg by mouth 2 (two) times daily.    . cholecalciferol (VITAMIN D) 1000 UNITS tablet Take 1,000 Units by mouth daily.    . flecainide (TAMBOCOR) 50 MG tablet Take 1 tablet (50 mg total) by mouth 2 (two) times daily. 180 tablet 3  . levalbuterol (XOPENEX HFA) 45 MCG/ACT inhaler Inhale 2 puffs into the lungs every 4 (four) hours as needed for wheezing or shortness of breath.    . mirtazapine (REMERON) 15 MG tablet TK 1 T PO QHS  6  . Multiple Vitamins-Minerals (CENTRUM SILVER PO) Take 1 tablet by mouth daily.    . NON FORMULARY Oxygen 2lpm at night    . rOPINIRole (REQUIP) 0.25 MG tablet Take 1 tablet 3 hours before bedtime x 1 week, if no improvement, increase to 2 tablets at bedtime. (Patient taking differently: Take 0.25 mg by mouth at bedtime. ) 45 tablet 5  . simvastatin (ZOCOR) 20 MG tablet Take 20 mg by mouth every evening.    . Tiotropium Bromide-Olodaterol (STIOLTO RESPIMAT) 2.5-2.5 MCG/ACT AERS Inhale 2 puffs into the lungs daily. 3 Inhaler 3   No  current facility-administered medications on file prior to visit.    Allergies:  Allergies  Allergen Reactions  . Contrast Media [Iodinated Diagnostic Agents]     LOW KIDNEY FUNCTION///NEED TO CHECK WITH NEPHROLOGIST BEFORE PROCEDURE.  . Biaxin [Clarithromycin] Hives  . Penicillins Hives  . Sulfa Antibiotics Rash    Review of Systems:  CONSTITUTIONAL: No fevers, chills, night sweats, or weight loss.  EYES: No visual changes or eye pain ENT: No hearing changes.  No history of nose bleeds.   RESPIRATORY: No cough, wheezing and shortness of breath.   CARDIOVASCULAR: Negative for chest pain, and palpitations.     GI: Negative for abdominal discomfort, blood in stools or black stools.  No recent change in bowel habits.   GU:  No history of incontinence.   MUSCLOSKELETAL: No history of joint pain or swelling.  No myalgias.   SKIN: Negative for lesions, rash, and itching.   ENDOCRINE: Negative for cold or heat intolerance, polydipsia or goiter.   PSYCH:  No depression or anxiety symptoms.   NEURO: As Above.   Vital Signs:  BP 110/68 mmHg  Pulse 80  Ht 5\' 3"  (1.6 m)  Wt 117 lb (53.071 kg)  BMI 20.73 kg/m2  Neurological Exam: MENTAL STATUS including orientation to time, place, person, recent and remote memory, attention span and concentration, language, and fund of knowledge is normal.  Speech is not dysarthric.  CRANIAL NERVES:  Pupils equal round and reactive to light.  Normal conjugate, extra-ocular eye movements in all directions of gaze.  Face is symmetric. Palate elevates symmetrically.  Tongue is midline.  MOTOR:  Motor strength is 5/5 in all extremities. Tone is normal.  No tremor.  MSRs:  Reflexes are 2+/4 throughout.  SENSORY:  Intact to vibration throughout.  COORDINATION/GAIT:    Finger tapping intact.  Gait narrow based and stable.   Data: Labs 10/04/2015: TSH 1.40, vitamin B12 696, CBC within normal limits, Ns 138, K 4.8, Cr 1.77 GFR 28* Lab Results  Component Value Date   FERRITIN 72.4 10/10/2015    IMPRESSION/PLAN: 1. Restless leg syndrome, improved.  Ferritin levels are within normal limits Continue ropinorole 0.25mg  at 3 hours before bedtime, uptitrate as needed    2. Transient paresthesias of the lower extremities, ?L5 radiculopathy. Symptoms are improving so will hold off on further treatment or investigations, however, if there is worsening symptoms, proceed with MRI lumbar spine. No signs of peripheral neuropathy on her exam.   3.  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  Reassured her that she has no signs of parkinson's disease.   Return to clinic in 1  year   The duration of this appointment visit was 20 minutes of face-to-face time with the patient.  Greater than 50% of this time was spent in counseling, explanation of diagnosis, planning of further management, and coordination of care.   Thank you for allowing me to participate in patient's care.  If I can answer any additional questions, I would be pleased to do so.    Sincerely,    Donika K. Posey Pronto, DO

## 2016-02-07 ENCOUNTER — Ambulatory Visit: Payer: Self-pay | Admitting: Internal Medicine

## 2016-02-12 ENCOUNTER — Ambulatory Visit: Payer: Self-pay | Admitting: Internal Medicine

## 2016-02-15 ENCOUNTER — Encounter: Payer: Self-pay | Admitting: Internal Medicine

## 2016-02-15 ENCOUNTER — Ambulatory Visit (INDEPENDENT_AMBULATORY_CARE_PROVIDER_SITE_OTHER): Payer: Medicare Other | Admitting: Internal Medicine

## 2016-02-15 VITALS — BP 94/62 | HR 83 | Ht 63.0 in | Wt 118.4 lb

## 2016-02-15 DIAGNOSIS — J9611 Chronic respiratory failure with hypoxia: Secondary | ICD-10-CM

## 2016-02-15 DIAGNOSIS — J449 Chronic obstructive pulmonary disease, unspecified: Secondary | ICD-10-CM | POA: Diagnosis not present

## 2016-02-15 NOTE — Patient Instructions (Signed)
Continue anoro each am   Only use your xopenex as a rescue medication to be used if you can't catch your breath by resting or doing a relaxed purse lip breathing pattern.  - The less you use it, the better it will work when you need it. - Ok to use up to 2 puffs  every 4 hours if you must but call for immediate appointment if use goes up over your usual need - Don't leave home without it !!  (think of it like the spare tire for your car)    If you are satisfied with your treatment plan,  let your doctor know and he/she can either refill your medications or you can return here when your prescription runs out.     If in any way you are not 100% satisfied,  please tell us.  If 100% better, tell your friends!  Pulmonary follow up is as needed  Or yearly for refills

## 2016-02-15 NOTE — Assessment & Plan Note (Signed)
ONO RA 01/2013:  Low sat 73%, 5 hrs spent less than or equal to 88%. - 08/09/2015  Walked RA  2 laps @ 185 ft each stopped due to desat to 86%  but no sob/ slow pace  - 02/15/2016   Walked RA  2 laps @ 185 ft each stopped due to  desat to 86% with legs giving out not sob   As of 02/15/2016 rx is 2lpm hs only (declined amb 02 and rehab

## 2016-02-15 NOTE — Progress Notes (Signed)
Subjective:    Patient ID: Carol Carson, female    DOB: 12-15-1940   MRN: [id number]    Brief patient profile:  67 yowf quit smoking 2000  with GOLD III  COPD documented 2014    History of Present Illness  08/10/2014 NP  Follow up  Returns for a 2 week follow up for COPD flare  Tx with doxycycline and steroid taper .  She is feeling much better . Breathing has returned to her baseline.  rec No change rx anoro      06/28/2015 f/u ov/Sebrina Kessner re:  Copd/ anoro maint rx/ xopenex rescue  Chief Complaint  Patient presents with  . Acute Visit    started w/ a tickle in the throat, now a cough w/ a yellow mucus, gets tried easily w/ a few steps begins puffing  acutely worse since 7/17 and having to use xopenex sev times a day where at baseline not needing at all but does mostly relieve here sense of sob and chest tightness but not the tickle or coughing fits rec Continue anoro each am  Only use your xopenex as a rescue medication  Doxycycline x 7 days  Prednisone 10 mg take  4 each am x 2 days,   2 each am x 2 days,  1 each am x 2 days and stop  late add check walking sats    08/09/2015 f/u ov/Daivd Fredericksen re: copd GOLD III Chief Complaint  Patient presents with  . Follow-up    Pt states breathing is back to baseline. She states she has no new complaints at this time.   overall feels losing ground x one year and admits she's getting more and more sedendary/ has never done  Rehab  Doe MMRC 1 to 2 level Rarely using xopenex/ maint on anoro daily rec Pace yourself with exercise with goal of keeping your 02 sats at or above 90%        11/09/2015  f/u ov/Jazlyn Tippens re: copd gold III / noct and ex desats chronically on maint anoro Chief Complaint  Patient presents with  . Follow-up    Pt c/o continued dyspnea with exertion. Pt denies cough/wheeze/CP/tightness.   rarely uses xopenex Could not do [employer] s stopping  MMRC3 = can't walk 100 yards even at a slow pace at a flat grade s stopping due to  sob  rec Stop anoro and try stiolto 2 puffs each am instead  Pace yourself with walks  Late add Recheck amb 02, consider refer to rehab next ov    02/15/2016  f/u ov/Arli Bree re:  Copd III/ noct 02   maint rx with anoro and prn xopenex / mouth too dry on stiolto Chief Complaint  Patient presents with  . Follow-up    Breathing is unchanged. She is not coughing or wheezing much. She uses xopenex 2-3 x per wk on average.   only use xopenox p unusual exertion, never before desired activities  Sleeping ok on 02 2lpm    No obvious day to day or daytime variability or assoc excess/ purulent mucus  cp   or overt sinus or hb symptoms. No unusual exp hx or h/o childhood pna/ asthma or knowledge of premature birth.  Sleeping ok without nocturnal  or early am exacerbation  of respiratory  c/o's or need for noct saba. Also denies any obvious fluctuation of symptoms with weather or environmental changes or other aggravating or alleviating factors except as outlined above   Current Medications, Allergies, Complete Past  Medical History, Past Surgical History, Family History, and Social History were reviewed in [employer] record.  ROS  The following are not active complaints unless bolded sore throat, dysphagia, dental problems, itching, sneezing,  nasal congestion or excess/ purulent secretions, ear ache,   fever, chills, sweats, unintended wt loss, classically pleuritic or exertional cp, hemoptysis,  orthopnea pnd or leg swelling, presyncope, palpitations, abdominal pain, anorexia, nausea, vomiting, diarrhea  or change in bowel or bladder habits, change in stools or urine, dysuria,hematuria,  rash, arthralgias, visual complaints, headache, numbness, weakness or ataxia or problems with walking or coordination,  change in mood/affect or memory.                Objective:   Physical Exam  Thin amb somber  wf nad hopeless affect   08/09/2015          114 > 11/09/2015   117 >  02/15/2016  118     06/28/15 115 lb (52.164 kg)  02/22/15 120 lb 12.8 oz (54.795 kg)  11/07/14 120 lb 6.4 oz (54.613 kg)    Vital signs reviewed    HEENT: nl  turbinates, and orophanx. Nl external ear canals without cough reflex - full dentures    NECK :  without JVD/Nodes/TM/ nl carotid upstrokes bilaterally   LUNGS: no acc muscle use, distant bs bilaterally with min exp wheeze    CV:  RRR  no s3 or murmur or increase in P2, no edema   ABD:  soft and nontender with nl excursion in the supine position. No bruits or organomegaly, bowel sounds nl  MS:  warm without deformities, calf tenderness, cyanosis or clubbing  SKIN: warm and dry without lesions    NEURO:  alert, approp, no deficits    I personally reviewed images and agree with radiology impression as follows:  CXR:  06/28/15 Stable cardiac and mediastinal contours. Unchanged nodular density within the left mid lung. Biapical pleural parenchymal thickening. No consolidative pulmonary opacities. No pleural effusion or pneumothorax. Regional skeleton is unremarkable. No acute cardiopulmonary process.             Assessment & Plan:

## 2016-02-15 NOTE — Assessment & Plan Note (Signed)
-   PFT's 01/2013:  FEV1 0.77 (35%), ratio 55, ++airtrapping, no restriction, DLCO 32% - 11/09/2015  extensive coaching HFA effectiveness =    > try stiolto 2 each am instead of anoro > not able to to due to dry mouth   I had an extended final summary discussion with the patient reviewing all relevant studies completed to date and  lasting 15 to 20 minutes of a 25 minute visit on the following issues:    1) > 3 min discussion I reviewed the Fletcher curve with the patient that basically indicates  There is no medication on the market that has proven to alter the curve/ its downward trajectory  or the likelihood of progression of her disease.  Therefore stopping smoking and maintaining abstinence is the most important aspect of care, not choice of inhalers or for that matter, doctors.    2) therefore treatment is adjusted according to her symptoms, which she minimizes at present  3) Each maintenance medication was reviewed in detail including most importantly the difference between maintenance and as needed and under what circumstances the prns are to be used.  Please see instructions for details which were reviewed in writing and the patient given a copy.     Pulmonary f/u can be yearly and prn

## 2016-02-25 DIAGNOSIS — H26492 Other secondary cataract, left eye: Secondary | ICD-10-CM | POA: Diagnosis not present

## 2016-02-25 DIAGNOSIS — H539 Unspecified visual disturbance: Secondary | ICD-10-CM | POA: Diagnosis not present

## 2016-03-03 DIAGNOSIS — H539 Unspecified visual disturbance: Secondary | ICD-10-CM | POA: Diagnosis not present

## 2016-03-31 ENCOUNTER — Telehealth: Payer: Self-pay | Admitting: Internal Medicine

## 2016-03-31 MED ORDER — UMECLIDINIUM-VILANTEROL 62.5-25 MCG/INH IN AEPB: 1.0000 | INHALATION_SPRAY | Freq: Every day | RESPIRATORY_TRACT | Status: DC

## 2016-03-31 NOTE — Telephone Encounter (Signed)
Spoke with pt. She is needing a refill of Anoro sent to [facility]. This has been sent in. Nothing further was needed.

## 2016-05-15 ENCOUNTER — Other Ambulatory Visit: Payer: Self-pay | Admitting: Internal Medicine

## 2016-06-25 DIAGNOSIS — M8580 Other specified disorders of bone density and structure, unspecified site: Secondary | ICD-10-CM | POA: Diagnosis not present

## 2016-06-25 DIAGNOSIS — Z1231 Encounter for screening mammogram for malignant neoplasm of breast: Secondary | ICD-10-CM | POA: Diagnosis not present

## 2016-06-25 DIAGNOSIS — Z6821 Body mass index (BMI) 21.0-21.9, adult: Secondary | ICD-10-CM | POA: Diagnosis not present

## 2016-06-25 DIAGNOSIS — Z01411 Encounter for gynecological examination (general) (routine) with abnormal findings: Secondary | ICD-10-CM | POA: Diagnosis not present

## 2016-07-03 DIAGNOSIS — H26493 Other secondary cataract, bilateral: Secondary | ICD-10-CM | POA: Diagnosis not present

## 2016-07-03 DIAGNOSIS — Z0100 Encounter for examination of eyes and vision without abnormal findings: Secondary | ICD-10-CM | POA: Diagnosis not present

## 2016-07-04 ENCOUNTER — Other Ambulatory Visit: Payer: Self-pay | Admitting: Internal Medicine

## 2016-07-16 DIAGNOSIS — H26491 Other secondary cataract, right eye: Secondary | ICD-10-CM | POA: Diagnosis not present

## 2016-07-30 DIAGNOSIS — H26492 Other secondary cataract, left eye: Secondary | ICD-10-CM | POA: Diagnosis not present

## 2016-08-06 DIAGNOSIS — I471 Supraventricular tachycardia: Secondary | ICD-10-CM | POA: Diagnosis not present

## 2016-08-06 DIAGNOSIS — I10 Essential (primary) hypertension: Secondary | ICD-10-CM | POA: Diagnosis not present

## 2016-08-06 DIAGNOSIS — J449 Chronic obstructive pulmonary disease, unspecified: Secondary | ICD-10-CM | POA: Diagnosis not present

## 2016-08-06 DIAGNOSIS — N183 Chronic kidney disease, stage 3 (moderate): Secondary | ICD-10-CM | POA: Diagnosis not present

## 2016-08-06 DIAGNOSIS — F410 Panic disorder [episodic paroxysmal anxiety] without agoraphobia: Secondary | ICD-10-CM | POA: Diagnosis not present

## 2016-08-06 DIAGNOSIS — G2581 Restless legs syndrome: Secondary | ICD-10-CM | POA: Diagnosis not present

## 2016-08-06 DIAGNOSIS — M899 Disorder of bone, unspecified: Secondary | ICD-10-CM | POA: Diagnosis not present

## 2016-08-06 DIAGNOSIS — E785 Hyperlipidemia, unspecified: Secondary | ICD-10-CM | POA: Diagnosis not present

## 2016-08-06 DIAGNOSIS — Z0000 Encounter for general adult medical examination without abnormal findings: Secondary | ICD-10-CM | POA: Diagnosis not present

## 2016-08-20 ENCOUNTER — Other Ambulatory Visit: Payer: Self-pay | Admitting: Neurology

## 2016-08-20 NOTE — Telephone Encounter (Signed)
Rx sent 

## 2016-08-22 ENCOUNTER — Other Ambulatory Visit: Payer: Self-pay | Admitting: Internal Medicine

## 2016-09-12 ENCOUNTER — Other Ambulatory Visit: Payer: Self-pay | Admitting: Neurology

## 2016-09-12 NOTE — Telephone Encounter (Signed)
RX sent to pharmacy  

## 2016-09-14 ENCOUNTER — Other Ambulatory Visit: Payer: Self-pay | Admitting: Neurology

## 2016-09-15 ENCOUNTER — Ambulatory Visit (INDEPENDENT_AMBULATORY_CARE_PROVIDER_SITE_OTHER): Payer: Medicare Other | Admitting: Internal Medicine

## 2016-09-15 ENCOUNTER — Ambulatory Visit (INDEPENDENT_AMBULATORY_CARE_PROVIDER_SITE_OTHER)
Admission: RE | Admit: 2016-09-15 | Discharge: 2016-09-15 | Disposition: A | Discharge status: Home / Self Care | Payer: Medicare Other | Source: Ambulatory Visit | Attending: Internal Medicine | Admitting: Internal Medicine

## 2016-09-15 ENCOUNTER — Encounter: Payer: Self-pay | Admitting: Internal Medicine

## 2016-09-15 VITALS — BP 128/88 | HR 93 | Ht 63.0 in | Wt 119.2 lb

## 2016-09-15 DIAGNOSIS — J441 Chronic obstructive pulmonary disease with (acute) exacerbation: Secondary | ICD-10-CM

## 2016-09-15 DIAGNOSIS — J449 Chronic obstructive pulmonary disease, unspecified: Secondary | ICD-10-CM

## 2016-09-15 DIAGNOSIS — R05 Cough: Secondary | ICD-10-CM | POA: Diagnosis not present

## 2016-09-15 DIAGNOSIS — J9611 Chronic respiratory failure with hypoxia: Secondary | ICD-10-CM | POA: Diagnosis not present

## 2016-09-15 MED ORDER — PREDNISONE 10 MG PO TABS: ORAL_TABLET | ORAL | 0 refills | Status: DC

## 2016-09-15 MED ORDER — DOXYCYCLINE HYCLATE 100 MG PO TABS: 100.0000 mg | ORAL_TABLET | Freq: Two times a day (BID) | ORAL | 0 refills | Status: DC

## 2016-09-15 MED ORDER — LEVALBUTEROL HCL 0.63 MG/3ML IN NEBU
1.2600 mg | INHALATION_SOLUTION | Freq: Once | RESPIRATORY_TRACT | Status: AC
  Administered 2016-09-15: 1.26 mg via RESPIRATORY_TRACT

## 2016-09-15 NOTE — Progress Notes (Signed)
Subjective:    Patient ID: Carol Carson, female    DOB: 12/23/1940   MRN: [id number]    Brief patient profile:  36 yowf quit smoking 2000  with GOLD III  COPD documented 2014    History of Present Illness  08/10/2014 NP  Follow up  Returns for a 2 week follow up for COPD flare  Tx with doxycycline and steroid taper .  She is feeling much better . Breathing has returned to her baseline.  rec No change rx anoro      06/28/2015 f/u ov/Aisley Whan re:  Copd/ anoro maint rx/ xopenex rescue  Chief Complaint  Patient presents with  . Acute Visit    started w/ a tickle in the throat, now a cough w/ a yellow mucus, gets tried easily w/ a few steps begins puffing  acutely worse since 7/17 and having to use xopenex sev times a day where at baseline not needing at all but does mostly relieve here sense of sob and chest tightness but not the tickle or coughing fits rec Continue anoro each am  Only use your xopenex as a rescue medication  Doxycycline x 7 days  Prednisone 10 mg take  4 each am x 2 days,   2 each am x 2 days,  1 each am x 2 days and stop  late add check walking sats    08/09/2015 f/u ov/Remedios Mckone re: copd GOLD III Chief Complaint  Patient presents with  . Follow-up    Pt states breathing is back to baseline. She states she has no new complaints at this time.   overall feels losing ground x one year and admits she's getting more and more sedendary/ has never done  Rehab  Doe MMRC 1 to 2 level Rarely using xopenex/ maint on anoro daily rec Pace yourself with exercise with goal of keeping your 02 sats at or above 90%        11/09/2015  f/u ov/Solana Coggin re: copd gold III / noct and ex desats chronically on maint anoro Chief Complaint  Patient presents with  . Follow-up    Pt c/o continued dyspnea with exertion. Pt denies cough/wheeze/CP/tightness.   rarely uses xopenex Could not do [employer] s stopping  MMRC3 = can't walk 100 yards even at a slow pace at a flat grade s stopping due to  sob  rec Stop anoro and try stiolto 2 puffs each am instead  Pace yourself with walks  Late add Recheck amb 02, consider refer to rehab next ov    02/15/2016  f/u ov/Javontae Marlette re:  Copd III/ noct 02   maint rx with anoro and prn xopenex / mouth too dry on stiolto Chief Complaint  Patient presents with  . Follow-up    Breathing is unchanged. She is not coughing or wheezing much. She uses xopenex 2-3 x per wk on average.   only use xopenox p unusual exertion, never before desired activities  Sleeping ok on 02 2lpm  rec Continue anoro each am  Only use your xopenex as a rescue medication prn Pulmonary f/u is as needed if not 100% satisfied  06/2016 stopped doing treamill exercise    09/15/2016 acute extended ov/Cassie Henkels re:  Copd GOLD  III/ noct 02 maint anoro/prn xopnex  Chief Complaint  Patient presents with  . Acute Visit    Feeling SOB with exertion x thurs, Having troubling catching her breath, yellow sputum, some wheezing, Pt. denies chest pain, States inhaler is working  at baseline  can do HT but not [employer] on RA while maintaining on Anoro and rarely any xoponex  09/11/16 new onset  when wakes up to go to bathroom gets sob getting back to bed assoc with cough productive of yellow mucus - has not increased xoponex.   No obvious day to day or daytime variability or assoc  cp   or overt sinus or hb symptoms. No unusual exp hx or h/o childhood pna/ asthma or knowledge of premature birth.  Sleeping ok without nocturnal  or early am exacerbation  of respiratory  c/o's or need for noct saba. Also denies any obvious fluctuation of symptoms with weather or environmental changes or other aggravating or alleviating factors except as outlined above   Current Medications, Allergies, Complete Past Medical History, Past Surgical History, Family History, and Social History were reviewed in [employer] record.  ROS  The following are not active complaints unless bolded sore throat,  dysphagia, dental problems, itching, sneezing,  nasal congestion or excess/ purulent secretions, ear ache,   fever, chills, sweats, unintended wt loss, classically pleuritic or exertional cp, hemoptysis,  orthopnea pnd or leg swelling, presyncope, palpitations, abdominal pain, anorexia, nausea, vomiting, diarrhea  or change in bowel or bladder habits, change in stools or urine, dysuria,hematuria,  rash, arthralgias, visual complaints, headache, numbness, weakness or ataxia or problems with walking or coordination,  change in mood/affect or memory.                Objective:   Physical Exam  Thin amb somber wf    08/09/2015          114 > 11/09/2015   117 > 02/15/2016  118 > 09/21/2016   119      06/28/15 115 lb (52.164 kg)  02/22/15 120 lb 12.8 oz (54.795 kg)  11/07/14 120 lb 6.4 oz (54.613 kg)    Vital signs reviewed - Note on arrival 02 sats  83% on RA  - repeated and in upper 80's prior to leaving and refused amb 02    HEENT: nl  turbinates, and orophanx. Nl external ear canals without cough reflex - full dentures    NECK :  without JVD/Nodes/TM/ nl carotid upstrokes bilaterally   LUNGS: no acc muscle use, distant bs bilaterally with  Exp rhonchi/ mid expiratory rhonchi  CV:  RRR  no s3 or murmur or increase in P2, no edema   ABD:  soft and nontender with nl excursion in the supine position. No bruits or organomegaly, bowel sounds nl  MS:  warm without deformities, calf tenderness, cyanosis or clubbing  SKIN: warm and dry without lesions    NEURO:  alert, approp, no deficits     CXR PA and Lateral:   09/15/2016 :    I personally reviewed images and agree with radiology impression as follows:   1. No acute cardiopulmonary disease. 2. Mildly hyperinflated lungs and emphysema, suggesting COPD. 3. Aortic atherosclerosis.      Assessment & Plan:

## 2016-09-15 NOTE — Patient Instructions (Addendum)
Doxycycline 100 mg twice daily x 10 days  Prednisone 10 mg take  4 each am x 2 days,   2 each am x 2 days,  1 each am x 2 days and stop   Please schedule a follow up office visit in 2 weeks, sooner if needed

## 2016-09-21 NOTE — Assessment & Plan Note (Addendum)
See ov 08/05/14 /2015 Recurred 06/24/15 > see ov 06/28/15  Recurred 09/15/2016   > rxPrednisone x 6 days/ doxy   See copd dx/ happening less than once a year so no need to change rx yet  Does need to follow action plan as written.  I had an extended discussion with the patient reviewing all relevant studies completed to date and  lasting 15 to 20 minutes of a 25 minute visit    Each maintenance medication was reviewed in detail including most importantly the difference between maintenance and prns and under what circumstances the prns are to be triggered using an action plan format that is not reflected in the computer generated alphabetically organized AVS.    Please see instructions for details which were reviewed in writing and the patient given a copy highlighting the part that I personally wrote and discussed at today's ov.

## 2016-09-21 NOTE — Assessment & Plan Note (Signed)
ONO RA 01/2013:  Low sat 73%, 5 hrs spent less than or equal to 88%. - 08/09/2015  Walked RA  2 laps @ 185 ft each stopped due to desat to 86%  but no sob/ slow pace  - 02/15/2016   Walked RA  2 laps @ 185 ft each stopped due to  desat to 86% with legs giving out not sob   As of 09/15/2016 rx is 2lpm hs only (declined amb 02 )

## 2016-09-21 NOTE — Assessment & Plan Note (Signed)
-   PFT's 01/2013:  FEV1 0.77 (35%), ratio 55, ++airtrapping, no restriction, DLCO 32% - 11/09/2015   try stiolto 2 each am instead of anoro > not able to to due to dry mouth  - 09/15/2016  After extensive coaching HFA effectiveness =    75% (short Ti)   Pt is Group B in terms of symptom/risk and laba/lama therefore appropriate rx at this point but if has additional flares will need to add ICS

## 2016-09-25 ENCOUNTER — Encounter: Payer: Self-pay | Admitting: Cardiology

## 2016-09-29 ENCOUNTER — Encounter: Payer: Self-pay | Admitting: Internal Medicine

## 2016-09-29 ENCOUNTER — Ambulatory Visit (INDEPENDENT_AMBULATORY_CARE_PROVIDER_SITE_OTHER): Payer: Medicare Other | Admitting: Internal Medicine

## 2016-09-29 VITALS — BP 106/60 | HR 90 | Ht 63.5 in | Wt 119.0 lb

## 2016-09-29 DIAGNOSIS — J449 Chronic obstructive pulmonary disease, unspecified: Secondary | ICD-10-CM | POA: Diagnosis not present

## 2016-09-29 DIAGNOSIS — J9611 Chronic respiratory failure with hypoxia: Secondary | ICD-10-CM

## 2016-09-29 DIAGNOSIS — Z23 Encounter for immunization: Secondary | ICD-10-CM

## 2016-09-29 NOTE — Progress Notes (Signed)
Subjective:    Patient ID: Carol Carson, female    DOB: 1941-07-10   MRN: [id number]    Brief patient profile:  59 yowf quit smoking 2000  with GOLD III  COPD documented 2014    History of Present Illness  08/10/2014 NP  Follow up  Returns for a 2 week follow up for COPD flare  Tx with doxycycline and steroid taper .  She is feeling much better . Breathing has returned to her baseline.  rec No change rx anoro     06/28/2015 f/u ov/Carol Carson re:  Copd/ anoro maint rx/ xopenex rescue  Chief Complaint  Patient presents with  . Acute Visit    started w/ a tickle in the throat, now a cough w/ a yellow mucus, gets tried easily w/ a few steps begins puffing  acutely worse since 7/17 and having to use xopenex sev times a day where at baseline not needing at all but does mostly relieve here sense of sob and chest tightness but not the tickle or coughing fits rec Continue anoro each am  Only use your xopenex as a rescue medication  Doxycycline x 7 days  Prednisone 10 mg take  4 each am x 2 days,   2 each am x 2 days,  1 each am x 2 days and stop  late add check walking sats    08/09/2015 f/u ov/Carol Carson re: copd GOLD III Chief Complaint  Patient presents with  . Follow-up    Pt states breathing is back to baseline. She states she has no new complaints at this time.   overall feels losing ground x one year and admits she's getting more and more sedendary/ has never done  Rehab  Doe MMRC 1 to 2 level Rarely using xopenex/ maint on anoro daily rec Pace yourself with exercise with goal of keeping your 02 sats at or above 90%        11/09/2015  f/u ov/Carol Carson re: copd gold III / noct and ex desats chronically on maint anoro Chief Complaint  Patient presents with  . Follow-up    Pt c/o continued dyspnea with exertion. Pt denies cough/wheeze/CP/tightness.   rarely uses xopenex Could not do [employer] s stopping  MMRC3 = can't walk 100 yards even at a slow pace at a flat grade s stopping due to sob   rec Stop anoro and try stiolto 2 puffs each am instead  Pace yourself with walks  Late add Recheck amb 02, consider refer to rehab next ov    02/15/2016  f/u ov/Carol Carson re:  Copd III/ noct 02   maint rx with anoro and prn xopenex / mouth too dry on stiolto Chief Complaint  Patient presents with  . Follow-up    Breathing is unchanged. She is not coughing or wheezing much. She uses xopenex 2-3 x per wk on average.   only use xopenox p unusual exertion, never before desired activities  Sleeping ok on 02 2lpm  rec Continue anoro each am  Only use your xopenex as a rescue medication prn Pulmonary f/u is as needed if not 100% satisfied  06/2016 stopped doing treamill exercise    09/15/2016 acute extended ov/Carol Carson re:  Copd GOLD  III/ noct 02 maint anoro/prn xopnex  Chief Complaint  Patient presents with  . Acute Visit    Feeling SOB with exertion x thurs, Having troubling catching her breath, yellow sputum, some wheezing, Pt. denies chest pain, States inhaler is working  at baseline can  do HT but not [employer] on RA while maintaining on Anoro and rarely any xoponex  09/11/16 new onset  when wakes up to go to bathroom gets sob getting back to bed assoc with cough productive of yellow mucus - has not increased xoponex. rec Doxycycline 100 mg twice daily x 10 days Prednisone 10 mg take  4 each am x 2 days,   2 each am x 2 days,  1 each am x 2 days and stop     09/29/2016  f/u ov/Carol Carson re: copd gold III/ noct 02 / anoro and rare xoponex  Chief Complaint  Patient presents with  . Follow-up    Pt states her breathing is back to her normal baseline. She has occ wheezing.      still not doing treadmill but all other activities at baseline /sleeping ok on 02    No obvious day to day or daytime variability or assoc  excess/ purulent sputum or mucus plugs  cp   or overt sinus or hb symptoms. No unusual exp hx or h/o childhood pna/ asthma or knowledge of premature birth.  Sleeping ok without  nocturnal  or early am exacerbation  of respiratory  c/o's or need for noct saba. Also denies any obvious fluctuation of symptoms with weather or environmental changes or other aggravating or alleviating factors except as outlined above   Current Medications, Allergies, Complete Past Medical History, Past Surgical History, Family History, and Social History were reviewed in [employer] record.  ROS  The following are not active complaints unless bolded sore throat, dysphagia, dental problems, itching, sneezing,  nasal congestion or excess/ purulent secretions, ear ache,   fever, chills, sweats, unintended wt loss, classically pleuritic or exertional cp, hemoptysis,  orthopnea pnd or leg swelling, presyncope, palpitations, abdominal pain, anorexia, nausea, vomiting, diarrhea  or change in bowel or bladder habits, change in stools or urine, dysuria,hematuria,  rash, arthralgias, visual complaints, headache, numbness, weakness or ataxia or problems with walking or coordination,  change in mood/affect or memory.                Objective:   Physical Exam  Thin amb somber wf    08/09/2015          114 > 11/09/2015   117 > 02/15/2016  118 > 09/21/2016   119  > 09/29/2016   119     06/28/15 115 lb (52.164 kg)  02/22/15 120 lb 12.8 oz (54.795 kg)  11/07/14 120 lb 6.4 oz (54.613 kg)    Vital signs reviewed - - Note on arrival 02 sats  90% on RA     HEENT: nl  turbinates, and orophanx. Nl external ear canals without cough reflex - full dentures    NECK :  without JVD/Nodes/TM/ nl carotid upstrokes bilaterally   LUNGS: no acc muscle use, distant bs bilaterally without wheeze / rhonchi   CV:  RRR  no s3 or murmur or increase in P2, no edema   ABD:  soft and nontender with nl excursion in the supine position. No bruits or organomegaly, bowel sounds nl  MS:  warm without deformities, calf tenderness, cyanosis or clubbing  SKIN: warm and dry without lesions    NEURO:   alert, approp, no deficits     CXR PA and Lateral:   09/15/2016 :    I personally reviewed images and agree with radiology impression as follows:   1. No acute cardiopulmonary disease. 2. Mildly hyperinflated lungs and  emphysema, suggesting COPD. 3. Aortic atherosclerosis.      Assessment & Plan:

## 2016-09-29 NOTE — Patient Instructions (Addendum)
Try using your 02 when you do your treadmill exercise or monitor your saturations with goal of keeping it over 90%  Please see patient coordinator before you leave today  to schedule overnight 02 sat on 2lpm   No change  In your  anoro and use  as needed xopenex  Please schedule a follow up visit in 6  months but call sooner if needed

## 2016-09-29 NOTE — Assessment & Plan Note (Addendum)
ONO RA 01/2013:  Low sat 73%, 5 hrs spent less than or equal to 88%. - 08/09/2015  Walked RA  2 laps @ 185 ft each stopped due to desat to 86%  but no sob/ slow pace  - 02/15/2016   Walked RA  2 laps @ 185 ft each stopped due to  desat to 86% with legs giving out not sob     As of 09/29/2016  rx is 2lpm hs only (declined amb 02 )  Can use 02 from her concentrator if desats walking on treadmill at home since doesn't want the portable system but the goal is to keep sat > 90% by self monitoring  Also need to confirm 2lpm at hs is adequate

## 2016-09-30 NOTE — Assessment & Plan Note (Signed)
-   PFT's 01/2013:  FEV1 0.77 (35%), ratio 55, ++airtrapping, no restriction, DLCO 32% - 11/09/2015   try stiolto 2 each am instead of anoro > not able to to due to dry mouth  - 09/15/2016  After extensive coaching HFA effectiveness =    75% (short Ti)   I had an extended discussion with the patient reviewing all relevant studies completed to date and  lasting 15 to 20 minutes of a 25 minute visit on the following ongoing concerns:   1)  Pt is Group B in terms of symptom/risk and laba/lama therefore appropriate rx at this point.    2) Formulary restrictions will be an ongoing challenge for the forseable future and I would be happy to pick an alternative if the pt will first  provide me a list of them but pt  will need to return here for training for any new device that is required eg dpi vs hfa vs respimat.    In meantime we can always provide samples so the patient never runs out of any needed respiratory medications.   3) needs to get back to treadmill ex and use 02 from her concentrator with goal of sat >90 and maintain conditioning since does not want to do rehab  4) Each maintenance medication was reviewed in detail including most importantly the difference between maintenance and as needed and under what circumstances the prns are to be used.  Please see instructions for details which were reviewed in writing and the patient given a copy.

## 2016-10-03 DIAGNOSIS — J9610 Chronic respiratory failure, unspecified whether with hypoxia or hypercapnia: Secondary | ICD-10-CM | POA: Diagnosis not present

## 2016-10-04 ENCOUNTER — Encounter: Payer: Self-pay | Admitting: Internal Medicine

## 2016-10-08 ENCOUNTER — Telehealth: Payer: Self-pay | Admitting: Internal Medicine

## 2016-10-08 ENCOUNTER — Ambulatory Visit (INDEPENDENT_AMBULATORY_CARE_PROVIDER_SITE_OTHER): Payer: Medicare Other | Admitting: Cardiology

## 2016-10-08 ENCOUNTER — Encounter: Payer: Self-pay | Admitting: Cardiology

## 2016-10-08 VITALS — BP 130/60 | HR 93 | Ht 63.5 in | Wt 120.0 lb

## 2016-10-08 DIAGNOSIS — J9611 Chronic respiratory failure with hypoxia: Secondary | ICD-10-CM

## 2016-10-08 DIAGNOSIS — R079 Chest pain, unspecified: Secondary | ICD-10-CM | POA: Diagnosis not present

## 2016-10-08 DIAGNOSIS — I10 Essential (primary) hypertension: Secondary | ICD-10-CM | POA: Diagnosis not present

## 2016-10-08 DIAGNOSIS — I471 Supraventricular tachycardia: Secondary | ICD-10-CM

## 2016-10-08 DIAGNOSIS — I739 Peripheral vascular disease, unspecified: Secondary | ICD-10-CM | POA: Diagnosis not present

## 2016-10-08 NOTE — Patient Instructions (Signed)
Medication Instructions:  Your physician recommends that you continue on your current medications as directed. Please refer to the Current Medication list given to you today.   Labwork: None  Testing/Procedures: Your physician has requested that you have a lower extremity arterial duplex. During this test, ultrasound is used to evaluate arterial blood flow in the legs. Allow one hour for this exam. There are no restrictions or special instructions.   Your physician has requested that you have a lexiscan myoview. For further information please visit [email or web address]. Please follow instruction sheet, as given.  Follow-Up: Your physician wants you to follow-up in: 6 months with Dr. Radford Pax. You will receive a reminder letter in the mail two months in advance. If you don't receive a letter, please call our office to schedule the follow-up appointment.   Any Other Special Instructions Will Be Listed Below (If Applicable).     If you need a refill on your cardiac medications before your next appointment, please call your pharmacy.

## 2016-10-08 NOTE — Progress Notes (Signed)
Cardiology Office Note    Date:  10/08/2016   ID:  Carol Carson, DOB 1941-05-29, MRN [id number]  PCP:  Leeroy Cha, MD  Cardiologist:  Fransico Him, MD   Chief Complaint  Patient presents with  . Follow-up    SVT/HTN/CP and claudication    History of Present Illness:  Carol Carson is a 75 y.o. female with a history of SVT and HTN who presents today for followup. She is doing well but recently her SOB has gotten worse.  She is having more DOE.  She gets episode of chest tightness with minimal exertion.  She says that exercise makes her more SOB so she does not exercise and now is deconditioned.   She denies any LE edema or syncope. She occasionally gets dizzy if she is breathing to hard and fast with exertion.      Past Medical History:  Diagnosis Date  . Chronic renal insufficiency   . COPD (chronic obstructive pulmonary disease) ([place])   . Diverticulosis   . DJD (degenerative joint disease)   . Endometrial cancer ([place])   . Hyperlipidemia   . Hypertension   . IBS (irritable bowel syndrome)   . RLS (restless legs syndrome)   . SVT (supraventricular tachycardia) ([place])   . Uterine cancer [facility])     Past Surgical History:  Procedure Laterality Date  . ABDOMINAL HYSTERECTOMY     d/t unterine cancer///no chemo/no radiation  . LUNG SURGERY  2002   golf ball sized tumor, left lung---spot vanished     Current Medications: Outpatient Medications Prior to Visit  Medication Sig Dispense Refill  . ALPRAZolam (XANAX) 0.25 MG tablet Take 0.25 mg by mouth as needed for anxiety or sleep.   5  . amLODipine (NORVASC) 5 MG tablet Take 5 mg by mouth daily.   3  . atenolol (TENORMIN) 25 MG tablet Take 25 mg by mouth 2 (two) times daily.    . cholecalciferol (VITAMIN D) 1000 UNITS tablet Take 1,000 Units by mouth daily.    . flecainide (TAMBOCOR) 50 MG tablet Take 1 tablet (50 mg total) by mouth 2 (two) times daily. 180 tablet 3  . levalbuterol (XOPENEX HFA) 45 MCG/ACT  inhaler Inhale 2 puffs into the lungs every 4 (four) hours as needed for wheezing or shortness of breath.    . Multiple Vitamins-Minerals (CENTRUM SILVER PO) Take 1 tablet by mouth daily.    Marland Kitchen rOPINIRole (REQUIP) 0.25 MG tablet Take 1-2 tablets (0.25-0.5 mg total) by mouth at bedtime. 60 tablet 5  . simvastatin (ZOCOR) 20 MG tablet Take 20 mg by mouth every evening.    . umeclidinium-vilanterol (ANORO ELLIPTA) 62.5-25 MCG/INH AEPB Inhale 1 puff into the lungs daily. 180 each 3  . NON FORMULARY Oxygen 2lpm at night     No facility-administered medications prior to visit.      Allergies:   Contrast media [iodinated diagnostic agents]; Biaxin [clarithromycin]; Penicillins; and Sulfa antibiotics   Social History   Social History  . Marital status: Widowed    Spouse name: N/A  . Number of children: N/A  . Years of education: N/A   Occupational History  . retired    Social History Main Topics  . Smoking status: Former Smoker    Packs/day: 1.30    Years: 43.00    Types: Cigarettes    Quit date: 12/08/1998  . Smokeless tobacco: Never Used  . Alcohol use No  . Drug use: No  . Sexual activity: Not Asked  Other Topics Concern  . None   Social History Narrative   Lives alone in a one story home.  Has 1 son.     Has not worked since the 60's.     Education: high school.     Family History:  The patient's family history includes Aneurysm in her sister; Brain cancer in her father; Healthy in her son; Parkinson's disease in her brother; Stroke in her sister; Uterine cancer in her mother.   ROS:   Please see the history of present illness.    ROS All other systems reviewed and are negative.  No flowsheet data found.     PHYSICAL EXAM:   VS:  BP 130/60   Pulse 93   Ht 5' 3.5" (1.613 m)   Wt 120 lb (54.4 kg)   BMI 20.92 kg/m    GEN: Well nourished, well developed, in no acute distress  HEENT: normal  Neck: no JVD, carotid bruits, or masses Cardiac: RRR; no murmurs, rubs,  or gallops,no edema.  Trace distal pulses bilaterally.  Respiratory:  Diffuse fine crackles of emphysema GI: soft, nontender, nondistended, + BS MS: no deformity or atrophy  Skin: warm and dry, no rash Neuro:  Alert and Oriented x 3, Strength and sensation are intact Psych: euthymic mood, full affect  Wt Readings from Last 3 Encounters:  10/08/16 120 lb (54.4 kg)  09/29/16 119 lb (54 kg)  09/15/16 119 lb 3.2 oz (54.1 kg)      Studies/Labs Reviewed:   EKG:  EKG is ordered today.  The ekg ordered today demonstrates NSR at 93bpm with no ST changes with first degree AV block  Recent Labs: No results found for requested labs within last 8760 hours.   Lipid Panel No results found for: CHOL, TRIG, HDL, CHOLHDL, VLDL, LDLCALC, LDLDIRECT  Additional studies/ records that were reviewed today include:  none    ASSESSMENT:    1. SVT (supraventricular tachycardia) ([place])   2. Essential hypertension   3. Chest pain, unspecified type   4. Claudication [facility])      PLAN:  In order of problems listed above:  1. SVT with no reoccurrence.  Continue BB and flecainide. 2. HTN - BP controlled on current meds.  Continue amlodipine/BB. 3.   Chest pain - this is most likely related to her underlying COPD but has not had an ischemic workup in 4 years. She describes this as a tightness.   I will get a nuclear stress test to rule out ischemia.   4.   Claudication - she has reduced pulses so I will get LE arterial dopplers to assess.  Medication Adjustments/Labs and Tests Ordered: Current medicines are reviewed at length with the patient today.  Concerns regarding medicines are outlined above.  Medication changes, Labs and Tests ordered today are listed in the Patient Instructions below.  There are no Patient Instructions on file for this visit.   Signed, Fransico Him, MD  10/08/2016 3:41 PM    [facility] [place], [place], [place]  [address] Phone: [phone];  Fax: [phone]

## 2016-10-08 NOTE — Telephone Encounter (Signed)
Per MW ONO on 2lpm shows sats are still too low, need to have her increase o2 to 3lpm and recheck ONO on 3lpm  ATC, NA and no option to leave msg, [facility]

## 2016-10-09 NOTE — Telephone Encounter (Signed)
Spoke with pt and notified of results per Dr. Melvyn Novas. Pt verbalized understanding and denied any questions. Order sent to [facility] for ONO

## 2016-10-21 ENCOUNTER — Telehealth (HOSPITAL_COMMUNITY): Payer: Self-pay | Admitting: *Deleted

## 2016-10-21 NOTE — Telephone Encounter (Signed)
Patient given detailed instructions per Myocardial Perfusion Study Information Sheet for the test on 10/24/16 at [place]. Patient notified to arrive 15 minutes early and that it is imperative to arrive on time for appointment to keep from having the test rescheduled.  If you need to cancel or reschedule your appointment, please call the office within 24 hours of your appointment. Failure to do so may result in a cancellation of your appointment, and a $50 no show fee. Patient verbalized understanding.Keyia Moretto, Ranae [place]

## 2016-10-22 ENCOUNTER — Other Ambulatory Visit: Payer: Self-pay | Admitting: Cardiology

## 2016-10-22 DIAGNOSIS — I739 Peripheral vascular disease, unspecified: Secondary | ICD-10-CM

## 2016-10-24 ENCOUNTER — Ambulatory Visit (HOSPITAL_COMMUNITY): Payer: Medicare Other | Attending: Internal Medicine

## 2016-10-24 ENCOUNTER — Other Ambulatory Visit (INDEPENDENT_AMBULATORY_CARE_PROVIDER_SITE_OTHER): Payer: Medicare Other

## 2016-10-24 ENCOUNTER — Encounter: Payer: Self-pay | Admitting: Adult Health

## 2016-10-24 ENCOUNTER — Ambulatory Visit (INDEPENDENT_AMBULATORY_CARE_PROVIDER_SITE_OTHER): Payer: Medicare Other | Admitting: Adult Health

## 2016-10-24 VITALS — BP 110/60 | HR 96 | Temp 97.7°F | Ht 63.5 in | Wt 122.0 lb

## 2016-10-24 DIAGNOSIS — E785 Hyperlipidemia, unspecified: Secondary | ICD-10-CM | POA: Diagnosis not present

## 2016-10-24 DIAGNOSIS — J441 Chronic obstructive pulmonary disease with (acute) exacerbation: Secondary | ICD-10-CM

## 2016-10-24 DIAGNOSIS — J449 Chronic obstructive pulmonary disease, unspecified: Secondary | ICD-10-CM | POA: Diagnosis not present

## 2016-10-24 DIAGNOSIS — I10 Essential (primary) hypertension: Secondary | ICD-10-CM

## 2016-10-24 DIAGNOSIS — R079 Chest pain, unspecified: Secondary | ICD-10-CM

## 2016-10-24 DIAGNOSIS — J9611 Chronic respiratory failure with hypoxia: Secondary | ICD-10-CM

## 2016-10-24 DIAGNOSIS — R0602 Shortness of breath: Secondary | ICD-10-CM | POA: Diagnosis not present

## 2016-10-24 DIAGNOSIS — I509 Heart failure, unspecified: Secondary | ICD-10-CM

## 2016-10-24 LAB — BASIC METABOLIC PANEL
BUN: 12 mg/dL (ref 6–23)
CALCIUM: 9.8 mg/dL (ref 8.4–10.5)
CO2: 39 mEq/L — ABNORMAL HIGH (ref 19–32)
Chloride: 90 mEq/L — ABNORMAL LOW (ref 96–112)
Creatinine, Ser: 1.45 mg/dL — ABNORMAL HIGH (ref 0.40–1.20)
GFR: 37.31 mL/min — AB (ref >60.00)
Glucose, Bld: 102 mg/dL — ABNORMAL HIGH (ref 70–99)
Potassium: 3.9 mEq/L (ref 3.5–5.1)
SODIUM: 134 meq/L — AB (ref 135–145)

## 2016-10-24 LAB — MYOCARDIAL PERFUSION IMAGING
CHL CUP NUCLEAR SRS: 14
CHL CUP NUCLEAR SSS: 22
CSEPPHR: 100 {beats}/min
LV sys vol: 4 mL
LVDIAVOL: 40 mL (ref 46–106)
RATE: 0.29
Rest HR: 90 {beats}/min
SDS: 8
TID: 0.8

## 2016-10-24 LAB — CBC WITH DIFFERENTIAL/PLATELET
BASOS PCT: 0.3 % (ref 0.0–3.0)
Basophils Absolute: 0 10*3/uL (ref 0.0–0.1)
EOS PCT: 0.1 % (ref 0.0–5.0)
Eosinophils Absolute: 0 10*3/uL (ref 0.0–0.7)
HCT: 39.2 % (ref 36.0–46.0)
Hemoglobin: 13 g/dL (ref 12.0–15.0)
LYMPHS ABS: 1.3 10*3/uL (ref 0.7–4.0)
Lymphocytes Relative: 18.4 % (ref 12.0–46.0)
MCHC: 33.2 g/dL (ref 30.0–36.0)
MCV: 88.4 fl (ref 78.0–100.0)
MONO ABS: 0.5 10*3/uL (ref 0.1–1.0)
Monocytes Relative: 6.5 % (ref 3.0–12.0)
NEUTROS PCT: 74.7 % (ref 43.0–77.0)
Neutro Abs: 5.4 10*3/uL (ref 1.4–7.7)
PLATELETS: 191 10*3/uL (ref 150.0–400.0)
RBC: 4.44 Mil/uL (ref 3.87–5.11)
RDW: 13.3 % (ref 11.5–15.5)
WBC: 7.2 10*3/uL (ref 4.0–10.5)

## 2016-10-24 LAB — BRAIN NATRIURETIC PEPTIDE: PRO B NATRI PEPTIDE: 282 pg/mL — AB (ref 0.0–100.0)

## 2016-10-24 MED ORDER — PREDNISONE 10 MG PO TABS: ORAL_TABLET | ORAL | 0 refills | Status: DC

## 2016-10-24 MED ORDER — DOXYCYCLINE HYCLATE 100 MG PO TABS: 100.0000 mg | ORAL_TABLET | Freq: Two times a day (BID) | ORAL | 0 refills | Status: DC

## 2016-10-24 MED ORDER — TECHNETIUM TC 99M TETROFOSMIN IV KIT
32.9000 | PACK | Freq: Once | INTRAVENOUS | Status: AC | PRN
  Administered 2016-10-24: 32.9 via INTRAVENOUS
  Filled 2016-10-24: qty 33

## 2016-10-24 MED ORDER — TECHNETIUM TC 99M TETROFOSMIN IV KIT
10.2000 | PACK | Freq: Once | INTRAVENOUS | Status: AC | PRN
  Administered 2016-10-24: 10.2 via INTRAVENOUS
  Filled 2016-10-24: qty 11

## 2016-10-24 MED ORDER — REGADENOSON 0.4 MG/5ML IV SOLN
0.4000 mg | Freq: Once | INTRAVENOUS | Status: AC
  Administered 2016-10-24: 0.4 mg via INTRAVENOUS

## 2016-10-24 NOTE — Progress Notes (Signed)
Chart and office note reviewed in detail  > agree with a/p as outlined    

## 2016-10-24 NOTE — Patient Instructions (Addendum)
Begin Doxcycline 100mg  Twice daily  For 7 days  Begin Prednisone taper over next week.  Check  labs today .  Begin Oxygen 2l/m during daytime  Continue oxygen 3l/m At bedtime   Mucinex DM Twice daily  As needed  Cough/congestion  Follow up with Dr. Melvyn Novas  In 2 weeks and As needed   Please contact office for sooner follow up if symptoms do not improve or worsen or seek emergency care

## 2016-10-24 NOTE — Assessment & Plan Note (Signed)
Suspect progressively worsening COPD w/ recurrent exacerbation  Check labs with cbc, cnp  D dimer , if positive will need VQ scan as renal fxn excludes CTA chest   Plan  Patient Instructions  Begin Doxcycline 100mg  Twice daily  For 7 days  Begin Prednisone taper over next week.  Check  labs today .  Begin Oxygen 2l/m during daytime  Continue oxygen 3l/m At bedtime   Mucinex DM Twice daily  As needed  Cough/congestion  Follow up with Dr. Melvyn Novas  In 2 weeks and As needed   Please contact office for sooner follow up if symptoms do not improve or worsen or seek emergency care

## 2016-10-24 NOTE — Progress Notes (Signed)
Subjective:    Patient ID: Carol Carson, female    DOB: Feb 11, 1941   MRN: [id number]    Brief patient profile:  39 yowf quit smoking 2000  with GOLD III  COPD documented 2014    History of Present Illness  08/10/2014 NP  Follow up  Returns for a 2 week follow up for COPD flare  Tx with doxycycline and steroid taper .  She is feeling much better . Breathing has returned to her baseline.  rec No change rx anoro     06/28/2015 f/u ov/Wert re:  Copd/ anoro maint rx/ xopenex rescue  Chief Complaint  Patient presents with  . Acute Visit    started w/ a tickle in the throat, now a cough w/ a yellow mucus, gets tried easily w/ a few steps begins puffing  acutely worse since 7/17 and having to use xopenex sev times a day where at baseline not needing at all but does mostly relieve here sense of sob and chest tightness but not the tickle or coughing fits rec Continue anoro each am  Only use your xopenex as a rescue medication  Doxycycline x 7 days  Prednisone 10 mg take  4 each am x 2 days,   2 each am x 2 days,  1 each am x 2 days and stop  late add check walking sats    08/09/2015 f/u ov/Wert re: copd GOLD III Chief Complaint  Patient presents with  . Follow-up    Pt states breathing is back to baseline. She states she has no new complaints at this time.   overall feels losing ground x one year and admits she's getting more and more sedendary/ has never done  Rehab  Doe MMRC 1 to 2 level Rarely using xopenex/ maint on anoro daily rec Pace yourself with exercise with goal of keeping your 02 sats at or above 90%        11/09/2015  f/u ov/Wert re: copd gold III / noct and ex desats chronically on maint anoro Chief Complaint  Patient presents with  . Follow-up    Pt c/o continued dyspnea with exertion. Pt denies cough/wheeze/CP/tightness.   rarely uses xopenex Could not do [employer] s stopping  MMRC3 = can't walk 100 yards even at a slow pace at a flat grade s stopping due to sob   rec Stop anoro and try stiolto 2 puffs each am instead  Pace yourself with walks  Late add Recheck amb 02, consider refer to rehab next ov    02/15/2016  f/u ov/Wert re:  Copd III/ noct 02   maint rx with anoro and prn xopenex / mouth too dry on stiolto Chief Complaint  Patient presents with  . Follow-up    Breathing is unchanged. She is not coughing or wheezing much. She uses xopenex 2-3 x per wk on average.   only use xopenox p unusual exertion, never before desired activities  Sleeping ok on 02 2lpm  rec Continue anoro each am  Only use your xopenex as a rescue medication prn Pulmonary f/u is as needed if not 100% satisfied  06/2016 stopped doing treamill exercise    09/15/2016 acute extended ov/Wert re:  Copd GOLD  III/ noct 02 maint anoro/prn xopnex  Chief Complaint  Patient presents with  . Acute Visit    Feeling SOB with exertion x thurs, Having troubling catching her breath, yellow sputum, some wheezing, Pt. denies chest pain, States inhaler is working  at baseline can  do HT but not [employer] on RA while maintaining on Anoro and rarely any xoponex  09/11/16 new onset  when wakes up to go to bathroom gets sob getting back to bed assoc with cough productive of yellow mucus - has not increased xoponex. rec Doxycycline 100 mg twice daily x 10 days Prednisone 10 mg take  4 each am x 2 days,   2 each am x 2 days,  1 each am x 2 days and stop     09/29/2016  f/u ov/Wert re: copd gold III/ noct 02 / anoro and rare xoponex  Chief Complaint  Patient presents with  . Follow-up    Pt states her breathing is back to her normal baseline. She has occ wheezing.      still not doing treadmill but all other activities at baseline /sleeping ok on 02   >>no changes   10/24/2016 Acute OV : COPD GOLD III, on nocturnal O2.  Pt presents for an acute office visit . Says over last few months getting more sob with activity ,  Unable to less and less. For last 2 weeks Breathing has been getting  worse with severe DOE. She was seen by cardiology and had cardiac stress test this morning .  Stress test was low risk with nml EF . O2 sats have been running low at home.  She has had desats in past with walking into mid 80s on room air but did not want to start on o2 . Now sats are running in low 80s . On arrival today o2 sats 73% . She said they had to put her on O2 at stress test today so they could do test. She has o2 at home at home At bedtime  .  She was placed on 2 l/m of O2 with sats. >90%.  She complains of increased cough and congestion . Was treated with doxycycline and prednisone last month . CXR last month w/ chronic changes.  She denies chest pain, orthopnea, edema , n/v.d, hemoptyisis or fever.    Current Medications, Allergies, Complete Past Medical History, Past Surgical History, Family History, and Social History were reviewed in [employer] record.  ROS  The following are not active complaints unless bolded sore throat, dysphagia, dental problems, itching, sneezing,  nasal congestion or excess/ purulent secretions, ear ache,   fever, chills, sweats, unintended wt loss, classically pleuritic or exertional cp, hemoptysis,  orthopnea pnd or leg swelling, presyncope, palpitations, abdominal pain, anorexia, nausea, vomiting, diarrhea  or change in bowel or bladder habits, change in stools or urine, dysuria,hematuria,  rash, arthralgias, visual complaints, headache, numbness, weakness or ataxia or problems with walking or coordination,  change in mood/affect or memory.                Objective:   Physical Exam  Thin amb wf   08/09/2015          114 > 11/09/2015   117 > 02/15/2016  118 > 09/21/2016   119  > 09/29/2016   119    Vitals:   10/24/16 1541  BP: 110/60  Pulse: 96  Temp: 97.7 F (36.5 C)  TempSrc: Oral  SpO2: 94%  Weight: 122 lb (55.3 kg)  Height: 5' 3.5" (1.613 m)      Vital signs reviewed -   HEENT: nl  turbinates, and orophanx. Nl  external ear canals without cough reflex - full dentures    NECK :  without JVD/Nodes/TM/ nl carotid upstrokes  bilaterally   LUNGS: no acc muscle use, distant bs bilaterally without wheeze / rhonchi   CV:  RRR  no s3 or murmur or increase in P2, no edema   ABD:  soft and nontender with nl excursion in the supine position. No bruits or organomegaly, bowel sounds nl  MS:  warm without deformities, calf tenderness, cyanosis or clubbing  SKIN: warm and dry without lesions    NEURO:  alert, approp, no deficits     CXR PA and Lateral:   09/15/2016 :      1. No acute cardiopulmonary disease. 2. Mildly hyperinflated lungs and emphysema, suggesting COPD. 3. Aortic atherosclerosis.  Tammy Parrett NP-C  [facility]  10/24/2016

## 2016-10-24 NOTE — Assessment & Plan Note (Addendum)
Begin O2 during daytime at 2l/m  Order to DME .  Pt sent home with [facility] tank  [facility] to deliver tanks today .

## 2016-10-25 LAB — D-DIMER, QUANTITATIVE: D-Dimer, Quant: 0.42 mcg/mL FEU (ref <0.50)

## 2016-10-28 ENCOUNTER — Other Ambulatory Visit: Payer: Self-pay | Admitting: Adult Health

## 2016-10-28 DIAGNOSIS — J449 Chronic obstructive pulmonary disease, unspecified: Secondary | ICD-10-CM

## 2016-11-02 ENCOUNTER — Other Ambulatory Visit: Payer: Self-pay | Admitting: Cardiology

## 2016-11-03 ENCOUNTER — Ambulatory Visit (HOSPITAL_COMMUNITY)
Admission: RE | Admit: 2016-11-03 | Discharge: 2016-11-03 | Disposition: A | Discharge status: Home / Self Care | Payer: Medicare Other | Source: Ambulatory Visit | Attending: Cardiovascular Disease | Admitting: Cardiovascular Disease

## 2016-11-03 ENCOUNTER — Encounter: Payer: Self-pay | Admitting: Internal Medicine

## 2016-11-03 DIAGNOSIS — I739 Peripheral vascular disease, unspecified: Secondary | ICD-10-CM | POA: Diagnosis not present

## 2016-11-03 DIAGNOSIS — J9621 Acute and chronic respiratory failure with hypoxia: Secondary | ICD-10-CM | POA: Diagnosis not present

## 2016-11-10 ENCOUNTER — Ambulatory Visit: Payer: Self-pay | Admitting: Internal Medicine

## 2016-11-11 ENCOUNTER — Telehealth: Payer: Self-pay | Admitting: Internal Medicine

## 2016-11-11 NOTE — Telephone Encounter (Signed)
Per MW ONO on 3lpm (done by [facility] on 11/03/17) was normal, needs to continue to use o2 3lpm woth sleep  Spoke with pt and notified of results per Dr. Melvyn Novas. Pt verbalized understanding and denied any questions.

## 2016-11-12 ENCOUNTER — Encounter: Payer: Self-pay | Admitting: Internal Medicine

## 2016-11-12 ENCOUNTER — Ambulatory Visit (INDEPENDENT_AMBULATORY_CARE_PROVIDER_SITE_OTHER): Payer: Medicare Other | Admitting: Internal Medicine

## 2016-11-12 VITALS — BP 112/66 | HR 99 | Ht 63.0 in | Wt 120.8 lb

## 2016-11-12 DIAGNOSIS — J9611 Chronic respiratory failure with hypoxia: Secondary | ICD-10-CM | POA: Diagnosis not present

## 2016-11-12 DIAGNOSIS — J449 Chronic obstructive pulmonary disease, unspecified: Secondary | ICD-10-CM | POA: Diagnosis not present

## 2016-11-12 NOTE — Patient Instructions (Addendum)
Continue anoro each am   Please see patient coordinator before you leave today  to schedule ambulatory 02 evaluation to see if you are eligible for POC  Only use your xopenex as a rescue medication to be used if you can't catch your breath by resting or doing a relaxed purse lip breathing pattern.  - The less you use it, the better it will work when you need it. - Ok to use up to 2 puffs  every 4 hours if you must but call for immediate appointment if use goes up over your usual need - Don't leave home without it !!  (think of it like the spare tire for your car)    If you are satisfied with your treatment plan,  let your doctor know and he/she can either refill your medications or you can return here when your prescription runs out - for example to recertify you for 02 should it be needed or refills if your primary care doctor prefers.

## 2016-11-12 NOTE — Progress Notes (Signed)
Subjective:    Patient ID: Carol Carson, female    DOB: January 09, 1941   MRN: [id number]    Brief patient profile:  68 yowf quit smoking 2000  with GOLD III  COPD documented 2014    History of Present Illness  08/10/2014 NP  Follow up  Returns for a 2 week follow up for COPD flare  Tx with doxycycline and steroid taper .  She is feeling much better . Breathing has returned to her baseline.  rec No change rx anoro     06/28/2015 f/u ov/Carol Carson re:  Copd/ anoro maint rx/ xopenex rescue  Chief Complaint  Patient presents with  . Acute Visit    started w/ a tickle in the throat, now a cough w/ a yellow mucus, gets tried easily w/ a few steps begins puffing  acutely worse since 7/17 and having to use xopenex sev times a day where at baseline not needing at all but does mostly relieve here sense of sob and chest tightness but not the tickle or coughing fits rec Continue anoro each am  Only use your xopenex as a rescue medication  Doxycycline x 7 days  Prednisone 10 mg take  4 each am x 2 days,   2 each am x 2 days,  1 each am x 2 days and stop  late add check walking sats    08/09/2015 f/u ov/Carol Carson re: copd GOLD III Chief Complaint  Patient presents with  . Follow-up    Pt states breathing is back to baseline. She states she has no new complaints at this time.   overall feels losing ground x one year and admits she's getting more and more sedendary/ has never done  Rehab  Doe MMRC 1 to 2 level Rarely using xopenex/ maint on anoro daily rec Pace yourself with exercise with goal of keeping your 02 sats at or above 90%        11/09/2015  f/u ov/Carol Carson re: copd gold III / noct and ex desats chronically on maint anoro Chief Complaint  Patient presents with  . Follow-up    Pt c/o continued dyspnea with exertion. Pt denies cough/wheeze/CP/tightness.   rarely uses xopenex Could not do [employer] s stopping  MMRC3 = can't walk 100 yards even at a slow pace at a flat grade s stopping due to sob   rec Stop anoro and try stiolto 2 puffs each am instead  Pace yourself with walks  Late add Recheck amb 02, consider refer to rehab next ov    02/15/2016  f/u ov/Carol Carson re:  Copd III/ noct 02   maint rx with anoro and prn xopenex / mouth too dry on stiolto Chief Complaint  Patient presents with  . Follow-up    Breathing is unchanged. She is not coughing or wheezing much. She uses xopenex 2-3 x per wk on average.   only use xopenox p unusual exertion, never before desired activities  Sleeping ok on 02 2lpm  rec Continue anoro each am  Only use your xopenex as a rescue medication prn Pulmonary f/u is as needed if not 100% satisfied  06/2016 stopped doing treamill exercise    09/15/2016 acute extended ov/Carol Carson re:  Copd GOLD  III/ noct 02 maint anoro/prn xopnex  Chief Complaint  Patient presents with  . Acute Visit    Feeling SOB with exertion x thurs, Having troubling catching her breath, yellow sputum, some wheezing, Pt. denies chest pain, States inhaler is working  at baseline can  do HT but not [employer] on RA while maintaining on Anoro and rarely any xoponex  09/11/16 new onset  when wakes up to go to bathroom gets sob getting back to bed assoc with cough productive of yellow mucus - has not increased xoponex. rec Doxycycline 100 mg twice daily x 10 days Prednisone 10 mg take  4 each am x 2 days,   2 each am x 2 days,  1 each am x 2 days and stop  Declined amb 02     09/29/2016  f/u ov/Carol Carson re: copd gold III/ noct 02 / anoro and rare xoponex  Chief Complaint  Patient presents with  . Follow-up    Pt states her breathing is back to her normal baseline. She has occ wheezing.    still not doing treadmill but all other activities at baseline /sleeping ok on 02  rec Try using your 02 when you do your treadmill exercise or monitor your saturations with goal of keeping it over 90%  schedule overnight 02 sat on 2lpm > desats on 2lpm so increased to 3lpm No change  In your  anoro and use   as needed xopenex Declined amb 02      10/24/2016 NP Acute OV : COPD GOLD III, on nocturnal O2.  Pt presents for an acute office visit . Says over last few months getting more sob with activity ,  Unable to less and less. For last 2 weeks Breathing has been getting worse with severe DOE. She was seen by cardiology and had cardiac stress test this morning .  Stress test was low risk with nml EF . O2 sats have been running low at home.  She has had desats in past with walking into mid 80s on room air but did not want to start on o2 . Now sats are running in low 80s . On arrival today o2 sats 73% . She said they had to put her on O2 at stress test today so they could do test. She has o2 at home at home At bedtime  .  She was placed on 2 l/m of O2 with sats. >90%.  She complains of increased cough and congestion . Was treated with doxycycline and prednisone last month  rec Begin Doxcycline 100mg  Twice daily  For 7 days  Begin Prednisone taper over next week.  Check  labs today .  Begin Oxygen 2l/m during daytime  Continue oxygen 3l/m At bedtime   Mucinex DM Twice daily  As needed  Cough/congestion     11/12/2016  Extended summary f/u ov/Carol Carson re: copd III /anoro  Chief Complaint  Patient presents with  . Follow-up    Breathing has improved with supplemental o2 use.    cough resolved p above rx/ says 02 helping ex tol but not the xopenex (note that was not the purpose of the xopenex as expressed to her previously in office and in writing. / has not resume exercise as prev rec and refuses rehab   No obvious day to day or daytime variability or assoc excess/ purulent sputum or mucus plugs or hemoptysis or cp or chest tightness, subjective wheeze or overt sinus or hb symptoms. No unusual exp hx or h/o childhood pna/ asthma or knowledge of premature birth.  Sleeping ok without nocturnal  or early am exacerbation  of respiratory  c/o's or need for noct saba. Also denies any obvious fluctuation of  symptoms with weather or environmental changes or other aggravating or alleviating factors  except as outlined above   Current Medications, Allergies, Complete Past Medical History, Past Surgical History, Family History, and Social History were reviewed in [employer] record.  ROS  The following are not active complaints unless bolded sore throat, dysphagia, dental problems, itching, sneezing,  nasal congestion or excess/ purulent secretions, ear ache,   fever, chills, sweats, unintended wt loss, classically pleuritic or exertional cp,  orthopnea pnd or leg swelling, presyncope, palpitations, abdominal pain, anorexia, nausea, vomiting, diarrhea  or change in bowel or bladder habits, change in stools or urine, dysuria,hematuria,  rash, arthralgias, visual complaints, headache, numbness, weakness or ataxia or problems with walking or coordination,  change in mood/affect or memory.                   Objective:   Physical Exam  Thin amb wf failed to answer  questions asked in a straightforward manner, tending to go off on tangents or answer questions with ambiguous medical terms or diagnoses and seemed aggravated  when asked the same question more than once for clarification.  "my xopenex doesn't raise my 02 saturations so it's no good"   08/09/2015  114 > 11/09/2015   117 > 02/15/2016  118 > 09/21/2016   119 > 09/29/2016   119> 11/12/2016  121    Vital signs reviewed - - Note on arrival 02 sats  95% on 2lpm      HEENT: nl  turbinates, and orophanx. Nl external ear canals without cough reflex - full dentures    NECK :  without JVD/Nodes/TM/ nl carotid upstrokes bilaterally   LUNGS: no acc muscle use, distant bs bilaterally without wheeze / rhonchi   CV:  RRR  no s3 or murmur or increase in P2, no edema   ABD:  soft and nontender with nl excursion in the supine position. No bruits or organomegaly, bowel sounds nl  MS:  warm without deformities, calf tenderness,  cyanosis or clubbing  SKIN: warm and dry without lesions    NEURO:  alert, depressed mood/ affect , no motor  Deficits with nl gait

## 2016-11-13 NOTE — Assessment & Plan Note (Signed)
-   PFT's 01/2013:  FEV1 0.77 (35%), ratio 55, ++airtrapping, no restriction, DLCO 32% - 11/09/2015   try stiolto 2 each am instead of anoro > not able to to due to dry mouth  - 09/15/2016  After extensive coaching HFA effectiveness =    75% (short Ti)  I had an extended discussion with the patient reviewing all relevant studies completed to date and  lasting 25 minutes of a 40  minute final summary office visit  re  Severe but stable copd  1) I rec rehab but she declined  2) I prev rec amb 02 but she twice declined and seems upset today that she didn't get it until the cardiac w/u was done and now improved activity tol but not yet back to any kind of ex program  3) Pt is Group B in terms of symptom/risk and laba/lama therefore appropriate rx at this point= anoro Should she start having more freq ex would need breo/incruse or Trelegy   4) Each maintenance medication was reviewed in detail including most importantly the difference between maintenance and as needed and under what circumstances the prns are to be used.  Please see AVS for  customized  Instructions which were reviewed in detail in writing with the patient and a copy provided.    5) pulmonary f/u is prn flares/ refills or recertification of 02         .

## 2016-11-13 NOTE — Assessment & Plan Note (Signed)
ONO RA 01/2013:  Low sat 73%, 5 hrs spent less than or equal to 88%. - 08/09/2015  Walked RA  2 laps @ 185 ft each stopped due to desat to 86%  but no sob/ slow pace  - 02/15/2016   Walked RA  2 laps @ 185 ft each stopped due to  desat to 86% with legs giving out not sob  - ONO on 2lpm  10/03/16  desat <89% x 55min so rec 3lpm and repeated 11/03/16 desat x 63m so no change rx   - started on amb 02 p outpt cardiac w/u 10/24/16 2lpm 24/7 except 3lpm with sleep   Adequate control on present rx, reviewed in detail with pt > no change in rx needed

## 2016-11-14 ENCOUNTER — Telehealth: Payer: Self-pay | Admitting: Internal Medicine

## 2016-11-14 DIAGNOSIS — J449 Chronic obstructive pulmonary disease, unspecified: Secondary | ICD-10-CM

## 2016-11-14 NOTE — Telephone Encounter (Signed)
Called and spoke with pt and she is aware of order that has been placed for the eval of the POC.  Nothing further is needed.

## 2016-11-14 NOTE — Telephone Encounter (Signed)
Meant to order ambulatory 02 titration to see if eligible for POC   Dx chronic resp failure

## 2016-11-14 NOTE — Telephone Encounter (Signed)
MW  Please Advise-  Pt stated that she forgot to see the [facility] before she left. Per her instructions (below) it does state that you wanted her to see the [facility] but nothing was ordered.   Continue anoro each am   Please see patient coordinator before you leave today  to schedule ambulatory 02 evaluation to see if you are eligible for POC  Only use your xopenex as a rescue medication to be used if you can't catch your breath by resting or doing a relaxed purse lip breathing pattern.  - The less you use it, the better it will work when you need it. - Ok to use up to 2 puffs  every 4 hours if you must but call for immediate appointment if use goes up over your usual need - Don't leave home without it !!  (think of it like the spare tire for your car)    If you are satisfied with your treatment plan,  let your doctor know and he/she can either refill your medications or you can return here when your prescription runs out - for example to recertify you for 02 should it be needed or refills if your primary care doctor prefers.

## 2016-12-03 ENCOUNTER — Telehealth: Payer: Self-pay | Admitting: Internal Medicine

## 2016-12-03 NOTE — Telephone Encounter (Signed)
Spoke with pt, who states she feels she is having a COPD flare. Pt states she has been monitoring her O2 level, O2 is dropping in the 70's on 2L with any exertion.  Pt states at rest her O2 level will get up to 92. Pt also c/o increased sob, dry cough at times prod with yellow mucus and mild wheezing. I have advised pt to go to the ED, pt states she does not want to go to the ED. I offered pt apt with MW for 12/04/16, pt states she would rather not see MW. I then again advised pt again to go to the ED, as we did not have any openings today or tomorrow, other then with MW. Pt agreed to see [facility] 12/28 @ 8:45. I advised pt to go to the ED in the meantime if her levels drop in the 70's again. Pt states she will not do any exertion, until see comes in tomorrow to see MW.

## 2016-12-04 ENCOUNTER — Ambulatory Visit (INDEPENDENT_AMBULATORY_CARE_PROVIDER_SITE_OTHER): Payer: Medicare Other | Admitting: Internal Medicine

## 2016-12-04 ENCOUNTER — Encounter: Payer: Self-pay | Admitting: Internal Medicine

## 2016-12-04 ENCOUNTER — Ambulatory Visit (INDEPENDENT_AMBULATORY_CARE_PROVIDER_SITE_OTHER)
Admission: RE | Admit: 2016-12-04 | Discharge: 2016-12-04 | Disposition: A | Discharge status: Home / Self Care | Payer: Medicare Other | Source: Ambulatory Visit | Attending: Internal Medicine | Admitting: Internal Medicine

## 2016-12-04 VITALS — BP 122/58 | HR 100 | Ht 63.5 in | Wt 117.8 lb

## 2016-12-04 DIAGNOSIS — J9611 Chronic respiratory failure with hypoxia: Secondary | ICD-10-CM

## 2016-12-04 DIAGNOSIS — J449 Chronic obstructive pulmonary disease, unspecified: Secondary | ICD-10-CM

## 2016-12-04 DIAGNOSIS — J9612 Chronic respiratory failure with hypercapnia: Secondary | ICD-10-CM

## 2016-12-04 DIAGNOSIS — R05 Cough: Secondary | ICD-10-CM | POA: Diagnosis not present

## 2016-12-04 MED ORDER — PREDNISONE 10 MG PO TABS: ORAL_TABLET | ORAL | 0 refills | Status: DC

## 2016-12-04 NOTE — Progress Notes (Signed)
Subjective:    Patient ID: Carol Carson, female    DOB: 28-Aug-1941   MRN: [id number]    Brief patient profile:  35 yowf quit smoking 2000  with GOLD III  COPD documented 2014    History of Present Illness  08/10/2014 NP  Follow up  Returns for a 2 week follow up for COPD flare  Tx with doxycycline and steroid taper .  She is feeling much better . Breathing has returned to her baseline.  rec No change rx anoro     06/28/2015 f/u ov/Carol Carson re:  Copd/ anoro maint rx/ xopenex rescue  Chief Complaint  Patient presents with  . Acute Visit    started w/ a tickle in the throat, now a cough w/ a yellow mucus, gets tried easily w/ a few steps begins puffing  acutely worse since 7/17 and having to use xopenex sev times a day where at baseline not needing at all but does mostly relieve here sense of sob and chest tightness but not the tickle or coughing fits rec Continue anoro each am  Only use your xopenex as a rescue medication  Doxycycline x 7 days  Prednisone 10 mg take  4 each am x 2 days,   2 each am x 2 days,  1 each am x 2 days and stop  late add check walking sats    08/09/2015 f/u ov/Carol Carson re: copd GOLD III Chief Complaint  Patient presents with  . Follow-up    Pt states breathing is back to baseline. She states she has no new complaints at this time.   overall feels losing ground x one year and admits she's getting more and more sedendary/ has never done  Rehab  Doe MMRC 1 to 2 level Rarely using xopenex/ maint on anoro daily rec Pace yourself with exercise with goal of keeping your 02 sats at or above 90%        11/09/2015  f/u ov/Carol Carson re: copd gold III / noct and ex desats chronically on maint anoro Chief Complaint  Patient presents with  . Follow-up    Pt c/o continued dyspnea with exertion. Pt denies cough/wheeze/CP/tightness.   rarely uses xopenex Could not do [employer] s stopping  MMRC3 = can't walk 100 yards even at a slow pace at a flat grade s stopping due to sob   rec Stop anoro and try stiolto 2 puffs each am instead  Pace yourself with walks  Late add Recheck amb 02, consider refer to rehab next ov    02/15/2016  f/u ov/Carol Carson re:  Copd III/ noct 02   maint rx with anoro and prn xopenex / mouth too dry on stiolto Chief Complaint  Patient presents with  . Follow-up    Breathing is unchanged. She is not coughing or wheezing much. She uses xopenex 2-3 x per wk on average.   only use xopenox p unusual exertion, never before desired activities  Sleeping ok on 02 2lpm  rec Continue anoro each am  Only use your xopenex as a rescue medication prn Pulmonary f/u is as needed if not 100% satisfied  06/2016 stopped doing treamill exercise    09/15/2016 acute extended ov/Carol Carson re:  Copd GOLD  III/ noct 02 maint anoro/prn xopnex  Chief Complaint  Patient presents with  . Acute Visit    Feeling SOB with exertion x thurs, Having troubling catching her breath, yellow sputum, some wheezing, Pt. denies chest pain, States inhaler is working  at baseline can  do HT but not [employer] on RA while maintaining on Anoro and rarely any xoponex  09/11/16 new onset  when wakes up to go to bathroom gets sob getting back to bed assoc with cough productive of yellow mucus - has not increased xoponex. rec Doxycycline 100 mg twice daily x 10 days Prednisone 10 mg take  4 each am x 2 days,   2 each am x 2 days,  1 each am x 2 days and stop  Declined amb 02     09/29/2016  f/u ov/Carol Carson re: copd gold III/ noct 02 / anoro and rare xoponex  Chief Complaint  Patient presents with  . Follow-up    Pt states her breathing is back to her normal baseline. She has occ wheezing.    still not doing treadmill but all other activities at baseline /sleeping ok on 02  rec Try using your 02 when you do your treadmill exercise or monitor your saturations with goal of keeping it over 90%  schedule overnight 02 sat on 2lpm > desats on 2lpm so increased to 3lpm No change  In your  anoro and use   as needed xopenex Declined amb 02      10/24/2016 NP Acute OV : COPD GOLD III, on nocturnal O2.  Pt presents for an acute office visit . Says over last few months getting more sob with activity ,  Unable to less and less. For last 2 weeks Breathing has been getting worse with severe DOE. She was seen by cardiology and had cardiac stress test this morning . Stress test was low risk with nml EF . O2 sats have been running low at home.  She has had desats in past with walking into mid 80s on room air but did not want to start on o2 . Now sats are running in low 80s . On arrival today o2 sats 73% . She said they had to put her on O2 at stress test today so they could do test. She has o2 at home at home At bedtime  .  She was placed on 2 l/m of O2 with sats. >90%.  She complains of increased cough and congestion . Was treated with doxycycline and prednisone last month  rec Begin Doxcycline 100mg  Twice daily  For 7 days  Begin Prednisone taper over next week.  Check  labs today .  Begin Oxygen 2l/m during daytime  Continue oxygen 3l/m At bedtime   Mucinex DM Twice daily  As needed  Cough/congestion     11/12/2016  Extended summary f/u ov/Carol Carson re: copd III /anoro  Chief Complaint  Patient presents with  . Follow-up    Breathing has improved with supplemental o2 use.    cough resolved p above rx/ says 02 helping ex tol but not the xopenex (note that was not the purpose of the xopenex as expressed to her previously in office and in writing. / has not resumed exercise as prev rec and refuses rehab  rec Continue anoro each am  Please see patient coordinator before you leave today  to schedule ambulatory 02 evaluation to see if you are eligible for POC> "not eligible" no alternatives per pt to her tank that only lasts an hour Only use your xopenex as a rescue medication    12/04/2016 acute extended ov/Carol Carson re: GOLD III/ maint rx anoro and 02 2lpm 24/7 does not titrate Chief Complaint  Patient  presents with  . Acute Visit    Pt states  her breathing has been worse for the past 2 wks. She states her sats are low 60-70's with exertion on 2lpm.    despite flare of sob x 2 weeks sleeps ok / no am cough / congestion  Some stuffy nose ? Related to dry 02  xopenex has not been used prior to exertion, rarely tries it and doesn't think it helps though pred usually does  Concerned runs out of 02 p one hour on present system Still declines rehab "I know what I need to do"  No obvious day to day or daytime variability or assoc excess/ purulent sputum or mucus plugs or hemoptysis or cp or chest tightness, subjective wheeze or overt   hb symptoms. No unusual exp hx or h/o childhood pna/ asthma or knowledge of premature birth.  Sleeping ok without nocturnal  or early am exacerbation  of respiratory  c/o's or need for noct saba. Also denies any obvious fluctuation of symptoms with weather or environmental changes or other aggravating or alleviating factors except as outlined above   Current Medications, Allergies, Complete Past Medical History, Past Surgical History, Family History, and Social History were reviewed in [employer] record.  ROS  The following are not active complaints unless bolded sore throat, dysphagia, dental problems, itching, sneezing,  nasal congestion or excess/ purulent secretions, ear ache,   fever, chills, sweats, unintended wt loss, classically pleuritic or exertional cp,  orthopnea pnd or leg swelling, presyncope, palpitations, abdominal pain, anorexia, nausea, vomiting, diarrhea  or change in bowel or bladder habits, change in stools or urine, dysuria,hematuria,  rash, arthralgias, visual complaints, headache, numbness, weakness or ataxia or problems with walking or coordination,  change in mood/affect or memory.                Objective:   Physical Exam  Thin amb wf hopeless/helpless affect   08/09/2015  114 > 11/09/2015   117 > 02/15/2016  118 >  09/21/2016   119 > 09/29/2016   119> 11/12/2016  121 > 12/04/2016  117    Vital signs reviewed  - Note on arrival 02 sats  94% on 2lpm non pulsed    HEENT: nl   oropharynx. Nl external ear canals without cough reflex - full dentures - moderate bilateral non-specific turbinate edema  L >> R   NECK :  without JVD/Nodes/TM/ nl carotid upstrokes bilaterally   LUNGS: no acc muscle use, distant bs bilaterally without wheeze / rhonchi   CV:  RRR  no s3 or murmur or increase in P2, no edema   ABD:  soft and nontender with nl excursion in the supine position. No bruits or organomegaly, bowel sounds nl  MS:  warm without deformities, calf tenderness, cyanosis or clubbing  SKIN: warm and dry without lesions    NEURO:  alert, depressed mood/ affect , no motor  Deficits with nl gait     I personally reviewed images and agree with radiology impression as follows:  CT Sinus 12/04/2016  Paranasal sinuses clear. No bony destruction or expansion. No air-fluid levels. Ostiomeatal unit complexes patent bilaterally. No nares obstruction.

## 2016-12-04 NOTE — Patient Instructions (Addendum)
Prednisone Take 4 for two days three for two days two for two days one for two days   Please see patient coordinator before you leave today  to redo your ambulatory 02 prescription and to schedule sinus CT and I will call you with results  We will contact your oxygen company to provide you with 2lpm sitting/ 4 lpm  Just while walking and 3lpm at bedtime   I strongly suggest you reconsider going to pulmonary rehab - call if you decide to do this  Please schedule a follow up office visit in 4 weeks, sooner if needed  -= add consider change tenormin to bisoprolol

## 2016-12-04 NOTE — Progress Notes (Signed)
Spoke with pt and notified of results per Dr. Wert. Pt verbalized understanding and denied any questions. 

## 2016-12-05 NOTE — Assessment & Plan Note (Addendum)
ONO RA 01/2013:  Low sat 73%, 5 hrs spent less than or equal to 88%. - 08/09/2015  Walked RA  2 laps @ 185 ft each stopped due to desat to 86%  but no sob/ slow pace  - 02/15/2016   Walked RA  2 laps @ 185 ft each stopped due to  desat to 86% with legs giving out not sob  - ONO on 2lpm  10/03/16  desat <89% x 35min so rec 3lpm and repeated 11/03/16 desat x 57m so no change rx  - HCO3    10/24/16  = 39  - 12/04/2016   Walked 4lpm  2 laps @ 185 ft each stopped due to sob with sats 92%     rx as of 12/04/2016   4lpm with walking/ 2lpm at rest/ 3 lpm sleeping and humidity requested   This is not a new problem but apparently was not titrated for appropriate amb 02 by DME which was my request at last ov (apparently only eval for POC and turned down per pt) > will try to give specific orders this time and change companies if they are not followed.

## 2016-12-05 NOTE — Assessment & Plan Note (Addendum)
-   PFT's 01/2013:  FEV1 0.77 (35%), ratio 55, ++airtrapping, no restriction, DLCO 32% - 11/09/2015   try stiolto 2 each am instead of anoro > not able to to due to dry mouth  - 09/15/2016  After extensive coaching HFA effectiveness =    75% (short Ti) - Sinus CT 12/04/2016 Paranasal sinuses clear. No bony destruction or expansion. No air-fluid levels. Ostiomeatal unit complexes patent bilaterally. No nares obstruction.  DDX of  difficult airways management almost all start with A and  include Adherence, Ace Inhibitors, Acid Reflux, Active Sinus Disease, Alpha 1 Antitripsin deficiency, Anxiety masquerading as Airways dz,  ABPA,  Allergy(esp in young), Aspiration (esp in elderly), Adverse effects of meds,  Active smokers, A bunch of PE's (a small clot burden can't cause this syndrome unless there is already severe underlying pulm or vascular dz with poor reserve) plus two Bs  = Bronchiectasis and Beta blocker use..and one C= CHF  Adherence is always the initial "prime suspect" and is a multilayered concern that requires a "trust but verify" approach in every patient - starting with knowing how to use medications, especially inhalers, correctly, keeping up with refills and understanding the fundamental difference between maintenance and prns vs those medications only taken for a very short course and then stopped and not refilled.   ?allergy/asthma component > Prednisone 10 mg take  4 each am x 2 days,   2 each am x 2 days,  1 each am x 2 days and stop   - if does really well on pred and not so well off then good candidate for trelogy or its equivalent = breo/incruse   ? Anxiety/deptression/ deconditioning   > usually at the bottom of this list of usual suspects but should be much higher on this pt's based on H and P  .   ? Active sinus dz > excluded on today's sinus ct  ? BB effect > unlikely on low dose tenormin but if really does have steroid resp (suggesting asthmatic component) then Strongly prefer in  this setting: Bystolic, the most beta -1  selective Beta blocker available in sample form, with bisoprolol the most selective generic choice  on the market.   ? Chf> see cards w/u  Last bnp 282 in 10/24/2016  - could have diastolic dysfunction but neg IHD by myoperfusion 10/24/16    I had an extended discussion with the patient and daughter reviewing all relevant studies completed to date and  lasting 25 minutes of a 40 minute visit on the following ongoing concerns:   1) again pleaded with her to consider rehab > declined, so left the door for her to call us  2) explained again the goal is to maintain sats > 90% at all times (see chronic resp failure)   3) Each maintenance medication was reviewed in detail including most importantly the difference between maintenance and as needed and under what circumstances the prns are to be used.  Please see AVS for  customized  Instructions which were reviewed in detail in writing with the patient and a copy provided.

## 2016-12-10 ENCOUNTER — Telehealth: Payer: Self-pay | Admitting: Internal Medicine

## 2016-12-10 DIAGNOSIS — J449 Chronic obstructive pulmonary disease, unspecified: Secondary | ICD-10-CM

## 2016-12-10 NOTE — Telephone Encounter (Signed)
Spoke with pt. States that she is having issues with her oxygen. An order was sent [facility] to adjust her oxygen liter flow. Pt states that there is something else that [facility] needs from [place].  Called [facility] and spoke with Maudie Mercury. Maudie Mercury states that the pt is wanting to be tested for a POC. They need an order for this. Order has been placed.  Called pt back and made her aware of this information. She was very Patent attorney. Nothing further was needed.

## 2016-12-11 ENCOUNTER — Telehealth: Payer: Self-pay | Admitting: Adult Health

## 2016-12-12 NOTE — Telephone Encounter (Signed)
TP  Please Advise-  Spoke with Kern Reap from [facility] and she stated that she needs an amended on your office note from 10/24/16 to states that the pt. When she left the office was in a chronic stable state, so that they will not have to retest the pt.

## 2016-12-12 NOTE — Telephone Encounter (Signed)
[place] or Ask Maudie Mercury

## 2016-12-12 NOTE — Telephone Encounter (Signed)
lmtcb for Lorna at [phone].

## 2016-12-16 NOTE — Telephone Encounter (Signed)
TP please advise once pt's last ov note has been updated to reflect below information.  Thanks!

## 2016-12-18 ENCOUNTER — Telehealth: Payer: Self-pay | Admitting: Internal Medicine

## 2016-12-18 NOTE — Telephone Encounter (Signed)
Spoke with Maudie Mercury at [facility]. They are needing qualifying sats due to the pt wanting a POC. Renold Don that we need to have the pt come back as we did not test her on room air at her last OV. I called pt and she is aware that we will have to do further testing. Pt has been scheduled to come in for a qualifying walk on 12/22/16 at 11am. Nothing further was needed at this time.

## 2016-12-19 NOTE — Telephone Encounter (Signed)
TP please advise if this has been taken care of. Thanks.

## 2016-12-22 ENCOUNTER — Ambulatory Visit (INDEPENDENT_AMBULATORY_CARE_PROVIDER_SITE_OTHER): Payer: Medicare Other

## 2016-12-22 DIAGNOSIS — J9612 Chronic respiratory failure with hypercapnia: Secondary | ICD-10-CM

## 2016-12-22 DIAGNOSIS — J9611 Chronic respiratory failure with hypoxia: Secondary | ICD-10-CM

## 2016-12-23 NOTE — Telephone Encounter (Signed)
Sorry that does not make since , O2 was added and I wrote in my note that she improved with o2 and what her sat was but this was a change in her condition and thus my note reflects that  I can not go back and change my assessment .

## 2016-12-23 NOTE — Telephone Encounter (Signed)
Called [facility] and spoke with Jeani Hawking. She advised me to call Kern Reap at [phone]. Spoke with Kern Reap and made her aware of TP's respone. Kern Reap states that this is fine. Nothing further was needed.

## 2016-12-23 NOTE — Telephone Encounter (Signed)
TP - please advise. Thanks. 

## 2016-12-30 ENCOUNTER — Encounter: Payer: Self-pay | Admitting: Internal Medicine

## 2016-12-30 ENCOUNTER — Ambulatory Visit (INDEPENDENT_AMBULATORY_CARE_PROVIDER_SITE_OTHER): Payer: Medicare Other | Admitting: Internal Medicine

## 2016-12-30 VITALS — BP 108/68 | HR 93 | Ht 63.0 in | Wt 119.4 lb

## 2016-12-30 DIAGNOSIS — J449 Chronic obstructive pulmonary disease, unspecified: Secondary | ICD-10-CM | POA: Diagnosis not present

## 2016-12-30 DIAGNOSIS — J9612 Chronic respiratory failure with hypercapnia: Secondary | ICD-10-CM

## 2016-12-30 DIAGNOSIS — J9611 Chronic respiratory failure with hypoxia: Secondary | ICD-10-CM

## 2016-12-30 NOTE — Progress Notes (Signed)
Subjective:    Patient ID: Carol Carson, female    DOB: 1941/06/07   MRN: [id number]    Brief patient profile:  89 yowf quit smoking 2000  with GOLD III  COPD documented 2014    History of Present Illness  08/10/2014 NP  Follow up  Returns for a 2 week follow up for COPD flare  Tx with doxycycline and steroid taper .  She is feeling much better . Breathing has returned to her baseline.  rec No change rx anoro     06/28/2015 f/u ov/Carol Carson re:  Copd/ anoro maint rx/ xopenex rescue  Chief Complaint  Patient presents with  . Acute Visit    started w/ a tickle in the throat, now a cough w/ a yellow mucus, gets tried easily w/ a few steps begins puffing  acutely worse since 7/17 and having to use xopenex sev times a day where at baseline not needing at all but does mostly relieve here sense of sob and chest tightness but not the tickle or coughing fits rec Continue anoro each am  Only use your xopenex as a rescue medication  Doxycycline x 7 days  Prednisone 10 mg take  4 each am x 2 days,   2 each am x 2 days,  1 each am x 2 days and stop  late add check walking sats    08/09/2015 f/u ov/Carol Carson re: copd GOLD III Chief Complaint  Patient presents with  . Follow-up    Pt states breathing is back to baseline. She states she has no new complaints at this time.   overall feels losing ground x one year and admits she's getting more and more sedendary/ has never done  Rehab  Doe MMRC 1 to 2 level Rarely using xopenex/ maint on anoro daily rec Pace yourself with exercise with goal of keeping your 02 sats at or above 90%        11/09/2015  f/u ov/Carol Carson re: copd gold III / noct and ex desats chronically on maint anoro Chief Complaint  Patient presents with  . Follow-up    Pt c/o continued dyspnea with exertion. Pt denies cough/wheeze/CP/tightness.   rarely uses xopenex Could not do [employer] s stopping  MMRC3 = can't walk 100 yards even at a slow pace at a flat grade s stopping due to sob   rec Stop anoro and try stiolto 2 puffs each am instead  Pace yourself with walks  Late add Recheck amb 02, consider refer to rehab next ov    02/15/2016  f/u ov/Carol Carson re:  Copd III/ noct 02   maint rx with anoro and prn xopenex / mouth too dry on stiolto Chief Complaint  Patient presents with  . Follow-up    Breathing is unchanged. She is not coughing or wheezing much. She uses xopenex 2-3 x per wk on average.   only use xopenox p unusual exertion, never before desired activities  Sleeping ok on 02 2lpm  rec Continue anoro each am  Only use your xopenex as a rescue medication prn Pulmonary f/u is as needed if not 100% satisfied  06/2016 stopped doing treamill exercise    09/15/2016 acute extended ov/Carol Carson re:  Copd GOLD  III/ noct 02 maint anoro/prn xopnex  Chief Complaint  Patient presents with  . Acute Visit    Feeling SOB with exertion x thurs, Having troubling catching her breath, yellow sputum, some wheezing, Pt. denies chest pain, States inhaler is working  at baseline can  do HT but not [employer] on RA while maintaining on Anoro and rarely any xoponex  09/11/16 new onset  when wakes up to go to bathroom gets sob getting back to bed assoc with cough productive of yellow mucus - has not increased xoponex. rec Doxycycline 100 mg twice daily x 10 days Prednisone 10 mg take  4 each am x 2 days,   2 each am x 2 days,  1 each am x 2 days and stop  Declined amb 02     09/29/2016  f/u ov/Carol Carson re: copd gold III/ noct 02 / anoro and rare xoponex  Chief Complaint  Patient presents with  . Follow-up    Pt states her breathing is back to her normal baseline. She has occ wheezing.    still not doing treadmill but all other activities at baseline /sleeping ok on 02  rec Try using your 02 when you do your treadmill exercise or monitor your saturations with goal of keeping it over 90%  schedule overnight 02 sat on 2lpm > desats on 2lpm so increased to 3lpm No change  In your  anoro and use   as needed xopenex Declined amb 02      10/24/2016 NP Acute OV : COPD GOLD III, on nocturnal O2.  Pt presents for an acute office visit . Says over last few months getting more sob with activity ,  Unable to less and less. For last 2 weeks Breathing has been getting worse with severe DOE. She was seen by cardiology and had cardiac stress test this morning . Stress test was low risk with nml EF . O2 sats have been running low at home.  She has had desats in past with walking into mid 80s on room air but did not want to start on o2 . Now sats are running in low 80s . On arrival today o2 sats 73% . She said they had to put her on O2 at stress test today so they could do test. She has o2 at home at home At bedtime  .  She was placed on 2 l/m of O2 with sats. >90%.  She complains of increased cough and congestion . Was treated with doxycycline and prednisone last month  rec Begin Doxcycline 100mg  Twice daily  For 7 days  Begin Prednisone taper over next week.  Check  labs today .  Begin Oxygen 2l/m during daytime  Continue oxygen 3l/m At bedtime   Mucinex DM Twice daily  As needed  Cough/congestion     11/12/2016  Extended summary f/u ov/Carol Carson re: copd III /anoro  Chief Complaint  Patient presents with  . Follow-up    Breathing has improved with supplemental o2 use.    cough resolved p above rx/ says 02 helping ex tol but not the xopenex (note that was not the purpose of the xopenex as expressed to her previously in office and in writing. / has not resumed exercise as prev rec and refuses rehab  rec Continue anoro each am  Please see patient coordinator before you leave today  to schedule ambulatory 02 evaluation to see if you are eligible for POC> "not eligible" no alternatives per pt to her tank that only lasts an hour Only use your xopenex as a rescue medication    12/04/2016 acute extended ov/Carol Carson re: GOLD III/ maint rx anoro and 02 2lpm 24/7 does not titrate Chief Complaint  Patient  presents with  . Acute Visit    Pt states  her breathing has been worse for the past 2 wks. She states her sats are low 60-70's with exertion on 2lpm.    despite flare of sob x 2 weeks sleeps ok / no am cough / congestion  Some stuffy nose ? Related to dry 02  xopenex has not been used prior to exertion, rarely tries it and doesn't think it helps though pred usually does  Concerned runs out of 02 p one hour on present system Still declines rehab "I know what I need to do" rec Prednisone Take 4 for two days three for two days two for two days one for two days  Please see patient coordinator before you leave today  to redo your ambulatory 02 prescription and to schedule sinus CT and I will call you with results We will contact your oxygen company to provide you with 2lpm sitting/ 4 lpm  Just while walking and 3lpm at bedtime  I strongly suggest you reconsider going to pulmonary rehab - call if you decide to do this Please schedule a follow up office visit in 4 weeks, sooner if needed  = add consider change tenormin to bisoprolol    12/30/2016  f/u ov/Carol Carson re:  Copd III/ maint anoro  -02 2lpm, 4lpm ex/ 3 at hs  Chief Complaint  Patient presents with  . Follow-up    Breathing has improved back to her normal baseline. She has not used xopenex for rescue.   sleeps ok/ no am cough  Treadmill 8.5 min x nl pace x no elevation and maintains >89% on 02 up to 4 lpm / no need for saba   No obvious day to day or daytime variability or assoc excess/ purulent sputum or mucus plugs or hemoptysis or cp or chest tightness, subjective wheeze or overt   hb symptoms. No unusual exp hx or h/o childhood pna/ asthma or knowledge of premature birth.  Sleeping ok without nocturnal  or early am exacerbation  of respiratory  c/o's or need for noct saba. Also denies any obvious fluctuation of symptoms with weather or environmental changes or other aggravating or alleviating factors except as outlined above   Current  Medications, Allergies, Complete Past Medical History, Past Surgical History, Family History, and Social History were reviewed in [employer] record.  ROS  The following are not active complaints unless bolded sore throat, dysphagia, dental problems, itching, sneezing,  nasal congestion or excess/ purulent secretions, ear ache,   fever, chills, sweats, unintended wt loss, classically pleuritic or exertional cp,  orthopnea pnd or leg swelling, presyncope, palpitations, abdominal pain, anorexia, nausea, vomiting, diarrhea  or change in bowel or bladder habits, change in stools or urine, dysuria,hematuria,  rash, arthralgias, visual complaints, headache, numbness, weakness or ataxia or problems with walking or coordination,  change in mood/affect or memory.                Objective:   Physical Exam  Thin amb wf nad   08/09/2015  114 > 11/09/2015   117 > 02/15/2016  118 > 09/21/2016   119 > 09/29/2016   119> 11/12/2016  121 > 12/04/2016  117    Vital signs reviewed  - Note on arrival 02 sats  93% on 2lpm continuous    HEENT: nl   oropharynx. Nl external ear canals without cough reflex - full dentures - mild bilateral non-specific turbinate edema      NECK :  without JVD/Nodes/TM/ nl carotid upstrokes bilaterally   LUNGS: no  acc muscle use, distant bs bilaterally without wheeze / rhonchi or cough on insp/exp   CV:  RRR  no s3 or murmur or increase in P2, no edema   ABD:  soft and nontender with nl excursion in the supine position. No bruits or organomegaly, bowel sounds nl  MS:  warm without deformities, calf tenderness, cyanosis or clubbing  SKIN: warm and dry without lesions    NEURO:  alert, depressed mood/ affect , no motor  Deficits with nl gait     I personally reviewed images and agree with radiology impression as follows:  CT Sinus 12/04/2016  Paranasal sinuses clear. No bony destruction or expansion. No air-fluid levels. Ostiomeatal unit complexes  patent bilaterally. No nares obstruction.

## 2016-12-30 NOTE — Patient Instructions (Signed)
Pace yourself with exercise and adjust your 02 to keep it above 90%  You may want to consider using bisoprolol instead of atenolol if the anoro is not working to your satisfaction   Please schedule a follow up visit in 6  months but call sooner if needed

## 2016-12-31 ENCOUNTER — Encounter: Payer: Self-pay | Admitting: Internal Medicine

## 2016-12-31 NOTE — Assessment & Plan Note (Signed)
ONO RA 01/2013:  Low sat 73%, 5 hrs spent less than or equal to 88%. - 08/09/2015  Walked RA  2 laps @ 185 ft each stopped due to desat to 86%  but no sob/ slow pace  - 02/15/2016   Walked RA  2 laps @ 185 ft each stopped due to  desat to 86% with legs giving out not sob  - ONO on 2lpm  10/03/16  desat <89% x 109min so rec 3lpm and repeated 11/03/16 desat x 36m so no change rx  - HCO3    10/24/16  = 39  - 12/04/2016   Walked 4lpm  2 laps @ 185 ft each stopped due to sob with sats 92%    rx as of 12/30/2016   4lpm with walking/ 2lpm at rest/ 3 lpm sleeping

## 2016-12-31 NOTE — Assessment & Plan Note (Signed)
-   PFT's 01/2013:  FEV1 0.77 (35%), ratio 55, ++airtrapping, no restriction, DLCO 32% - 11/09/2015   try stiolto 2 each am instead of anoro > not able to to due to dry mouth  - 09/15/2016  After extensive coaching HFA effectiveness =    75% (short Ti) - Sinus CT 12/04/2016 Paranasal sinuses clear. No bony destruction or expansion. No air-fluid levels. Ostiomeatal unit complexes patent bilaterally. No nares obstruction.  Well compensated on anoro but not clear it's covered this year with her insurance  Formulary restrictions will be an ongoing challenge for the forseable future and I would be happy to pick an alternative if the pt will first  provide me a list of them but pt  will need to return here for training for any new device that is required eg dpi vs hfa vs respimat.    In meantime we can always provide samples so the patient never runs out of any needed respiratory medications.   Each maintenance medication was reviewed in detail including most importantly the difference between maintenance and as needed and under what circumstances the prns are to be used.  Please see AVS for specific  Instructions which are unique to this visit and I personally typed out  which were reviewed in detail in writing with the patient and a copy provided.

## 2017-01-03 ENCOUNTER — Other Ambulatory Visit: Payer: Self-pay | Admitting: Neurology

## 2017-01-06 ENCOUNTER — Other Ambulatory Visit: Payer: Self-pay | Admitting: Internal Medicine

## 2017-01-06 DIAGNOSIS — Z1231 Encounter for screening mammogram for malignant neoplasm of breast: Secondary | ICD-10-CM

## 2017-01-13 DIAGNOSIS — R809 Proteinuria, unspecified: Secondary | ICD-10-CM | POA: Diagnosis not present

## 2017-01-13 DIAGNOSIS — N2581 Secondary hyperparathyroidism of renal origin: Secondary | ICD-10-CM | POA: Diagnosis not present

## 2017-01-13 DIAGNOSIS — D631 Anemia in chronic kidney disease: Secondary | ICD-10-CM | POA: Diagnosis not present

## 2017-01-13 DIAGNOSIS — N183 Chronic kidney disease, stage 3 (moderate): Secondary | ICD-10-CM | POA: Diagnosis not present

## 2017-01-13 DIAGNOSIS — E785 Hyperlipidemia, unspecified: Secondary | ICD-10-CM | POA: Diagnosis not present

## 2017-01-22 ENCOUNTER — Ambulatory Visit (INDEPENDENT_AMBULATORY_CARE_PROVIDER_SITE_OTHER): Payer: Medicare Other | Admitting: Cardiology

## 2017-01-22 VITALS — BP 128/60 | HR 92 | Ht 63.5 in | Wt 120.8 lb

## 2017-01-22 DIAGNOSIS — I10 Essential (primary) hypertension: Secondary | ICD-10-CM

## 2017-01-22 DIAGNOSIS — I471 Supraventricular tachycardia: Secondary | ICD-10-CM | POA: Diagnosis not present

## 2017-01-22 MED ORDER — BISOPROLOL FUMARATE 5 MG PO TABS: 5.0000 mg | ORAL_TABLET | Freq: Every day | ORAL | 11 refills | Status: DC

## 2017-01-22 NOTE — Progress Notes (Signed)
Cardiology Office Note    Date:  01/22/2017   ID:  Carol Carson, DOB [birth year], MRN [id number]  PCP:  Leeroy Cha, MD  Cardiologist:  Fransico Him, MD   Chief Complaint  Patient presents with  . Follow-up    HTN and SVT    History of Present Illness:  Carol Carson is a 76 y.o. female with a history of SVT and HTN who presents today for followup.When I last saw her she was complaining of SOB and chest pain and nuclear stress test was done which showed no ischemia. She is doing well today.  She denies any CP and her SOB have resolved.  She denies any LE edema, palpitations or syncope. She saw her pulmonologist recently and he mentioned that she might need to change to a cardioselective beta blocker given her COPD.     Past Medical History:  Diagnosis Date  . Chronic renal insufficiency   . COPD (chronic obstructive pulmonary disease) ([place])   . Diverticulosis   . DJD (degenerative joint disease)   . Endometrial cancer ([place])   . Hyperlipidemia   . Hypertension   . IBS (irritable bowel syndrome)   . RLS (restless legs syndrome)   . SVT (supraventricular tachycardia) ([place])   . Uterine cancer [facility])     Past Surgical History:  Procedure Laterality Date  . ABDOMINAL HYSTERECTOMY     d/t unterine cancer///no chemo/no radiation  . LUNG SURGERY  2002   golf ball sized tumor, left lung---spot vanished     Current Medications: Current Meds  Medication Sig  . ALPRAZolam (XANAX) 0.25 MG tablet Take 0.25 mg by mouth as needed for anxiety or sleep.   Marland Kitchen amLODipine (NORVASC) 5 MG tablet Take 5 mg by mouth daily.   Marland Kitchen atenolol (TENORMIN) 25 MG tablet Take 25 mg by mouth 2 (two) times daily.  . cholecalciferol (VITAMIN D) 1000 UNITS tablet Take 1,000 Units by mouth daily.  . flecainide (TAMBOCOR) 50 MG tablet TAKE 1 TABLET(50 MG) BY MOUTH TWICE DAILY  . levalbuterol (XOPENEX HFA) 45 MCG/ACT inhaler Inhale 2 puffs into the lungs every 4 (four) hours as needed for  wheezing or shortness of breath.  . Multiple Vitamins-Minerals (CENTRUM SILVER PO) Take 1 tablet by mouth daily.  . NON FORMULARY Oxygen 2lpm with rest, 4 with exertion and 3 lpm with sleep  . rOPINIRole (REQUIP) 0.25 MG tablet Take 1-2 tablets (0.25-0.5 mg total) by mouth at bedtime.  Marland Kitchen rOPINIRole (REQUIP) 0.25 MG tablet TAKE 1 TABLET BY MOUTH 3 HOURS BEFORE BEDTIME FOR 1 WEEK, IF NO IMPROVEMENT, INCREASE TO 2 TABLETS AT BEDTIME  . simvastatin (ZOCOR) 20 MG tablet Take 20 mg by mouth every evening.  . umeclidinium-vilanterol (ANORO ELLIPTA) 62.5-25 MCG/INH AEPB Inhale 1 puff into the lungs daily.    Allergies:   Contrast media [iodinated diagnostic agents]; Biaxin [clarithromycin]; Penicillins; and Sulfa antibiotics   Social History   Social History  . Marital status: Widowed    Spouse name: N/A  . Number of children: N/A  . Years of education: N/A   Occupational History  . retired    Social History Main Topics  . Smoking status: Former Smoker    Packs/day: 1.30    Years: 43.00    Types: Cigarettes    Quit date: 12/08/1998  . Smokeless tobacco: Never Used  . Alcohol use No  . Drug use: No  . Sexual activity: Not on file   Other Topics Concern  . Not  on file   Social History Narrative   Lives alone in a one story home.  Has 1 son.     Has not worked since the 60's.     Education: high school.     Family History:  The patient's family history includes Aneurysm in her sister; Brain cancer in her father; Healthy in her son; Parkinson's disease in her brother; Stroke in her sister; Uterine cancer in her mother.   ROS:   Please see the history of present illness.    ROS All other systems reviewed and are negative.  No flowsheet data found.     PHYSICAL EXAM:   VS:  BP 128/60   Pulse 92   Ht 5' 3.5" (1.613 m)   Wt 120 lb 12.8 oz (54.8 kg)   BMI 21.06 kg/m    GEN: Well nourished, well developed, in no acute distress  HEENT: normal  Neck: no JVD, carotid bruits, or  masses Cardiac: RRR; no murmurs, rubs, or gallops,no edema.  Intact distal pulses bilaterally.  Respiratory:  clear to auscultation bilaterally, normal work of breathing GI: soft, nontender, nondistended, + BS MS: no deformity or atrophy  Skin: warm and dry, no rash Neuro:  Alert and Oriented x 3, Strength and sensation are intact Psych: euthymic mood, full affect  Wt Readings from Last 3 Encounters:  01/22/17 120 lb 12.8 oz (54.8 kg)  12/30/16 119 lb 6.4 oz (54.2 kg)  12/04/16 117 lb 12.8 oz (53.4 kg)      Studies/Labs Reviewed:   EKG:  EKG is not ordered today.    Recent Labs: 10/24/2016: BUN 12; Creatinine, Ser 1.45; Hemoglobin 13.0; Platelets 191.0; Potassium 3.9; Pro B Natriuretic peptide (BNP) 282.0; Sodium 134   Lipid Panel No results found for: CHOL, TRIG, HDL, CHOLHDL, VLDL, LDLCALC, LDLDIRECT  Additional studies/ records that were reviewed today include:  none    ASSESSMENT:    1. SVT (supraventricular tachycardia) ([place])   2. Essential hypertension      PLAN:  In order of problems listed above:  1. SVT - no reoccurrence.  She will continue on flecainide. I will change her BB to a cardioselective BB Bisoprolol.   2. HTN - BP controlled on current meds. She will continue on amlodipine and BB.    Medication Adjustments/Labs and Tests Ordered: Current medicines are reviewed at length with the patient today.  Concerns regarding medicines are outlined above.  Medication changes, Labs and Tests ordered today are listed in the Patient Instructions below.  There are no Patient Instructions on file for this visit.   Signed, Fransico Him, MD  01/22/2017 1:58 PM    [facility] [address], [place], [place]  [address] Phone: [phone]; Fax: [phone]

## 2017-01-22 NOTE — Patient Instructions (Signed)
Medication Instructions:  1) STOP ATENOLOL 2) START BISOPROLOL 5 mg daily  Labwork: None  Testing/Procedures: None  Follow-Up: Your physician wants you to follow-up in: 1 year with Dr. Radford Pax. You will receive a reminder letter in the mail two months in advance. If you don't receive a letter, please call our office to schedule the follow-up appointment.   Any Other Special Instructions Will Be Listed Below (If Applicable).     If you need a refill on your cardiac medications before your next appointment, please call your pharmacy.

## 2017-01-25 ENCOUNTER — Other Ambulatory Visit: Payer: Self-pay | Admitting: Neurology

## 2017-01-26 ENCOUNTER — Other Ambulatory Visit: Payer: Self-pay | Admitting: *Deleted

## 2017-01-26 MED ORDER — ROPINIROLE HCL 0.25 MG PO TABS: 0.2500 mg | ORAL_TABLET | Freq: Every day | ORAL | 5 refills | Status: DC

## 2017-01-26 NOTE — Telephone Encounter (Signed)
Rx sent 

## 2017-01-30 ENCOUNTER — Other Ambulatory Visit: Payer: Self-pay | Admitting: Neurology

## 2017-01-30 ENCOUNTER — Encounter: Payer: Self-pay | Admitting: Neurology

## 2017-01-30 ENCOUNTER — Ambulatory Visit (INDEPENDENT_AMBULATORY_CARE_PROVIDER_SITE_OTHER): Payer: Medicare Other | Admitting: Neurology

## 2017-01-30 VITALS — BP 100/58 | HR 98 | Ht 63.5 in | Wt 120.0 lb

## 2017-01-30 DIAGNOSIS — G2581 Restless legs syndrome: Secondary | ICD-10-CM

## 2017-01-30 MED ORDER — PRAMIPEXOLE DIHYDROCHLORIDE 0.125 MG PO TABS: 0.1250 mg | ORAL_TABLET | Freq: Three times a day (TID) | ORAL | 5 refills | Status: DC

## 2017-01-30 NOTE — Progress Notes (Signed)
Follow-up Visit   Date: 01/30/17    Carol Carson MRN: [id number] DOB: 15-Nov-1941   Interim History: Carol Carson is a 76 y.o.  right-handed Caucasian female with COPD, hyperlipidemia, SVT, h/o uterine cancer (1992), CKD, and depression returning to the clinic for follow-up of RLS.  The patient was accompanied to the clinic by self.  History of present illness: On October 20th 2016, she was reading in bed and noticed tingling sensation involving both lower extremities, which last 30-min to an hour and then resolved. It occurs daily for the first three days, but since has been improving. She endorses low back pain. No weakness. She has a history of low back pain and was evaluated by spine surgery who said that she was not a candidate for surgery. She was treated conservatively with physical therapy, epidural injections, and medication and slowly symptoms improved.   She had had restless leg syndrome since her late 74s, but it was much later in life when she was diagnosed. She feels like bugs are crawling on her legs and has the urge to move her legs. This is most bothersome at night, but over the past year it has also become bothersome during the day. She has never taken medication, but feels that symptoms are daily and is interested in getting some relief now. Sometimes, she cannot sleep for two nights because the discomfort is so severe.   UPDATE 01/31/2016:  She is doing great on ropinirole 0.25mg  1 tablet at bedtime and noticed marked improvement within the first week.  She did not increase the dose because her symptoms are well controlled.  She is able to sleep much better throughout the night.  She has occasional tingling of the leg, but his is only noticeable when she is laying on her back and occurs very infrequently.   She has occasional sensation of tremor and weakness when holding objects, but these are brief.  No tremor at rest or slowed movements.  UPDATE  01/30/2017:  She started having increased frequency of scintillating scotomas, now occurring once per week and lasting 27 minutes.  She has no headaches or other neurological symptoms.   She complains of increased daytime symptoms of crawling sensation involving the legs. She takes ropinirole 0.25mg  at bedtime which completely resolves symptoms.  She has a friend taking pramipexole 0.125mg  and would like to see if this helps, because it is very effective for her.  She no longer has paresthesias of the legs.  No interval hospitalizations or falls.   Medications:  Current Outpatient Prescriptions on File Prior to Visit  Medication Sig Dispense Refill  . ALPRAZolam (XANAX) 0.25 MG tablet Take 0.25 mg by mouth as needed for anxiety or sleep.   5  . amLODipine (NORVASC) 5 MG tablet Take 5 mg by mouth daily.   3  . bisoprolol (ZEBETA) 5 MG tablet Take 1 tablet (5 mg total) by mouth daily. 30 tablet 11  . cholecalciferol (VITAMIN D) 1000 UNITS tablet Take 1,000 Units by mouth daily.    . flecainide (TAMBOCOR) 50 MG tablet TAKE 1 TABLET(50 MG) BY MOUTH TWICE DAILY 180 tablet 0  . levalbuterol (XOPENEX HFA) 45 MCG/ACT inhaler Inhale 2 puffs into the lungs every 4 (four) hours as needed for wheezing or shortness of breath.    . Multiple Vitamins-Minerals (CENTRUM SILVER PO) Take 1 tablet by mouth daily.    . NON FORMULARY Oxygen 2lpm with rest, 4 with exertion and 3 lpm with sleep    .  simvastatin (ZOCOR) 20 MG tablet Take 20 mg by mouth every evening.    . umeclidinium-vilanterol (ANORO ELLIPTA) 62.5-25 MCG/INH AEPB Inhale 1 puff into the lungs daily. 180 each 3   No current facility-administered medications on file prior to visit.     Allergies:  Allergies  Allergen Reactions  . Contrast Media [Iodinated Diagnostic Agents]     LOW KIDNEY FUNCTION///NEED TO CHECK WITH NEPHROLOGIST BEFORE PROCEDURE.  . Biaxin [Clarithromycin] Hives  . Penicillins Hives  . Sulfa Antibiotics Rash    Review of  Systems:  CONSTITUTIONAL: No fevers, chills, night sweats, or weight loss.  EYES: No visual changes or eye pain ENT: No hearing changes.  No history of nose bleeds.   RESPIRATORY: No cough, wheezing and shortness of breath.   CARDIOVASCULAR: Negative for chest pain, and palpitations.   GI: Negative for abdominal discomfort, blood in stools or black stools.  No recent change in bowel habits.   GU:  No history of incontinence.   MUSCLOSKELETAL: No history of joint pain or swelling.  No myalgias.   SKIN: Negative for lesions, rash, and itching.   ENDOCRINE: Negative for cold or heat intolerance, polydipsia or goiter.   PSYCH:  No depression or anxiety symptoms.   NEURO: As Above.   Vital Signs:  BP (!) 100/58   Pulse 98   Ht 5' 3.5" (1.613 m)   Wt 120 lb (54.4 kg)   BMI 20.92 kg/m   Neurological Exam: MENTAL STATUS including orientation to time, place, person, recent and remote memory, attention span and concentration, language, and fund of knowledge is normal.  Speech is not dysarthric.  CRANIAL NERVES:  Pupils equal round and reactive to light.  Normal conjugate, extra-ocular eye movements in all directions of gaze.  Face is symmetric. Palate elevates symmetrically.  Tongue is midline.  MOTOR:  Motor strength is 5/5 in all extremities. Tone is normal.  Very mild intention tremor.  MSRs:  Reflexes are 2+/4 throughout.  SENSORY:  Intact to vibration throughout.  COORDINATION/GAIT:    Finger tapping intact.  Gait narrow based and stable.   Data: Labs 10/04/2015: TSH 1.40, vitamin B12 696, CBC within normal limits, Ns 138, K 4.8, Cr 1.77 GFR 28* Lab Results  Component Value Date   FERRITIN 72.4 10/10/2015    IMPRESSION/PLAN: 1. Restless leg syndrome, now with daytime symptoms. She is responding very well to ropinorole 0.25mg  at bedtime but would like to try pramipexole. My recommendation is to increase ropinorole to 0.25mg  twice daily, but since she would like to try  pramipexole, I will start her on 0.125mg  at bedtime and titrate as needed.  I suspect she will need daytime dose of dopamine agonist, whether it is ropinorole or pramipexole.  She will call with update in 3 weeks and determine further titration.   2.  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  Reassured her that she has no signs of parkinson's disease.   3.  Transient paresthesias of the lower extremities - resolved  Return to clinic in 1 year   The duration of this appointment visit was 25 minutes of face-to-face time with the patient.  Greater than 50% of this time was spent in counseling, explanation of diagnosis, planning of further management, and coordination of care.   Thank you for allowing me to participate in patient's care.  If I can answer any additional questions, I would be pleased to do so.    Sincerely,    Selenne Coggin K. Posey Pronto, DO

## 2017-01-30 NOTE — Patient Instructions (Signed)
Stop ropinorole Start pramipexole 0.125mg  3 hours before bedtime  Call with an update in 3 week  Return to clinic 1 year

## 2017-02-02 ENCOUNTER — Telehealth: Payer: Self-pay | Admitting: Neurology

## 2017-02-02 NOTE — Telephone Encounter (Signed)
Patient said that the directions for the pramipexole said to take tid but Dr. Serita Grit last note says to take it at bedtime.  Instructed her to take it qhs and to call and let us know how it is working.

## 2017-02-02 NOTE — Telephone Encounter (Signed)
PT called and said she has a question regarding her prescription/Dawn [phone]

## 2017-02-05 ENCOUNTER — Other Ambulatory Visit: Payer: Self-pay | Admitting: Cardiology

## 2017-02-06 ENCOUNTER — Telehealth: Payer: Self-pay | Admitting: Cardiology

## 2017-02-06 NOTE — Telephone Encounter (Signed)
Pt has been monitoring HR since starting Bisoprolol.  States it went up to 101-109 so she switched back to Atenolol.  Pt states she was taken off of Atenolol once before and out on another medication and ended up hospitalized d/t HR 140.  Pt switched back to Atenolol because she was concerned this would happen again.  Denies SOB and doesn't feel when HR is elevated.  Pt taking Atenolol 25 BID.  Pt prefers to stay on Atenolol if Dr. Radford Pax will permit.  Advised I will send message to Dr. Radford Pax for review and advisement.

## 2017-02-06 NOTE — Telephone Encounter (Signed)
New message    Pt c/o medication issue:  1. Name of Medication: bisoprolol 5 mg  2. How are you currently taking this medication (dosage and times per day)? 1 tab daily   3. Are you having a reaction (difficulty breathing--STAT)? Pt states med made her heart race.  4. What is your medication issue? Pt states she stopped taking this medication and started back on atenolol.

## 2017-02-08 NOTE — Telephone Encounter (Signed)
That is fine as long as her pulmonologist is ok with it

## 2017-02-09 MED ORDER — ATENOLOL 25 MG PO TABS: 25.0000 mg | ORAL_TABLET | Freq: Two times a day (BID) | ORAL | Status: DC

## 2017-02-09 NOTE — Telephone Encounter (Signed)
Fine with me just needs to be on lookout for worse sob/ cough on atenolol vs bisoprolol as they have  Very similar effects on the heart but the atenolol can affect the lungs more than bisoprolol, thus the switch that was recommended

## 2017-02-09 NOTE — Telephone Encounter (Signed)
To Dr. Melvyn Novas.

## 2017-02-09 NOTE — Telephone Encounter (Signed)
Informed patient that it is OK with both Dr. Radford Pax and Dr. Melvyn Novas to switch back to atenolol 25 mg BID. She understands to call is SOB/cough worsens. She was grateful for call.  Med list updated.

## 2017-02-11 ENCOUNTER — Telehealth: Payer: Self-pay | Admitting: *Deleted

## 2017-02-11 ENCOUNTER — Other Ambulatory Visit: Payer: Self-pay | Admitting: *Deleted

## 2017-02-11 MED ORDER — ROPINIROLE HCL 0.25 MG PO TABS: 0.2500 mg | ORAL_TABLET | Freq: Two times a day (BID) | ORAL | 5 refills | Status: DC

## 2017-02-11 NOTE — Telephone Encounter (Signed)
Patient given the new instructions and agreed with plan.  Rx sent to pharmacy.

## 2017-02-11 NOTE — Telephone Encounter (Signed)
Recommend that she start ropinorole to 0.25mg  twice daily.  She was only taking it at bedtime, but with her daytime symptoms, add extra dose to the day.  Please send rx if patient agreeable.  Carol Bullard K. Posey Pronto, DO

## 2017-02-11 NOTE — Telephone Encounter (Signed)
Patient asked to be switched from ropinerole to mirapex but she is not getting the same results.  Is it ok to switch her back to ropinerole?  [phone]

## 2017-02-17 ENCOUNTER — Ambulatory Visit: Payer: Medicare Other

## 2017-03-30 ENCOUNTER — Telehealth: Payer: Self-pay | Admitting: Internal Medicine

## 2017-03-30 MED ORDER — UMECLIDINIUM-VILANTEROL 62.5-25 MCG/INH IN AEPB: 1.0000 | INHALATION_SPRAY | Freq: Every day | RESPIRATORY_TRACT | 3 refills | Status: DC

## 2017-03-30 NOTE — Telephone Encounter (Signed)
Pt requesting 90 day refill on Anoro to [facility].  This has been sent.  Nothing further needed.

## 2017-03-31 ENCOUNTER — Ambulatory Visit: Payer: Self-pay | Admitting: Internal Medicine

## 2017-04-01 ENCOUNTER — Ambulatory Visit
Admission: RE | Admit: 2017-04-01 | Discharge: 2017-04-01 | Disposition: A | Discharge status: Home / Self Care | Payer: Medicare Other | Source: Ambulatory Visit | Attending: Internal Medicine | Admitting: Internal Medicine

## 2017-04-01 DIAGNOSIS — Z1231 Encounter for screening mammogram for malignant neoplasm of breast: Secondary | ICD-10-CM

## 2017-04-08 DIAGNOSIS — N183 Chronic kidney disease, stage 3 (moderate): Secondary | ICD-10-CM | POA: Diagnosis not present

## 2017-04-08 DIAGNOSIS — J449 Chronic obstructive pulmonary disease, unspecified: Secondary | ICD-10-CM | POA: Diagnosis not present

## 2017-04-08 DIAGNOSIS — G2581 Restless legs syndrome: Secondary | ICD-10-CM | POA: Diagnosis not present

## 2017-04-08 DIAGNOSIS — R7303 Prediabetes: Secondary | ICD-10-CM | POA: Diagnosis not present

## 2017-04-08 DIAGNOSIS — E785 Hyperlipidemia, unspecified: Secondary | ICD-10-CM | POA: Diagnosis not present

## 2017-04-08 DIAGNOSIS — I471 Supraventricular tachycardia: Secondary | ICD-10-CM | POA: Diagnosis not present

## 2017-04-08 DIAGNOSIS — F411 Generalized anxiety disorder: Secondary | ICD-10-CM | POA: Diagnosis not present

## 2017-04-14 ENCOUNTER — Other Ambulatory Visit: Payer: Self-pay | Admitting: *Deleted

## 2017-04-14 MED ORDER — ROPINIROLE HCL 0.25 MG PO TABS: 0.2500 mg | ORAL_TABLET | Freq: Two times a day (BID) | ORAL | 3 refills | Status: AC

## 2017-05-03 ENCOUNTER — Other Ambulatory Visit: Payer: Self-pay | Admitting: Cardiology

## 2017-05-05 ENCOUNTER — Telehealth: Payer: Self-pay | Admitting: Cardiology

## 2017-05-05 NOTE — Telephone Encounter (Signed)
Patient reports increased frequency and duration of irregular heart rate over the last week or so. Prior to a couple weeks ago, she states she had an episode very rarely that lasted maybe 15-20 minutes. In the last week, she has had several episodes that last about 2 hours. These episodes have been accompanied by SOB. She also states she can hear her heart pounding in her ear. She reports fluctuation HR of 70-90s on pulse-ox. She is taking her medications as directed. She is asymptomatic right now and her heart is in a regular rhythm. Scheduled patient with B. Rosita Fire, [place] tomorrow for evaluation to discuss medication changes. Patient was grateful for call and agrees with treatment plan.

## 2017-05-05 NOTE — Telephone Encounter (Signed)
°  Patient c/o Palpitations:  High priority if patient c/o lightheadedness and shortness of breath.  1. How long have you been having palpitations? 2 weeks   2. Are you currently experiencing lightheadedness and shortness of breath? Yes difficulty of breathing and a couple spells of dizziness  3. Have you checked your BP and heart rate? (document readings) hard to measure.. BP is normal   4. Are you experiencing any other symptoms? No

## 2017-05-06 ENCOUNTER — Encounter: Payer: Self-pay | Admitting: Cardiology

## 2017-05-06 ENCOUNTER — Ambulatory Visit (INDEPENDENT_AMBULATORY_CARE_PROVIDER_SITE_OTHER): Payer: Medicare Other | Admitting: Cardiology

## 2017-05-06 VITALS — BP 126/70 | HR 93 | Ht 63.5 in | Wt 115.0 lb

## 2017-05-06 DIAGNOSIS — R002 Palpitations: Secondary | ICD-10-CM | POA: Diagnosis not present

## 2017-05-06 NOTE — Patient Instructions (Signed)
Medication Instructions:   Your physician recommends that you continue on your current medications as directed. Please refer to the Current Medication list given to you today.    If you need a refill on your cardiac medications before your next appointment, please call your pharmacy.  Labwork: NONE ORDERED  TODAY    Testing/Procedures: Your physician has recommended that you wear a holter monitor. Holter monitors are medical devices that record the heart's electrical activity. Doctors most often use these monitors to diagnose arrhythmias. Arrhythmias are problems with the speed or rhythm of the heartbeat. The monitor is a small, portable device. You can wear one while you do your normal daily activities. This is usually used to diagnose what is causing palpitations/syncope (passing out).    Follow-Up: IN 2 WEEKS WITH SIMMONS   Any Other Special Instructions Will Be Listed Below (If Applicable).

## 2017-05-06 NOTE — Progress Notes (Signed)
05/06/2017 Carol Carson   [birth year]  [id number]  Primary Physician Leeroy Cha, MD Primary Cardiologist: Dr. Radford Pax   Reason for Visit/CC: Palpitations   HPI:  Carol Carson is a 76 y.o. female, followed by Dr. Radford Pax, who presents to clinic today for evaluation given recent symptoms of palpitations. She has a history of SVT and hypertension. She has undergone evaluation for shortness of breath and chest pain by nuclear stress test 10/2016 which was negative for ischemia. Her SVT has been treated with antiarrhythmic drug therapy with flecainide. She is also on beta blocker therapy with bisoprolol. She was last seen by Dr. Radford Pax 01/22/2017 and was doing well from a cardiac standpoint. She also has COPD on supplemental O2 at night and with activity. She is followed by pulmonology. She is not on albuterol. She takes Xopenex.   The patient called the office yesterday complaining of increased frequency and duration of an irregular heart rate over the last several weeks. She reports full medication compliance. She denies any excess caffeine use. No ETOH use. She also denies fever, chills, n/v/d. No chest pain. She gets mildly short of breath when palpitations occur and sometimes breaks out in a sweat. She denies syncope/ near syncope. She states she first saw her PCP when her frequency increased and basic labs were checked. Pt notes her electrolytes and thyroid function were normal. She is currently asymptomatic.   EKG shows NSR with 1st degree AVB. HR 97 bpm. QT/QTc stable at 350/444 ms.   Current Meds  Medication Sig  . ALPRAZolam (XANAX) 0.25 MG tablet Take 0.25 mg by mouth as needed for anxiety or sleep.   Marland Kitchen amLODipine (NORVASC) 5 MG tablet Take 5 mg by mouth daily.   Marland Kitchen atenolol (TENORMIN) 25 MG tablet Take 1 tablet (25 mg total) by mouth 2 (two) times daily.  . cholecalciferol (VITAMIN D) 1000 UNITS tablet Take 1,000 Units by mouth daily.  . flecainide (TAMBOCOR) 50 MG tablet  TAKE 1 TABLET(50 MG) BY MOUTH TWICE DAILY  . levalbuterol (XOPENEX HFA) 45 MCG/ACT inhaler Inhale 2 puffs into the lungs every 4 (four) hours as needed for wheezing or shortness of breath.  . Multiple Vitamins-Minerals (CENTRUM SILVER PO) Take 1 tablet by mouth daily.  . NON FORMULARY Oxygen 2lpm with rest, 4 with exertion and 3 lpm with sleep  . rOPINIRole (REQUIP) 0.25 MG tablet Take 1 tablet (0.25 mg total) by mouth 2 (two) times daily.  . simvastatin (ZOCOR) 20 MG tablet Take 20 mg by mouth every evening.  . umeclidinium-vilanterol (ANORO ELLIPTA) 62.5-25 MCG/INH AEPB Inhale 1 puff into the lungs daily.  . [DISCONTINUED] pramipexole (MIRAPEX) 0.125 MG tablet TAKE 1 TABLET(0.125 MG) BY MOUTH THREE TIMES DAILY   Allergies  Allergen Reactions  . Contrast Media [Iodinated Diagnostic Agents]     LOW KIDNEY FUNCTION///NEED TO CHECK WITH NEPHROLOGIST BEFORE PROCEDURE.  . Biaxin [Clarithromycin] Hives  . Penicillins Hives  . Sulfa Antibiotics Rash   Past Medical History:  Diagnosis Date  . Chronic renal insufficiency   . COPD (chronic obstructive pulmonary disease) ([place])   . Diverticulosis   . DJD (degenerative joint disease)   . Endometrial cancer ([place])   . Hyperlipidemia   . Hypertension   . IBS (irritable bowel syndrome)   . RLS (restless legs syndrome)   . SVT (supraventricular tachycardia) ([place])   . Uterine cancer ([place])    Family History  Problem Relation Age of Onset  . Uterine cancer Mother   .  Brain cancer Father   . Parkinson's disease Brother   . Aneurysm Sister        Brain aneusrym  . Stroke Sister   . Healthy Son   . Breast cancer Neg Hx    Past Surgical History:  Procedure Laterality Date  . ABDOMINAL HYSTERECTOMY     d/t unterine cancer///no chemo/no radiation  . LUNG SURGERY  2002   golf ball sized tumor, left lung---spot vanished    Social History   Social History  . Marital status: Widowed    Spouse name: N/A  . Number of children: N/A  . Years of  education: N/A   Occupational History  . retired    Social History Main Topics  . Smoking status: Former Smoker    Packs/day: 1.30    Years: 43.00    Types: Cigarettes    Quit date: 12/08/1998  . Smokeless tobacco: Never Used  . Alcohol use No  . Drug use: No  . Sexual activity: Not on file   Other Topics Concern  . Not on file   Social History Narrative   Lives alone in a one story home.  Has 1 son.     Has not worked since the 60's.     Education: high school.     Review of Systems: General: negative for chills, fever, night sweats or weight changes.  Cardiovascular: negative for chest pain, dyspnea on exertion, edema, orthopnea, palpitations, paroxysmal nocturnal dyspnea or shortness of breath Dermatological: negative for rash Respiratory: negative for cough or wheezing Urologic: negative for hematuria Abdominal: negative for nausea, vomiting, diarrhea, bright red blood per rectum, melena, or hematemesis Neurologic: negative for visual changes, syncope, or dizziness All other systems reviewed and are otherwise negative except as noted above.   Physical Exam:  Blood pressure 126/70, pulse 93, height 5' 3.5" (1.613 m), weight 115 lb (52.2 kg), SpO2 93 %.  General appearance: alert, cooperative and no distress Neck: no carotid bruit and no JVD Lungs: clear to auscultation bilaterally Heart: regular rate and rhythm, S1, S2 normal, no murmur, click, rub or gallop Extremities: extremities normal, atraumatic, no cyanosis or edema Pulses: 2+ and symmetric Skin: Skin color, texture, turgor normal. No rashes or lesions Neurologic: Grossly normal  EKG NSR with 1st degree AVB. HR 97 bpm. QT/QTc stable at 350/444 ms.   -- personally reviewed   ASSESSMENT AND PLAN:   1. Palpitations/History of SVT: increased frequeny in plapitations over the last several weeks. EKG currently shows NSR. Pt reports PCP recently checked electrolytes and thyroid function and were normal. We will  arrange 48 hr outpatient cardiac monitor to w/o other arrhtymias such as afib. Continue flecainide and metoprolol. Based on finding she may need further increase in flecanide. QT/QTc is stable. F/u in 1-2 weeks.   2. HTN: well controlled.   3. COPD: followed by pulmonology.    Carol Carson, MHS [facility] 05/06/2017 2:04 PM

## 2017-05-08 NOTE — Addendum Note (Signed)
Addended by: Claude Manges on: 05/08/2017 08:11 AM   Modules accepted: Orders

## 2017-05-20 ENCOUNTER — Ambulatory Visit (INDEPENDENT_AMBULATORY_CARE_PROVIDER_SITE_OTHER): Payer: Medicare Other

## 2017-05-20 DIAGNOSIS — R002 Palpitations: Secondary | ICD-10-CM | POA: Diagnosis not present

## 2017-05-26 ENCOUNTER — Encounter: Payer: Self-pay | Admitting: Cardiology

## 2017-05-26 ENCOUNTER — Ambulatory Visit (INDEPENDENT_AMBULATORY_CARE_PROVIDER_SITE_OTHER): Payer: Medicare Other | Admitting: Cardiology

## 2017-05-26 VITALS — BP 124/66 | HR 94 | Ht 64.0 in | Wt 115.0 lb

## 2017-05-26 DIAGNOSIS — R002 Palpitations: Secondary | ICD-10-CM | POA: Diagnosis not present

## 2017-05-26 NOTE — Progress Notes (Signed)
05/26/2017 Carol Carson   1941/05/04  [id number]  Primary Physician Leeroy Cha, MD Primary Cardiologist: Dr. Radford Pax    Reason for Visit/CC: f/u for palpitations   HPI:  Carol Carson is a 76 y.o. female with a history of SVT/irregular heart rate and HTN. NST 10/2016 showed no ischemia. Low risk. EF 89%. She also has COPD on supplemental O2 at night and with activity. She is followed by pulmonology. She is not on albuterol. She takes Xopenex and Anoro.   I evaluated her on 05/08/17 when she presented with complaints of recurrent palpitations, increasing in frequency and duration. She has been fully compliant with her  medications, including Flecainide and atenolol. She denies any excess caffeine use. No ETOH use. She also denies fever, chills, n/v/d. No chest pain. She gets mildly short of breath when palpitations occur and sometimes breaks out in a sweat. She denies syncope/ near syncope. She states she first saw her PCP when her frequency increased and basic labs were checked. Pt notes her electrolytes and thyroid function were normal. Based on her symptoms, I ordered a 48 hr monitor. She turned the device in yesterday. No results are in at this moment.   She is currently asymptomatic but continues to have intermittent palpitations. HR and BP are currently well controlled. She has concerns regarding possible drug interactions with her cardiac meds + inhalers for her pulmonary disorder. She has read labels stating that Anoro may worsen cardiac arrhthymias and cause tachycardia. She is scheduled to see her pulmonologist in July and plans to discuss changing medications.     Current Meds  Medication Sig  . ALPRAZolam (XANAX) 0.25 MG tablet Take 0.25 mg by mouth as needed for anxiety or sleep.   Marland Kitchen amLODipine (NORVASC) 5 MG tablet Take 5 mg by mouth daily.   Marland Kitchen atenolol (TENORMIN) 25 MG tablet Take 1 tablet (25 mg total) by mouth 2 (two) times daily.  . cholecalciferol (VITAMIN D)  1000 UNITS tablet Take 1,000 Units by mouth daily.  . flecainide (TAMBOCOR) 50 MG tablet TAKE 1 TABLET(50 MG) BY MOUTH TWICE DAILY  . levalbuterol (XOPENEX HFA) 45 MCG/ACT inhaler Inhale 2 puffs into the lungs every 4 (four) hours as needed for wheezing or shortness of breath.  . Multiple Vitamins-Minerals (CENTRUM SILVER PO) Take 1 tablet by mouth daily.  . NON FORMULARY Oxygen 2lpm with rest, 4 with exertion and 3 lpm with sleep  . rOPINIRole (REQUIP) 0.25 MG tablet Take 1 tablet (0.25 mg total) by mouth 2 (two) times daily.  . simvastatin (ZOCOR) 20 MG tablet Take 20 mg by mouth every evening.  . umeclidinium-vilanterol (ANORO ELLIPTA) 62.5-25 MCG/INH AEPB Inhale 1 puff into the lungs daily.   Allergies  Allergen Reactions  . Contrast Media [Iodinated Diagnostic Agents]     LOW KIDNEY FUNCTION///NEED TO CHECK WITH NEPHROLOGIST BEFORE PROCEDURE.  . Biaxin [Clarithromycin] Hives  . Penicillins Hives  . Sulfa Antibiotics Rash   Past Medical History:  Diagnosis Date  . Chronic renal insufficiency   . COPD (chronic obstructive pulmonary disease) ([place])   . Diverticulosis   . DJD (degenerative joint disease)   . Endometrial cancer ([place])   . Hyperlipidemia   . Hypertension   . IBS (irritable bowel syndrome)   . RLS (restless legs syndrome)   . SVT (supraventricular tachycardia) ([place])   . Uterine cancer ([place])    Family History  Problem Relation Age of Onset  . Uterine cancer Mother   . Brain cancer  Father   . Parkinson's disease Brother   . Aneurysm Sister        Brain aneusrym  . Stroke Sister   . Healthy Son   . Breast cancer Neg Hx    Past Surgical History:  Procedure Laterality Date  . ABDOMINAL HYSTERECTOMY     d/t unterine cancer///no chemo/no radiation  . LUNG SURGERY  2002   golf ball sized tumor, left lung---spot vanished    Social History   Social History  . Marital status: Widowed    Spouse name: N/A  . Number of children: N/A  . Years of education: N/A    Occupational History  . retired    Social History Main Topics  . Smoking status: Former Smoker    Packs/day: 1.30    Years: 43.00    Types: Cigarettes    Quit date: 12/08/1998  . Smokeless tobacco: Never Used  . Alcohol use No  . Drug use: No  . Sexual activity: Not on file   Other Topics Concern  . Not on file   Social History Narrative   Lives alone in a one story home.  Has 1 son.     Has not worked since the 60's.     Education: high school.     Review of Systems: General: negative for chills, fever, night sweats or weight changes.  Cardiovascular: negative for chest pain, dyspnea on exertion, edema, orthopnea, palpitations, paroxysmal nocturnal dyspnea or shortness of breath Dermatological: negative for rash Respiratory: negative for cough or wheezing Urologic: negative for hematuria Abdominal: negative for nausea, vomiting, diarrhea, bright red blood per rectum, melena, or hematemesis Neurologic: negative for visual changes, syncope, or dizziness All other systems reviewed and are otherwise negative except as noted above.   Physical Exam:  Blood pressure 124/66, pulse 94, height 5\' 4"  (1.626 m), weight 115 lb (52.2 kg).  General appearance: alert, cooperative and no distress Neck: no carotid bruit and no JVD Lungs: clear to auscultation bilaterally Heart: regular rate and rhythm, S1, S2 normal, no murmur, click, rub or gallop Extremities: extremities normal, atraumatic, no cyanosis or edema Pulses: 2+ and symmetric Skin: Skin color, texture, turgor normal. No rashes or lesions Neurologic: Grossly normal  EKG not preformed  -- personally reviewed   ASSESSMENT AND PLAN:   1. Palpitations/History of SVT: increased frequeny in plapitations over the last several weeks. EKG at last OV 2 weeks ago, when initially seen by our practice, showed NSR. Pt reports PCP recently checked electrolytes and thyroid function and were normal.  48 hr outpatient cardiac monitor was  arranged to r/o other arrhtymias such as afib. Results are pending. We will f/u on this and will provide further recommendations based on results. Continue flecainide and atenolol for now. Based on findings, she may need further increase in flecanide. She will also need to discuss with her pulmonologist the possibility of stopping  Anoro as this may potentially worsen cardiac arrhythmias. Based on findings, we may also need to refer her back to EP for consideration for ablation. We discussed the possibility of needing to go on blood thinners for a/c, in the event that PAF is detected. She has concerns regarding this given h/o recurring spontaneous nose bleeds. If PAF is detected, we will need to weigh risk vs benefit of long term oral anticoagulation. May be a candidate for [facility]' device.   2. HTN: well controlled on current regimen.   3. COPD: followed by pulmonology. She has f/u next month. She will  discuss stopping Anoro as this can worsen cardiac arrhthymias.   We will call pt with results of monitor and will provide recommendations regarding medication adjustments and f/u. She has a recall to see Dr. Radford Pax for her yearly visit early next year. She may need referral back to EP or afib clinic.   Ahnya Akre Ladoris Gene, MHS [facility] 05/26/2017 2:08 PM

## 2017-05-26 NOTE — Patient Instructions (Signed)
Medication Instructions:  None  Labwork: None  Testing/Procedures: None  Follow-Up: Keep plan to see Dr. Radford Pax in February 2019.  Any Other Special Instructions Will Be Listed Below (If Applicable).     If you need a refill on your cardiac medications before your next appointment, please call your pharmacy.

## 2017-06-04 ENCOUNTER — Telehealth: Payer: Self-pay | Admitting: Cardiology

## 2017-06-04 NOTE — Telephone Encounter (Signed)
Returned patient call-patient states she would really like to speak to [place] PA in regards to event monitor.  Has many questions in regards to results and medications.   Patient states this is not urgent but would like to speak to her when she gets a chance.   Advised I would route to [place] PA to make aware.  Patient verbalized understanding.

## 2017-06-04 NOTE — Telephone Encounter (Signed)
°  New Prob   Pt has some questions regarding her monitor. Please call.

## 2017-06-08 DIAGNOSIS — L821 Other seborrheic keratosis: Secondary | ICD-10-CM | POA: Diagnosis not present

## 2017-06-08 DIAGNOSIS — L820 Inflamed seborrheic keratosis: Secondary | ICD-10-CM | POA: Diagnosis not present

## 2017-06-09 ENCOUNTER — Telehealth: Payer: Self-pay | Admitting: Cardiology

## 2017-06-09 NOTE — Telephone Encounter (Signed)
Pt is anxious about increasing flecainide - she would like to wait and discuss with Dr. Melvyn Novas about inhalers.  She then would like to discuss with Dr. Radford Pax I will send message to [facility] and Dr. Radford Pax to have her seen earlier than Sept.  She will call if any problems prior to that time.

## 2017-06-30 ENCOUNTER — Encounter: Payer: Self-pay | Admitting: Internal Medicine

## 2017-06-30 ENCOUNTER — Ambulatory Visit (INDEPENDENT_AMBULATORY_CARE_PROVIDER_SITE_OTHER): Payer: Medicare Other | Admitting: Internal Medicine

## 2017-06-30 VITALS — BP 130/80 | HR 90 | Ht 63.5 in | Wt 115.8 lb

## 2017-06-30 DIAGNOSIS — J9611 Chronic respiratory failure with hypoxia: Secondary | ICD-10-CM

## 2017-06-30 DIAGNOSIS — J9612 Chronic respiratory failure with hypercapnia: Secondary | ICD-10-CM | POA: Diagnosis not present

## 2017-06-30 DIAGNOSIS — J449 Chronic obstructive pulmonary disease, unspecified: Secondary | ICD-10-CM

## 2017-06-30 DIAGNOSIS — I10 Essential (primary) hypertension: Secondary | ICD-10-CM

## 2017-06-30 MED ORDER — UMECLIDINIUM BROMIDE 62.5 MCG/INH IN AEPB: 1.0000 | INHALATION_SPRAY | Freq: Every day | RESPIRATORY_TRACT | 0 refills | Status: DC

## 2017-06-30 MED ORDER — UMECLIDINIUM BROMIDE 62.5 MCG/INH IN AEPB: 1.0000 | INHALATION_SPRAY | Freq: Every day | RESPIRATORY_TRACT | 11 refills | Status: DC

## 2017-06-30 NOTE — Patient Instructions (Addendum)
02 rx is 3lpm sleeping and ok to adjust during the day with goal of keeping above 90% at all times   I again strongly recommend pulmonary rehab for you   You can try off the anoro and start  incruse one click daily to see if it works as well for your breathing and whether or not it will affect your palpitations  We will need to find you another pulmonary doctor for a second opinion re your medications and management of your copd as I have done all I can at this point to help get you the care you deserve and will be available to you for pulmonary care until you've establish with a new pulmonary doctor

## 2017-06-30 NOTE — Assessment & Plan Note (Signed)
ONO RA 01/2013:  Low sat 73%, 5 hrs spent less than or equal to 88%. - 08/09/2015  Walked RA  2 laps @ 185 ft each stopped due to desat to 86%  but no sob/ slow pace  - 02/15/2016   Walked RA  2 laps @ 185 ft each stopped due to  desat to 86% with legs giving out not sob  - ONO on 2lpm  10/03/16  desat <89% x 1min so rec 3lpm and repeated 11/03/16 desat x 71m so no change rx  - HCO3    10/24/16  = 39  - 12/04/2016   Walked 4lpm  2 laps @ 185 ft each stopped due to sob with sats 92%    rx as of 06/30/2017   2lpm at rest/ 3 lpm sleeping  And adjust with walking to maintain > 90% goal at all times

## 2017-06-30 NOTE — Progress Notes (Signed)
Subjective:    Patient ID: Carol Carson, female    DOB: 12/23/1940   MRN: [id number]    Brief patient profile:  16 yowf quit smoking 2000  with GOLD III  COPD documented 2014    History of Present Illness  08/10/2014 NP  Follow up  Returns for a 2 week follow up for COPD flare  Tx with doxycycline and steroid taper .  She is feeling much better . Breathing has returned to her baseline.  rec No change rx anoro     06/28/2015 f/u ov/Carol Carson re:  Copd/ anoro maint rx/ xopenex rescue  Chief Complaint  Patient presents with  . Acute Visit    started w/ a tickle in the throat, now a cough w/ a yellow mucus, gets tried easily w/ a few steps begins puffing  acutely worse since 7/17 and having to use xopenex sev times a day where at baseline not needing at all but does mostly relieve here sense of sob and chest tightness but not the tickle or coughing fits rec Continue anoro each am  Only use your xopenex as a rescue medication  Doxycycline x 7 days  Prednisone 10 mg take  4 each am x 2 days,   2 each am x 2 days,  1 each am x 2 days and stop  late add check walking sats      08/09/2015 f/u ov/Carol Carson re: copd GOLD III Chief Complaint  Patient presents with  . Follow-up    Pt states breathing is back to baseline. She states she has no new complaints at this time.   overall feels losing ground x one year and admits she's getting more and more sedendary/ has never done  Rehab  Doe MMRC 1 to 2 level Rarely using xopenex/ maint on anoro daily rec Pace yourself with exercise with goal of keeping your 02 sats at or above 90%        11/09/2015  f/u ov/Carol Carson re: copd gold III / noct and ex desats chronically on maint anoro Chief Complaint  Patient presents with  . Follow-up    Pt c/o continued dyspnea with exertion. Pt denies cough/wheeze/CP/tightness.   rarely uses xopenex Could not do [employer] s stopping  MMRC3 = can't walk 100 yards even at a slow pace at a flat grade s stopping due to  sob  rec Stop anoro and try stiolto 2 puffs each am instead  Pace yourself with walks  Late add Recheck amb 02, consider refer to rehab next ov    02/15/2016  f/u ov/Carol Carson re:  Copd III/ noct 02   maint rx with anoro and prn xopenex / mouth too dry on stiolto Chief Complaint  Patient presents with  . Follow-up    Breathing is unchanged. She is not coughing or wheezing much. She uses xopenex 2-3 x per wk on average.   only use xopenox p unusual exertion, never before desired activities  Sleeping ok on 02 2lpm  rec Continue anoro each am  Only use your xopenex as a rescue medication prn Pulmonary f/u is as needed if not 100% satisfied  06/2016 stopped doing treamill exercise    09/15/2016 acute extended ov/Carol Carson re:  Copd GOLD  III/ noct 02 maint anoro/prn xopnex  Chief Complaint  Patient presents with  . Acute Visit    Feeling SOB with exertion x thurs, Having troubling catching her breath, yellow sputum, some wheezing, Pt. denies chest pain, States inhaler is working  at  baseline can do HT but not [employer]co on RA while maintaining on Anoro and rarely any xoponex  09/11/16 new onset  when wakes up to go to bathroom gets sob getting back to bed assoc with cough productive of yellow mucus - has not increased xoponex. rec Doxycycline 100 mg twice daily x 10 days Prednisone 10 mg take  4 each am x 2 days,   2 each am x 2 days,  1 each am x 2 days and stop  Declined amb 02     09/29/2016  f/u ov/Carol Carson re: copd gold III/ noct 02 / anoro and rare xoponex  Chief Complaint  Patient presents with  . Follow-up    Pt states her breathing is back to her normal baseline. She has occ wheezing.    still not doing treadmill but all other activities at baseline /sleeping ok on 02  rec Try using your 02 when you do your treadmill exercise or monitor your saturations with goal of keeping it over 90%  schedule overnight 02 sat on 2lpm > desats on 2lpm so increased to 3lpm No change  In your  anoro and  use  as needed xopenex Declined amb 02      10/24/2016 NP Acute OV : COPD GOLD III, on nocturnal O2.  Pt presents for an acute office visit . Says over last few months getting more sob with activity ,  Unable to less and less. For last 2 weeks Breathing has been getting worse with severe DOE. She was seen by cardiology and had cardiac stress test this morning . Stress test was low risk with nml EF . O2 sats have been running low at home.  She has had desats in past with walking into mid 80s on room air but did not want to start on o2 . Now sats are running in low 80s . On arrival today o2 sats 73% . She said they had to put her on O2 at stress test today so they could do test. She has o2 at home at home At bedtime  .  She was placed on 2 l/m of O2 with sats. >90%.  She complains of increased cough and congestion . Was treated with doxycycline and prednisone last month  rec Begin Doxcycline 100mg  Twice daily  For 7 days  Begin Prednisone taper over next week.  Check  labs today .  Begin Oxygen 2l/m during daytime  Continue oxygen 3l/m At bedtime   Mucinex DM Twice daily  As needed  Cough/congestion     11/12/2016  Extended summary f/u ov/Carol Carson re: copd III /anoro  Chief Complaint  Patient presents with  . Follow-up    Breathing has improved with supplemental o2 use.    cough resolved p above rx/ says 02 helping ex tol but not the xopenex (note that was not the purpose of the xopenex as expressed to her previously in office and in writing. / has not resumed exercise as prev rec and refuses rehab  rec Continue anoro each am  Please see patient coordinator before you leave today  to schedule ambulatory 02 evaluation to see if you are eligible for POC> "not eligible" no alternatives per pt to her tank that only lasts an hour Only use your xopenex as a rescue medication    12/04/2016 acute extended ov/Carol Carson re: GOLD III/ maint rx anoro and 02 2lpm 24/7 does not titrate Chief Complaint   Patient presents with  . Acute Visit  Pt states her breathing has been worse for the past 2 wks. She states her sats are low 60-70's with exertion on 2lpm.    despite flare of sob x 2 weeks sleeps ok / no am cough / congestion  Some stuffy nose ? Related to dry 02  xopenex has not been used prior to exertion, rarely tries it and doesn't think it helps though pred usually does  Concerned runs out of 02 p one hour on present system Still declines rehab "I know what I need to do" rec Prednisone Take 4 for two days three for two days two for two days one for two days  Please see patient coordinator before you leave today  to redo your ambulatory 02 prescription and to schedule sinus CT and I will call you with results We will contact your oxygen company to provide you with 2lpm sitting/ 4 lpm  Just while walking and 3lpm at bedtime  I strongly suggest you reconsider going to pulmonary rehab - call if you decide to do this Please schedule a follow up office visit in 4 weeks, sooner if needed  = add consider change tenormin to bisoprolol    12/30/2016  f/u ov/Markas Aldredge re:  Copd III/ maint anoro  -02 2lpm, 4lpm ex/ 3 at hs  Chief Complaint  Patient presents with  . Follow-up    Breathing has improved back to her normal baseline. She has not used xopenex for rescue.   sleeps ok/ no am cough  Treadmill 8.5 min x "nl pace" x no elevation and maintains >89% on 02 up to 4 lpm / no need for saba rec Pace yourself with exercise and adjust your 02 to keep it above 90% >  Did not do  You may want to consider using bisoprolol instead of atenolol if the anoro is not working to your satisfaction  > not clear this was done at right dosing  but when stopped atenolol palpitation worse so back on it after a few days      06/30/2017  Extended f/u ov/Sander Speckman  COPD GOLD III / anoro/ 24/7 02  Chief Complaint  Patient presents with  . Follow-up    Pt c/o increased episdoes of arrythmias x 2 months. She states  her breathing seems to be gradually worsening. She states the xopenex inhaler does not work so she never uses it.   wants try new medication but extremely vague re present level of activity tolerance on present rx  Did not adjust the 02 as recommended  "you've never explained how to manage my dz"  MW  You said you were walking at a nl pace on your treadmill at last ov, no? PT No, you misunderstood me, you have a problem with communicating - is there something else we can try that won't cause my heart to skip MW:  We can change to incruse on a trial basis - it's one of the ingredients you are already on  Pt:  Will it work and does it have any side effects  MW:  Well I'm not clairvoyant but it's the best option as the other ingredient (LABA) is more assoc with palpitations and you are already on the Incruse as it's the other ingredient in Anoro which you can go back to if Incruse isn't the answer Pt:   "was that necessary to say" you weren't clairvoyant   This conversation went steadily down as I was never able to establish a baseline and she contradicted what  she had told me at previous ov and had not followed my recs on multiple occasions before so I rec she see another pulmonary specialists to see if she'll respect their recs any better than she did mine  I told her I would stand by her until she's established with another doctor although by custom/ medical board rules I'm only required to do for 30 days.   No obvious day to day or daytime variability or assoc excess/ purulent sputum or mucus plugs or hemoptysis or cp or chest tightness, subjective wheeze or overt sinus or hb symptoms. No unusual exp hx or h/o childhood pna/ asthma or knowledge of premature birth.  Sleeping ok without nocturnal  or early am exacerbation  of respiratory  c/o's or need for noct saba. Also denies any obvious fluctuation of symptoms with weather or environmental changes or other aggravating or alleviating factors  except as outlined above   Current Medications, Allergies, Complete Past Medical History, Past Surgical History, Family History, and Social History were reviewed in [employer] record.  ROS  The following are not active complaints unless bolded sore throat, dysphagia, dental problems, itching, sneezing,  nasal congestion or excess/ purulent secretions, ear ache,   fever, chills, sweats, unintended wt loss, classically pleuritic or exertional cp,  orthopnea pnd or leg swelling, presyncope, palpitations, abdominal pain, anorexia, nausea, vomiting, diarrhea  or change in bowel or bladder habits, change in stools or urine, dysuria,hematuria,  rash, arthralgias, visual complaints, headache, numbness, weakness or ataxia or problems with walking or coordination,  change in mood/affect or memory.               Objective:   Physical Exam  Thin amb depressed/ anxious wf nad   08/09/2015  114 > 11/09/2015   117 > 02/15/2016  118 > 09/21/2016   119 > 09/29/2016   119> 11/12/2016  121 > 12/04/2016  117    Vital signs reviewed  - Note on arrival 02 sats  93% on 2lpm continuous    HEENT: nl   oropharynx. Nl external ear canals without cough reflex - full dentures - mild bilateral non-specific turbinate edema      NECK :  without JVD/Nodes/TM/ nl carotid upstrokes bilaterally   LUNGS: no acc muscle use, distant bs bilaterally without wheeze / rhonchi or cough on insp/exp   CV:  RRR  no s3 or murmur or increase in P2, no edema   ABD:  soft and nontender with nl excursion in the supine position. No bruits or organomegaly, bowel sounds nl  MS:  warm without deformities, calf tenderness, cyanosis or clubbing  SKIN: warm and dry without lesions    NEURO:  alert, depressed mood/ affect , no motor  Deficits with nl gait     I personally reviewed images and agree with radiology impression as follows:  CT Sinus 12/04/2016  Paranasal sinuses clear. No bony destruction or expansion.  No air-fluid levels. Ostiomeatal unit complexes patent bilaterally. No nares obstruction.

## 2017-06-30 NOTE — Assessment & Plan Note (Addendum)
-   PFT's 01/2013:  FEV1 0.77 (35%), ratio 55, ++airtrapping, no restriction, DLCO 32% - 11/09/2015   try stiolto 2 each am instead of anoro > not able to to due to dry mouth  - 09/15/2016  After extensive coaching HFA effectiveness =    75% (short Ti) - Sinus CT 12/04/2016 Paranasal sinuses clear. No bony destruction or expansion. No air-fluid levels. Ostiomeatal unit complexes patent bilaterally. No nares obstruction.   - 06/30/2017 trial of incruse as palpitations on Anoro and refer to alternate provider at [facility] request    See hx re conflicts and pt not following my recs   I had an extended discussion with the patient reviewing all relevant studies completed to date and  lasting 25 minutes of a 40  minute final office visit  re  severe non-specific but potentially very serious refractory respiratory symptoms of uncertain and potentially multiple  etiologies.   Each maintenance medication was reviewed in detail including most importantly the difference between maintenance and prns and under what circumstances the prns are to be triggered using an action plan format that is not reflected in the computer generated alphabetically organized AVS.    Please see AVS for specific instructions unique to this office visit that I personally wrote and verbalized to the the pt in detail and then reviewed with pt  by my nurse highlighting any changes in therapy/plan of care  recommended at today's visit.

## 2017-06-30 NOTE — Assessment & Plan Note (Signed)
Strongly prefer in this setting: Bystolic, the most beta -1  selective Beta blocker available in sample form, with bisoprolol the most selective generic choice  on the market.    I see no reason at all why bisoprolol titrated appropriately would not be just as effective at controlling bp and tendency to palpitations s risking spillover B 2 effects that occur with atenolol but defer this issue to cardiology

## 2017-07-07 ENCOUNTER — Ambulatory Visit (INDEPENDENT_AMBULATORY_CARE_PROVIDER_SITE_OTHER): Payer: Medicare Other | Admitting: Cardiology

## 2017-07-07 ENCOUNTER — Encounter: Payer: Self-pay | Admitting: Cardiology

## 2017-07-07 VITALS — BP 130/68 | HR 88 | Ht 63.5 in | Wt 117.0 lb

## 2017-07-07 DIAGNOSIS — E782 Mixed hyperlipidemia: Secondary | ICD-10-CM | POA: Diagnosis not present

## 2017-07-07 DIAGNOSIS — I10 Essential (primary) hypertension: Secondary | ICD-10-CM

## 2017-07-07 DIAGNOSIS — I471 Supraventricular tachycardia: Secondary | ICD-10-CM

## 2017-07-07 MED ORDER — DILTIAZEM HCL ER COATED BEADS 180 MG PO CP24: 180.0000 mg | ORAL_CAPSULE | Freq: Every day | ORAL | 3 refills | Status: DC

## 2017-07-07 NOTE — Patient Instructions (Signed)
Medication Instructions:  Your physician has recommended you make the following change in your medication:  1) STOP Atenolol 2) START Diltiazem 180 mg daily  If you need a refill on your cardiac medications before your next appointment, please call your pharmacy.   Labwork: None ordered  Testing/Procedures: None ordered  Follow-Up: Your physician recommends that you schedule a follow-up appointment in: 3 months with Dr. Curt Bears.  Thank you for choosing [facility]!!   Trinidad Curet, RN [phone]

## 2017-07-07 NOTE — Progress Notes (Signed)
Electrophysiology Office Note   Date:  07/07/2017   ID:  Carol, Carson 02-28-1941, MRN [id number]  PCP:  Leeroy Cha, MD  Cardiologist:  Radford Pax Primary Electrophysiologist:  Jevaeh Shams Meredith Leeds, MD    Chief Complaint  Patient presents with  . Advice Only    Palpitations/Atrial arrthymia     History of Present Illness: Carol Carson is a 76 y.o. female who is being seen today for the evaluation of palpitations at the request of Leeroy Cha,*. Presenting today for electrophysiology evaluation. She has a history of SVT/palpitations, and hypertension. She also has COPD on oxygen at night. She is currently on flecainide and atenolol. She has mild shortness of breath when her palpitations occur sometimes breaks out in a sweat. She denies syncope or near syncope. Labs have been checked with normal electrolytes and thyroid function.  Today, she denies symptoms of chest pain, orthopnea, PND, lower extremity edema, claudication, dizziness, presyncope, syncope, bleeding, or neurologic sequela. The patient is tolerating medications without difficulties. She continues to have episodes of palpitations. Her palpitations have improved. Her pulmonary medications have been adjusted, and she is planning to see another pulmonologist.   Past Medical History:  Diagnosis Date  . Chronic renal insufficiency   . COPD (chronic obstructive pulmonary disease) ([place])   . Diverticulosis   . DJD (degenerative joint disease)   . Endometrial cancer ([place])   . Hyperlipidemia   . Hypertension   . IBS (irritable bowel syndrome)   . RLS (restless legs syndrome)   . SVT (supraventricular tachycardia) ([place])   . Uterine cancer [facility])    Past Surgical History:  Procedure Laterality Date  . ABDOMINAL HYSTERECTOMY     d/t unterine cancer///no chemo/no radiation  . LUNG SURGERY  2002   golf ball sized tumor, left lung---spot vanished      Current Outpatient Prescriptions  Medication  Sig Dispense Refill  . ALPRAZolam (XANAX) 0.25 MG tablet Take 0.25 mg by mouth as needed for anxiety or sleep.   5  . amLODipine (NORVASC) 5 MG tablet Take 5 mg by mouth daily.   3  . cholecalciferol (VITAMIN D) 1000 UNITS tablet Take 1,000 Units by mouth daily.    . flecainide (TAMBOCOR) 50 MG tablet TAKE 1 TABLET(50 MG) BY MOUTH TWICE DAILY 180 tablet 2  . levalbuterol (XOPENEX HFA) 45 MCG/ACT inhaler Inhale 2 puffs into the lungs every 4 (four) hours as needed for wheezing or shortness of breath.    . Multiple Vitamins-Minerals (CENTRUM SILVER PO) Take 1 tablet by mouth daily.    . NON FORMULARY Oxygen 2lpm with rest, 4 with exertion and 3 lpm with sleep    . rOPINIRole (REQUIP) 0.25 MG tablet Take 1 tablet (0.25 mg total) by mouth 2 (two) times daily. 180 tablet 3  . simvastatin (ZOCOR) 20 MG tablet Take 20 mg by mouth every evening.    . umeclidinium bromide (INCRUSE ELLIPTA) 62.5 MCG/INH AEPB Inhale 1 puff into the lungs daily. 30 each 11  . diltiazem (CARDIZEM CD) 180 MG 24 hr capsule Take 1 capsule (180 mg total) by mouth daily. 90 capsule 3   No current facility-administered medications for this visit.     Allergies:   Contrast media [iodinated diagnostic agents]; Biaxin [clarithromycin]; Penicillins; and Sulfa antibiotics   Social History:  The patient  reports that she quit smoking about 18 years ago. Her smoking use included Cigarettes. She has a 55.90 pack-year smoking history. She has never used smokeless tobacco. She  reports that she does not drink alcohol or use drugs.   Family History:  The patient's family history includes Aneurysm in her sister; Brain cancer in her father; Healthy in her son; Parkinson's disease in her brother; Stroke in her sister; Uterine cancer in her mother.    ROS:  Please see the history of present illness.   Otherwise, review of systems is positive for none.   All other systems are reviewed and negative.    PHYSICAL EXAM: VS:  BP 130/68   Pulse  88   Ht 5' 3.5" (1.613 m)   Wt 117 lb (53.1 kg)   BMI 20.40 kg/m  , BMI Body mass index is 20.4 kg/m. GEN: Well nourished, well developed, in no acute distress  HEENT: normal  Neck: no JVD, carotid bruits, or masses Cardiac: RRR; no murmurs, rubs, or gallops,no edema  Respiratory:  clear to auscultation bilaterally, normal work of breathing GI: soft, nontender, nondistended, + BS MS: no deformity or atrophy  Skin: warm and dry Neuro:  Strength and sensation are intact Psych: euthymic mood, full affect  EKG:  EKG is not ordered today. Personal review of the ekg ordered 05/06/17 shows sinus rhythm, 1 degree AV block, rate 97  Recent Labs: 10/24/2016: BUN 12; Creatinine, Ser 1.45; Hemoglobin 13.0; Platelets 191.0; Potassium 3.9; Pro B Natriuretic peptide (BNP) 282.0; Sodium 134    Lipid Panel  No results found for: CHOL, TRIG, HDL, CHOLHDL, VLDL, LDLCALC, LDLDIRECT   Wt Readings from Last 3 Encounters:  07/07/17 117 lb (53.1 kg)  06/30/17 115 lb 12.8 oz (52.5 kg)  05/26/17 115 lb (52.2 kg)      Other studies Reviewed: Additional studies/ records that were reviewed today include: Myoview 11/17, Holter 06/03/17 personally reviewed  Review of the above records today demonstrates:   Nuclear stress EF: 89%.  There was no ST segment deviation noted during stress.  This is a low risk study.  The left ventricular ejection fraction is hyperdynamic (>65%).  The heart is very small and the LV function is hyperdynamic. There appears to be a small fixed defect in the septal basilar wall but this wall contracts vigorously. I suspect this is artifact.    Normal sinus rhythm, sinus bradydardia with average heart rate of 59bpm with heart rate ranging from 59 to 106bpm.  Wandering atrial pacemaker  Frequent PACs  Ectopic atrial rhythm vs junctional tachycardia  Occasional PVCs  ASSESSMENT AND PLAN:  1.  SVT: Currently on flecainide and atenolol. She is concerned about  increasing the flecainide as she is rather there are many side effects. She is continuing to have symptoms, and it is possible that the atenolol is causing her lung symptoms to be worse. We'll plan to stop atenolol and start her on diltiazem 180 mg. If this does not help, we Joseff Luckman either plan to increase the diltiazem or increased flecainide.  2. Hyperlipidemia: Continue Zocor  3. Hypertension: Currently well controlled. Continue amlodipine    Current medicines are reviewed at length with the patient today.   The patient does not have concerns regarding her medicines.  The following changes were made today:  Stop atenolol, start diltiazem  Labs/ tests ordered today include:  No orders of the defined types were placed in this encounter.    Disposition:   FU with Momina Hunton 3 months  Signed, Lawayne Hartig Meredith Leeds, MD  07/07/2017 11:35 AM     [facility] [address] [place] [address] [phone] (office) (  [phone] (fax)

## 2017-07-14 ENCOUNTER — Telehealth: Payer: Self-pay | Admitting: Cardiology

## 2017-07-14 NOTE — Telephone Encounter (Signed)
Pt states she had not been able to walk straight, she checked her BP last night, it was 80/40. Pt states today BP 100/61, HR 82. Pt states she did not take amlodipine this morning, she feels fine. Pt advised to continue to  hold amlodipine, continue to check BP and HR daily, continue diltiazem CD 180 mg daily.  I reviewed with pharmacist--typically not on amlodipine and diltiazem-if recommend continue amlodipine may want to change to different medication.  Pt advised I will forward to Dr Curt Bears for review.

## 2017-07-14 NOTE — Telephone Encounter (Signed)
New message    Pt c/o medication issue:  1. Name of Medication: diltiazem (CARDIZEM CD) 180 MG 24 hr capsule  2. How are you currently taking this medication (dosage and times per day)? 180mg   3. Are you having a reaction (difficulty breathing--STAT)? no  4. What is your medication issue? Pt states she has some questions about this medication. She states she is already taking amLODipine (NORVASC) 5 MG tablet which is a channel blocker and now she is taking another channel blocker. She states she read on the internet that she is not supposed to take these together.

## 2017-07-15 ENCOUNTER — Encounter: Payer: Self-pay | Admitting: Internal Medicine

## 2017-07-15 ENCOUNTER — Ambulatory Visit (INDEPENDENT_AMBULATORY_CARE_PROVIDER_SITE_OTHER): Payer: Medicare Other | Admitting: Internal Medicine

## 2017-07-15 ENCOUNTER — Other Ambulatory Visit: Payer: Medicare Other

## 2017-07-15 VITALS — BP 120/62 | HR 106 | Ht 63.5 in | Wt 115.6 lb

## 2017-07-15 DIAGNOSIS — J9611 Chronic respiratory failure with hypoxia: Secondary | ICD-10-CM | POA: Diagnosis not present

## 2017-07-15 DIAGNOSIS — J449 Chronic obstructive pulmonary disease, unspecified: Secondary | ICD-10-CM

## 2017-07-15 DIAGNOSIS — J9612 Chronic respiratory failure with hypercapnia: Secondary | ICD-10-CM | POA: Diagnosis not present

## 2017-07-15 MED ORDER — UMECLIDINIUM-VILANTEROL 62.5-25 MCG/INH IN AEPB: 1.0000 | INHALATION_SPRAY | Freq: Every day | RESPIRATORY_TRACT | 11 refills | Status: DC

## 2017-07-15 NOTE — Telephone Encounter (Signed)
Continue to hold amlodipine and check BP at home. If she becomes symptomatic again would need follow up in clinic.

## 2017-07-15 NOTE — Progress Notes (Addendum)
Subjective:     Patient ID: Carol Carson, female   DOB: 1941-10-28, 76 y.o.   MRN: [id number]  HPI   53 yowf quit smoking 2000  with GOLD III  COPD documented 2014    History of Present Illness  08/10/2014 NP  Follow up  Returns for a 2 week follow up for COPD flare  Tx with doxycycline and steroid taper .  She is feeling much better . Breathing has returned to her baseline.  rec No change rx anoro     06/28/2015 f/u ov/Wert re:  Copd/ anoro maint rx/ xopenex rescue  Chief Complaint  Patient presents with  . Acute Visit    started w/ a tickle in the throat, now a cough w/ a yellow mucus, gets tried easily w/ a few steps begins puffing  acutely worse since 7/17 and having to use xopenex sev times a day where at baseline not needing at all but does mostly relieve here sense of sob and chest tightness but not the tickle or coughing fits rec Continue anoro each am  Only use your xopenex as a rescue medication  Doxycycline x 7 days  Prednisone 10 mg take  4 each am x 2 days,   2 each am x 2 days,  1 each am x 2 days and stop  late add check walking sats      08/09/2015 f/u ov/Wert re: copd GOLD III Chief Complaint  Patient presents with  . Follow-up    Pt states breathing is back to baseline. She states she has no new complaints at this time.   overall feels losing ground x one year and admits she's getting more and more sedendary/ has never done  Rehab  Doe MMRC 1 to 2 level Rarely using xopenex/ maint on anoro daily rec Pace yourself with exercise with goal of keeping your 02 sats at or above 90%        11/09/2015  f/u ov/Wert re: copd gold III / noct and ex desats chronically on maint anoro Chief Complaint  Patient presents with  . Follow-up    Pt c/o continued dyspnea with exertion. Pt denies cough/wheeze/CP/tightness.   rarely uses xopenex Could not do [employer] s stopping  MMRC3 = can't walk 100 yards even at a slow pace at a flat grade s stopping due to sob   rec Stop anoro and try stiolto 2 puffs each am instead  Pace yourself with walks  Late add Recheck amb 02, consider refer to rehab next ov    02/15/2016  f/u ov/Wert re:  Copd III/ noct 02   maint rx with anoro and prn xopenex / mouth too dry on stiolto Chief Complaint  Patient presents with  . Follow-up    Breathing is unchanged. She is not coughing or wheezing much. She uses xopenex 2-3 x per wk on average.   only use xopenox p unusual exertion, never before desired activities  Sleeping ok on 02 2lpm  rec Continue anoro each am  Only use your xopenex as a rescue medication prn Pulmonary f/u is as needed if not 100% satisfied  06/2016 stopped doing treamill exercise    09/15/2016 acute extended ov/Wert re:  Copd GOLD  III/ noct 02 maint anoro/prn xopnex  Chief Complaint  Patient presents with  . Acute Visit    Feeling SOB with exertion x thurs, Having troubling catching her breath, yellow sputum, some wheezing, Pt. denies chest pain, States inhaler is working  at baseline  can do HT but not [employer] on RA while maintaining on Anoro and rarely any xoponex  09/11/16 new onset  when wakes up to go to bathroom gets sob getting back to bed assoc with cough productive of yellow mucus - has not increased xoponex. rec Doxycycline 100 mg twice daily x 10 days Prednisone 10 mg take  4 each am x 2 days,   2 each am x 2 days,  1 each am x 2 days and stop  Declined amb 02     09/29/2016  f/u ov/Wert re: copd gold III/ noct 02 / anoro and rare xoponex  Chief Complaint  Patient presents with  . Follow-up    Pt states her breathing is back to her normal baseline. She has occ wheezing.    still not doing treadmill but all other activities at baseline /sleeping ok on 02  rec Try using your 02 when you do your treadmill exercise or monitor your saturations with goal of keeping it over 90%  schedule overnight 02 sat on 2lpm > desats on 2lpm so increased to 3lpm No change  In your  anoro and use   as needed xopenex Declined amb 02      10/24/2016 NP Acute OV : COPD GOLD III, on nocturnal O2.  Pt presents for an acute office visit . Says over last few months getting more sob with activity ,  Unable to less and less. For last 2 weeks Breathing has been getting worse with severe DOE. She was seen by cardiology and had cardiac stress test this morning . Stress test was low risk with nml EF . O2 sats have been running low at home.  She has had desats in past with walking into mid 80s on room air but did not want to start on o2 . Now sats are running in low 80s . On arrival today o2 sats 73% . She said they had to put her on O2 at stress test today so they could do test. She has o2 at home at home At bedtime  .  She was placed on 2 l/m of O2 with sats. >90%.  She complains of increased cough and congestion . Was treated with doxycycline and prednisone last month  rec Begin Doxcycline 100mg  Twice daily  For 7 days  Begin Prednisone taper over next week.  Check  labs today .  Begin Oxygen 2l/m during daytime  Continue oxygen 3l/m At bedtime   Mucinex DM Twice daily  As needed  Cough/congestion     11/12/2016  Extended summary f/u ov/Wert re: copd III /anoro  Chief Complaint  Patient presents with  . Follow-up    Breathing has improved with supplemental o2 use.    cough resolved p above rx/ says 02 helping ex tol but not the xopenex (note that was not the purpose of the xopenex as expressed to her previously in office and in writing. / has not resumed exercise as prev rec and refuses rehab  rec Continue anoro each am  Please see patient coordinator before you leave today  to schedule ambulatory 02 evaluation to see if you are eligible for POC> "not eligible" no alternatives per pt to her tank that only lasts an hour Only use your xopenex as a rescue medication    12/04/2016 acute extended ov/Wert re: GOLD III/ maint rx anoro and 02 2lpm 24/7 does not titrate Chief Complaint  Patient  presents with  . Acute Visit    Pt  states her breathing has been worse for the past 2 wks. She states her sats are low 60-70's with exertion on 2lpm.    despite flare of sob x 2 weeks sleeps ok / no am cough / congestion  Some stuffy nose ? Related to dry 02  xopenex has not been used prior to exertion, rarely tries it and doesn't think it helps though pred usually does  Concerned runs out of 02 p one hour on present system Still declines rehab "I know what I need to do" rec Prednisone Take 4 for two days three for two days two for two days one for two days  Please see patient coordinator before you leave today  to redo your ambulatory 02 prescription and to schedule sinus CT and I will call you with results We will contact your oxygen company to provide you with 2lpm sitting/ 4 lpm  Just while walking and 3lpm at bedtime  I strongly suggest you reconsider going to pulmonary rehab - call if you decide to do this Please schedule a follow up office visit in 4 weeks, sooner if needed  = add consider change tenormin to bisoprolol    12/30/2016  f/u ov/Wert re:  Copd III/ maint anoro  -02 2lpm, 4lpm ex/ 3 at hs  Chief Complaint  Patient presents with  . Follow-up    Breathing has improved back to her normal baseline. She has not used xopenex for rescue.   sleeps ok/ no am cough  Treadmill 8.5 min x "nl pace" x no elevation and maintains >89% on 02 up to 4 lpm / no need for saba rec Pace yourself with exercise and adjust your 02 to keep it above 90% >  Did not do  You may want to consider using bisoprolol instead of atenolol if the anoro is not working to your satisfaction  > not clear this was done at right dosing  but when stopped atenolol palpitation worse so back on it after a few days      06/30/2017  Extended f/u ov/Wert  COPD GOLD III / anoro/ 24/7 02  Chief Complaint  Patient presents with  . Follow-up    Pt c/o increased episdoes of arrythmias x 2 months. She states her  breathing seems to be gradually worsening. She states the xopenex inhaler does not work so she never uses it.   wants try new medication but extremely vague re present level of activity tolerance on present rx  Did not adjust the 02 as recommended  "you've never explained how to manage my dz"  MW  You said you were walking at a nl pace on your treadmill at last ov, no? PT No, you misunderstood me, you have a problem with communicating - is there something else we can try that won't cause my heart to skip MW:  We can change to incruse on a trial basis - it's one of the ingredients you are already on  Pt:  Will it work and does it have any side effects  MW:  Well I'm not clairvoyant but it's the best option as the other ingredient (LABA) is more assoc with palpitations and you are already on the Incruse as it's the other ingredient in Anoro which you can go back to if Incruse isn't the answer Pt:   "was that necessary to say" you weren't clairvoyant   This conversation went steadily down as I was never able to establish a baseline and she contradicted what she  had told me at previous ov and had not followed my recs on multiple occasions before so I rec she see another pulmonary specialists to see if she'll respect their recs any better than she did mine  I told her I would stand by her until she's established with another doctor although by custom/ medical board rules I'm only required to do for 30 days.    OV 07/15/2017  Chief Complaint  Patient presents with  . Follow-up    Pt changing from MW to MR. Pt states her breathing is unchanged since last OV. Pt denies significant cough and CP/tightness and f/c/s.      Gold stage III COPD patient maintained on ANORO. Transfer or CARe - MW to MR   Routine COPD follow-up but I am seeing her for the first time. She tells me overall COPD stable. She is well-controlled with single agent [employer] inhaler that uses a combination of long-acting  beta agonist and inhaled corticosteroid. She does not feel the need for any step up therapy. She says with cardiac issues she does not want to do albuterol as needed. Insert she does Xopenex which although is not as effective for her. She is generally weary of taking other long-acting beta agonists due to cardiac side effects. Overall she rates her health is stable. She's not interested in pulmonary rehabilitation because she is aware of all the exercises and performs them. She learned this from a friend with COPD and went into pulmonary rehabilitation.   CAT COPD Symptom & Quality of Life Score ([employer] trademark) 0 is no burden. 5 is highest burden 07/15/2017   Never Cough -> Cough all the time 1  No phlegm in chest -> Chest is full of phlegm 2  No chest tightness -> Chest feels very tight 0  No dyspnea for 1 flight stairs/hill -> Very dyspneic for 1 flight of stairs 4  No limitations for ADL at home -> Very limited with ADL at home 3  Confident leaving home -> Not at all confident leaving home 2  Sleep soundly -> Do not sleep soundly because of lung condition 3  Lots of Energy -> No energy at all 3  TOTAL Score (max 40)  18        Last pulmonary function test February 2014 Gold stage III COPD with DLCO 39% and FEV1 postbronchodilator around the same with lowered ratio. I personally visualized the trace  CT scan of the chest last in 2007: Reports stable small nodules. Image not available for me to visualize  Chest x-ray 09/25/2016 personally visualized shows hyperinflated lung fields consistent with COPD  Review medicine cardiac stress test 10/24/2016: Is normal   has a past medical history of Chronic renal insufficiency; COPD (chronic obstructive pulmonary disease) ([place]); Diverticulosis; DJD (degenerative joint disease); Endometrial cancer ([place]); Hyperlipidemia; Hypertension; IBS (irritable bowel syndrome); RLS (restless legs syndrome); SVT (supraventricular tachycardia) ([place]); and Uterine  cancer ([place]).   reports that she quit smoking about 18 years ago. Her smoking use included Cigarettes. She has a 55.90 pack-year smoking history. She has never used smokeless tobacco.  Past Surgical History:  Procedure Laterality Date  . ABDOMINAL HYSTERECTOMY     d/t unterine cancer///no chemo/no radiation  . LUNG SURGERY  2002   golf ball sized tumor, left lung---spot vanished     Allergies  Allergen Reactions  . Contrast Media [Iodinated Diagnostic Agents]     LOW KIDNEY FUNCTION///NEED TO CHECK WITH NEPHROLOGIST BEFORE PROCEDURE.  . Biaxin [Clarithromycin]  Hives  . Penicillins Hives  . Sulfa Antibiotics Rash    Immunization History  Administered Date(s) Administered  . Influenza, High Dose Seasonal PF 10/21/2013, 10/04/2015, 09/29/2016  . Influenza,inj,Quad PF,36+ Mos 08/10/2014  . Pneumococcal Conjugate-13 09/29/2016  . Pneumococcal-Unspecified 08/19/2013    Family History  Problem Relation Age of Onset  . Uterine cancer Mother   . Brain cancer Father   . Parkinson's disease Brother   . Aneurysm Sister        Brain aneusrym  . Stroke Sister   . Healthy Son   . Breast cancer Neg Hx      Current Outpatient Prescriptions:  .  ALPRAZolam (XANAX) 0.25 MG tablet, Take 0.25 mg by mouth as needed for anxiety or sleep. , Disp: , Rfl: 5 .  cholecalciferol (VITAMIN D) 1000 UNITS tablet, Take 1,000 Units by mouth daily., Disp: , Rfl:  .  diltiazem (CARDIZEM CD) 180 MG 24 hr capsule, Take 1 capsule (180 mg total) by mouth daily., Disp: 90 capsule, Rfl: 3 .  flecainide (TAMBOCOR) 50 MG tablet, TAKE 1 TABLET(50 MG) BY MOUTH TWICE DAILY, Disp: 180 tablet, Rfl: 2 .  levalbuterol (XOPENEX HFA) 45 MCG/ACT inhaler, Inhale 2 puffs into the lungs every 4 (four) hours as needed for wheezing or shortness of breath., Disp: , Rfl:  .  Multiple Vitamins-Minerals (CENTRUM SILVER PO), Take 1 tablet by mouth daily., Disp: , Rfl:  .  NON FORMULARY, Oxygen 2lpm with rest, 4 with exertion and 3  lpm with sleep, Disp: , Rfl:  .  rOPINIRole (REQUIP) 0.25 MG tablet, Take 1 tablet (0.25 mg total) by mouth 2 (two) times daily., Disp: 180 tablet, Rfl: 3 .  simvastatin (ZOCOR) 20 MG tablet, Take 20 mg by mouth every evening., Disp: , Rfl:  .  umeclidinium-vilanterol (ANORO ELLIPTA) 62.5-25 MCG/INH AEPB, Inhale 1 puff into the lungs daily., Disp: , Rfl:     has a past medical history of Chronic renal insufficiency; COPD (chronic obstructive pulmonary disease) ([place]); Diverticulosis; DJD (degenerative joint disease); Endometrial cancer ([place]); Hyperlipidemia; Hypertension; IBS (irritable bowel syndrome); RLS (restless legs syndrome); SVT (supraventricular tachycardia) ([place]); and Uterine cancer ([place]).   reports that she quit smoking about 18 years ago. Her smoking use included Cigarettes. She has a 55.90 pack-year smoking history. She has never used smokeless tobacco.  Past Surgical History:  Procedure Laterality Date  . ABDOMINAL HYSTERECTOMY     d/t unterine cancer///no chemo/no radiation  . LUNG SURGERY  2002   golf ball sized tumor, left lung---spot vanished     Allergies  Allergen Reactions  . Contrast Media [Iodinated Diagnostic Agents]     LOW KIDNEY FUNCTION///NEED TO CHECK WITH NEPHROLOGIST BEFORE PROCEDURE.  . Biaxin [Clarithromycin] Hives  . Penicillins Hives  . Sulfa Antibiotics Rash    Immunization History  Administered Date(s) Administered  . Influenza, High Dose Seasonal PF 10/21/2013, 10/04/2015, 09/29/2016  . Influenza,inj,Quad PF,36+ Mos 08/10/2014  . Pneumococcal Conjugate-13 09/29/2016  . Pneumococcal-Unspecified 08/19/2013    Family History  Problem Relation Age of Onset  . Uterine cancer Mother   . Brain cancer Father   . Parkinson's disease Brother   . Aneurysm Sister        Brain aneusrym  . Stroke Sister   . Healthy Son   . Breast cancer Neg Hx      Current Outpatient Prescriptions:  .  ALPRAZolam (XANAX) 0.25 MG tablet, Take 0.25 mg by mouth as  needed for anxiety or sleep. , Disp: , Rfl:  5 .  cholecalciferol (VITAMIN D) 1000 UNITS tablet, Take 1,000 Units by mouth daily., Disp: , Rfl:  .  diltiazem (CARDIZEM CD) 180 MG 24 hr capsule, Take 1 capsule (180 mg total) by mouth daily., Disp: 90 capsule, Rfl: 3 .  flecainide (TAMBOCOR) 50 MG tablet, TAKE 1 TABLET(50 MG) BY MOUTH TWICE DAILY, Disp: 180 tablet, Rfl: 2 .  levalbuterol (XOPENEX HFA) 45 MCG/ACT inhaler, Inhale 2 puffs into the lungs every 4 (four) hours as needed for wheezing or shortness of breath., Disp: , Rfl:  .  Multiple Vitamins-Minerals (CENTRUM SILVER PO), Take 1 tablet by mouth daily., Disp: , Rfl:  .  NON FORMULARY, Oxygen 2lpm with rest, 4 with exertion and 3 lpm with sleep, Disp: , Rfl:  .  rOPINIRole (REQUIP) 0.25 MG tablet, Take 1 tablet (0.25 mg total) by mouth 2 (two) times daily., Disp: 180 tablet, Rfl: 3 .  simvastatin (ZOCOR) 20 MG tablet, Take 20 mg by mouth every evening., Disp: , Rfl:  .  umeclidinium-vilanterol (ANORO ELLIPTA) 62.5-25 MCG/INH AEPB, Inhale 1 puff into the lungs daily., Disp: , Rfl:    Review of Systems     Objective:   Physical Exam  Constitutional: She is oriented to person, place, and time. She appears well-developed and well-nourished. No distress.  HENT:  Head: Normocephalic and atraumatic.  Right Ear: External ear normal.  Left Ear: External ear normal.  Mouth/Throat: Oropharynx is clear and moist. No oropharyngeal exudate.  o2 on  Eyes: Pupils are equal, round, and reactive to light. Conjunctivae and EOM are normal. Right eye exhibits no discharge. Left eye exhibits no discharge. No scleral icterus.  Neck: Normal range of motion. Neck supple. No JVD present. No tracheal deviation present. No thyromegaly present.  Cardiovascular: Normal rate, regular rhythm, normal heart sounds and intact distal pulses.  Exam reveals no gallop and no friction rub.   No murmur heard. Pulmonary/Chest: Effort normal and breath sounds normal. No  respiratory distress. She has no wheezes. She has no rales. She exhibits no tenderness.  Abdominal: Soft. Bowel sounds are normal. She exhibits no distension and no mass. There is no tenderness. There is no rebound and no guarding.  Musculoskeletal: Normal range of motion. She exhibits no edema or tenderness.  Lymphadenopathy:    She has no cervical adenopathy.  Neurological: She is alert and oriented to person, place, and time. She has normal reflexes. No cranial nerve deficit. She exhibits normal muscle tone. Coordination normal.  Skin: Skin is warm and dry. No rash noted. She is not diaphoretic. No erythema. No pallor.  Psychiatric: She has a normal mood and affect. Her behavior is normal. Judgment and thought content normal.  Vitals reviewed.  Vitals:   07/15/17 1146  BP: 120/62  Pulse: (!) 106  SpO2: 93%  Weight: 115 lb 9.6 oz (52.4 kg)  Height: 5' 3.5" (1.613 m)    Estimated body mass index is 20.16 kg/m as calculated from the following:   Height as of this encounter: 5' 3.5" (1.613 m).   Weight as of this encounter: 115 lb 9.6 oz (52.4 kg).     Assessment:       ICD-10-CM   1. COPD GOLD III/ emphysematous predominantly  J44.9   2. 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 No lung volume - return to see DR Chase Caller 3-4 months but after pft  > 50% of this > 25 min visit spent in face to face counseling or coordination of care   Dr. Brand Males, M.D., [facility].C.P Pulmonary and Critical Care  Medicine Staff Physician [facility] Pager: [phone], If no answer or between  15:00h - 7:00h: call [phone]  07/15/2017 12:38 PM

## 2017-07-15 NOTE — Patient Instructions (Addendum)
ICD-10-CM   1. COPD GOLD III/ emphysematous predominantly  J44.9   2. 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 No lung volume - return to see DR Chase Caller 3-4 months but after pft

## 2017-07-15 NOTE — Addendum Note (Signed)
Addended by: Collier Salina on: 07/15/2017 12:43 PM   Modules accepted: Orders

## 2017-07-16 MED ORDER — DILTIAZEM HCL 60 MG PO TABS: 60.0000 mg | ORAL_TABLET | Freq: Every day | ORAL | 11 refills | Status: DC

## 2017-07-16 NOTE — Telephone Encounter (Signed)
Can increase diltiazem to 240 mg for improved HR control.

## 2017-07-16 NOTE — Telephone Encounter (Signed)
Called patient to review Dr. Macky Lower advice with her and she states her heart rate is 112-120 bpm today. She states she is taking her diltiazem and flecainide. States BP is better today at 100/59 mmHg and she denies symptoms of light-headedness or dizziness. She states BP was 110/70 mmHg (normal for her) yesterday at pulmonologist office. We discussed her history of SVT and she will continue to monitor her HR. She states she is staying well hydrated and denies consumption of caffeine. She states she feels like she can continue her regular daily activities and will continue to monitor. She is aware to call back if HR >120 bpm for extended period of time. She thanked me for the call. I am routing to Dr. Curt Bears for awareness and to see if he wants to increase dose of diltiazem as indicated at last ov.

## 2017-07-16 NOTE — Telephone Encounter (Signed)
Please find out what dose of simvastatin she was on and I will change to Lipitor which does not interact with Cardizem

## 2017-07-16 NOTE — Telephone Encounter (Signed)
Called patient to discuss Dr. Macky Lower advice. She states her HR today has been 109-128 bpm. I advised her to increase diltiazem to 240 mg daily. She states she just got a 90 day supply of the diltiazem 180 mg capsules. I advised that I will send diltiazem 60 mg capsules for her to take in addition to her 180 mg. She requests a 30 day supply and she will call back at the end of that time to report how she is doing. Patient asked me about the effects of Simvastatin with Diltiazem and states she does not want to take the Simvastatin any longer because of the materials that she has read. I advised her to stop it and I will route a message to Dr. Radford Pax for advice on her cholesterol. She thanked me for the call.

## 2017-07-17 ENCOUNTER — Telehealth: Payer: Self-pay | Admitting: Internal Medicine

## 2017-07-17 MED ORDER — UMECLIDINIUM-VILANTEROL 62.5-25 MCG/INH IN AEPB: 1.0000 | INHALATION_SPRAY | Freq: Every day | RESPIRATORY_TRACT | 3 refills | Status: AC

## 2017-07-17 MED ORDER — ATORVASTATIN CALCIUM 10 MG PO TABS: 10.0000 mg | ORAL_TABLET | Freq: Every day | ORAL | 3 refills | Status: DC

## 2017-07-17 NOTE — Telephone Encounter (Signed)
Equivalent dose of Lipitor would be 10mg  daily.

## 2017-07-17 NOTE — Telephone Encounter (Signed)
Instructed patient to START LIPITOR 10 mg daily. She understands to come to OV with Dr. Radford Pax on 9/25 fasting to recheck cholesterol. She was grateful for call and agrees with treatment plan.

## 2017-07-17 NOTE — Telephone Encounter (Signed)
Patient was taking Zocor 20 mg daily.   To [facility] for recommendations.

## 2017-07-17 NOTE — Telephone Encounter (Signed)
Called and spoke with pt and she stated that she will need the anoro sent to the mail order pharmacy since she can get this for a cheaper price through them.  This has been done and nothing further is needed.

## 2017-07-21 ENCOUNTER — Telehealth: Payer: Self-pay | Admitting: Cardiology

## 2017-07-21 LAB — ALPHA-1 ANTITRYPSIN PHENOTYPE: A-1 Antitrypsin: 174 mg/dL (ref 83–199)

## 2017-07-21 MED ORDER — AMLODIPINE BESYLATE 5 MG PO TABS: 5.0000 mg | ORAL_TABLET | Freq: Every day | ORAL | 3 refills | Status: DC

## 2017-07-21 MED ORDER — ATENOLOL 25 MG PO TABS: 25.0000 mg | ORAL_TABLET | Freq: Two times a day (BID) | ORAL | 2 refills | Status: DC

## 2017-07-21 NOTE — Telephone Encounter (Signed)
Pt reports increased HRs, "I can feel my heart pounding in my ears".   States resting HR 115-124, activity HR 130-140s. She says that she is getting tired more easily and can't do her housework.  This has been occurring for the last week.  Reports that it "calms down if she sits quietly".  She didn't go to church Sunday due to fatigue and increased HRs. She is currently taking Diltiazem 240 mg and Flecainide 50 mg BID. She tells me that she is ready to increase Flecainide that has been previously recommended but she was hesitant. She understands I will review with MD and call her back with recommendations.

## 2017-07-21 NOTE — Telephone Encounter (Signed)
Reviewed with DOD, Dr. Acie Fredrickson and pharmacist, Claiborne Billings. After discussion with patient/MD/pharmacist about Ellipta inhaler and BB therapy, decision made to stop Diltiazem and restart Atenolol 25 mg BID and Norvasc 5 mg daily. (this regimen seemed to work better for patient)  (pt denies having issues on Atenolol, such a SOB) Pt has already taken her Diltiazem today, so she will start above recommendations tomorrow.   She will call the office back if recommendations don't make improvement in symptoms

## 2017-07-21 NOTE — Telephone Encounter (Signed)
New message    Pt c/o medication issue:  1. Name of Medication: diltiazem (CARDIZEM CD) 180 MG 24 hr capsule  2. How are you currently taking this medication (dosage and times per day)? 180mg   3. Are you having a reaction (difficulty breathing--STAT)? bp running high  4. What is your medication issue? Pt states that this is not working and now she is having extra problems - hears heart pound in her head

## 2017-07-27 DIAGNOSIS — S322XXA Fracture of coccyx, initial encounter for closed fracture: Secondary | ICD-10-CM | POA: Diagnosis not present

## 2017-07-28 ENCOUNTER — Telehealth: Payer: Self-pay | Admitting: Cardiology

## 2017-07-28 NOTE — Telephone Encounter (Signed)
New Message     She would like to switch to the alternative medication you spoke about.

## 2017-07-28 NOTE — Telephone Encounter (Signed)
Pt understands recommendation from Dr. Curt Bears is to increase Flecainide to 100 mg BID. She understands I will review if EKG/stress testing is needed for increased dosage, and I will call her tomorrow to advise what testing needs to be done and when to start the Flecainide increased. She is agreeable to plan

## 2017-07-29 MED ORDER — FLECAINIDE ACETATE 50 MG PO TABS: 50.0000 mg | ORAL_TABLET | Freq: Two times a day (BID) | ORAL | 3 refills | Status: AC

## 2017-07-29 NOTE — Telephone Encounter (Signed)
Advised that Dr. Curt Bears is ok with patient having EKG after increasing Flecainide to 100 mg BID. She tells me that she currently has a broken tail bone and is unable to travel.   She is going to call me in several weeks for [place] to schedule EKG and increase medication. In the meantime, she will remain on 50 mg BID. She thanks me for calling.

## 2017-07-30 NOTE — Progress Notes (Signed)
Spoke with pt and notified of results per Dr. Ramaswamy. Pt verbalized understanding and denied any questions.  

## 2017-08-21 ENCOUNTER — Encounter: Payer: Self-pay | Admitting: Cardiology

## 2017-09-01 ENCOUNTER — Ambulatory Visit (INDEPENDENT_AMBULATORY_CARE_PROVIDER_SITE_OTHER): Payer: Medicare Other | Admitting: Cardiology

## 2017-09-01 VITALS — BP 132/68 | HR 92 | Ht 63.5 in | Wt 115.8 lb

## 2017-09-01 DIAGNOSIS — I10 Essential (primary) hypertension: Secondary | ICD-10-CM

## 2017-09-01 DIAGNOSIS — I471 Supraventricular tachycardia: Secondary | ICD-10-CM | POA: Diagnosis not present

## 2017-09-01 MED ORDER — AMLODIPINE BESYLATE 5 MG PO TABS
5.0000 mg | ORAL_TABLET | Freq: Every day | ORAL | 3 refills | Status: DC
Start: 2017-09-01 — End: 2017-10-20

## 2017-09-01 NOTE — Progress Notes (Signed)
Cardiology Office Note:    Date:  09/01/2017   ID:  Carol Carson, DOB 1941/06/19, MRN [id number]  PCP:  Leeroy Cha, MD  Cardiologist:  Fransico Him, MD   Referring MD: Leeroy Cha,*   Chief Complaint  Patient presents with  . Follow-up    SVT and HTN    History of Present Illness:    Carol Carson is a 76 y.o. female with a hx of SVT and HTN.  She also has a history of remote SOB and CP and nuclear stress test showed no ischemia.  She has been on Flecainide for her SVT as well as Cardizem and is followed by EP.  She did not tolerate the cardizem due to severe hypotension and went back to atenolol.  She has chronic COPD on supplemental O2 at night and with activity and is follow by Pulmonary.   She is here today for followup and is doing well.  She denies any chest pain or pressure, SOB, DOE, PND, orthopnea, LE edema, dizziness, palpitations or syncope.     Past Medical History:  Diagnosis Date  . Chronic renal insufficiency   . COPD (chronic obstructive pulmonary disease) ([place])   . Diverticulosis   . DJD (degenerative joint disease)   . Endometrial cancer ([place])   . Hyperlipidemia   . Hypertension   . IBS (irritable bowel syndrome)   . RLS (restless legs syndrome)   . SVT (supraventricular tachycardia) ([place])   . Uterine cancer [facility])     Past Surgical History:  Procedure Laterality Date  . ABDOMINAL HYSTERECTOMY     d/t unterine cancer///no chemo/no radiation  . LUNG SURGERY  2002   golf ball sized tumor, left lung---spot vanished     Current Medications: Current Meds  Medication Sig  . ALPRAZolam (XANAX) 0.25 MG tablet Take 0.25 mg by mouth as needed for anxiety or sleep.   Marland Kitchen amLODipine (NORVASC) 5 MG tablet Take 1 tablet (5 mg total) by mouth daily.  Marland Kitchen atenolol (TENORMIN) 25 MG tablet Take 1 tablet (25 mg total) by mouth 2 (two) times daily.  . cholecalciferol (VITAMIN D) 1000 UNITS tablet Take 1,000 Units by mouth daily.  . flecainide  (TAMBOCOR) 50 MG tablet Take 1 tablet (50 mg total) by mouth 2 (two) times daily.  Marland Kitchen levalbuterol (XOPENEX HFA) 45 MCG/ACT inhaler Inhale 2 puffs into the lungs every 4 (four) hours as needed for wheezing or shortness of breath.  . Multiple Vitamins-Minerals (CENTRUM SILVER PO) Take 1 tablet by mouth daily.  . NON FORMULARY Oxygen 2lpm with rest, 4 with exertion and 3 lpm with sleep  . rOPINIRole (REQUIP) 0.25 MG tablet Take 1 tablet (0.25 mg total) by mouth 2 (two) times daily.  . simvastatin (ZOCOR) 20 MG tablet TK 1 T PO  QPM.  . umeclidinium-vilanterol (ANORO ELLIPTA) 62.5-25 MCG/INH AEPB Inhale 1 puff into the lungs daily.     Allergies:   Contrast media [iodinated diagnostic agents]; Biaxin [clarithromycin]; Penicillins; and Sulfa antibiotics   Social History   Social History  . Marital status: Widowed    Spouse name: N/A  . Number of children: N/A  . Years of education: N/A   Occupational History  . retired    Social History Main Topics  . Smoking status: Former Smoker    Packs/day: 1.30    Years: 43.00    Types: Cigarettes    Quit date: 12/08/1998  . Smokeless tobacco: Never Used  . Alcohol use No  . Drug  use: No  . Sexual activity: Not on file   Other Topics Concern  . Not on file   Social History Narrative   Lives alone in a one story home.  Has 1 son.     Has not worked since the 60's.     Education: high school.     Family History: The patient's family history includes Aneurysm in her sister; Brain cancer in her father; Healthy in her son; Parkinson's disease in her brother; Stroke in her sister; Uterine cancer in her mother. There is no history of Breast cancer.  ROS:   Please see the history of present illness.    ROS  All other systems reviewed and negative.   EKGs/Labs/Other Studies Reviewed:    The following studies were reviewed today: Notes from EP  EKG:  EKG is not ordered today.    Recent Labs: 10/24/2016: BUN 12; Creatinine, Ser 1.45;  Hemoglobin 13.0; Platelets 191.0; Potassium 3.9; Pro B Natriuretic peptide (BNP) 282.0; Sodium 134   Recent Lipid Panel No results found for: CHOL, TRIG, HDL, CHOLHDL, VLDL, LDLCALC, LDLDIRECT  Physical Exam:    VS:  BP 132/68   Pulse 92   Ht 5' 3.5" (1.613 m)   Wt 115 lb 12.8 oz (52.5 kg)   SpO2 96%   BMI 20.19 kg/m     Wt Readings from Last 3 Encounters:  09/01/17 115 lb 12.8 oz (52.5 kg)  07/15/17 115 lb 9.6 oz (52.4 kg)  07/07/17 117 lb (53.1 kg)     GEN:  Well nourished, well developed in no acute distress HEENT: Normal NECK: No JVD; No carotid bruits LYMPHATICS: No lymphadenopathy CARDIAC: RRR, no murmurs, rubs, gallops RESPIRATORY:  Clear to auscultation without rales, wheezing or rhonchi  ABDOMEN: Soft, non-tender, non-distended MUSCULOSKELETAL:  No edema; No deformity  SKIN: Warm and dry NEUROLOGIC:  Alert and oriented x 3 PSYCHIATRIC:  Normal affect   ASSESSMENT:    1. SVT (supraventricular tachycardia) ([place])   2. Essential hypertension    PLAN:    In order of problems listed above:  1.  SVT - her palpitations seem to be controlled now on Flecainide and Atenolol.   2.  HTN - her BP is well controlled on exam today.  She will continue on amlodipine 5mg  daily and Atenolol 25mg  daily.     Medication Adjustments/Labs and Tests Ordered: Current medicines are reviewed at length with the patient today.  Concerns regarding medicines are outlined above.  No orders of the defined types were placed in this encounter.  No orders of the defined types were placed in this encounter.   Signed, Fransico Him, MD  09/01/2017 8:13 AM    [place]

## 2017-09-01 NOTE — Patient Instructions (Signed)
Medication Instructions:  Your provider recommends that you continue on your current medications as directed. Please refer to the Current Medication list given to you today.    Labwork: None  Testing/Procedures: None  Follow-Up: Your provider wants you to follow-up in: 6 months with Dr. Turner. You will receive a reminder letter in the mail two months in advance. If you don't receive a letter, please call our office to schedule the follow-up appointment.    Any Other Special Instructions Will Be Listed Below (If Applicable).     If you need a refill on your cardiac medications before your next appointment, please call your pharmacy.   

## 2017-09-07 DIAGNOSIS — Z0000 Encounter for general adult medical examination without abnormal findings: Secondary | ICD-10-CM | POA: Diagnosis not present

## 2017-09-07 DIAGNOSIS — M85859 Other specified disorders of bone density and structure, unspecified thigh: Secondary | ICD-10-CM | POA: Diagnosis not present

## 2017-09-07 DIAGNOSIS — F410 Panic disorder [episodic paroxysmal anxiety] without agoraphobia: Secondary | ICD-10-CM | POA: Diagnosis not present

## 2017-09-07 DIAGNOSIS — G4700 Insomnia, unspecified: Secondary | ICD-10-CM | POA: Diagnosis not present

## 2017-09-07 DIAGNOSIS — Z23 Encounter for immunization: Secondary | ICD-10-CM | POA: Diagnosis not present

## 2017-09-07 DIAGNOSIS — I10 Essential (primary) hypertension: Secondary | ICD-10-CM | POA: Diagnosis not present

## 2017-09-07 DIAGNOSIS — Z1389 Encounter for screening for other disorder: Secondary | ICD-10-CM | POA: Diagnosis not present

## 2017-09-07 DIAGNOSIS — N183 Chronic kidney disease, stage 3 (moderate): Secondary | ICD-10-CM | POA: Diagnosis not present

## 2017-09-07 DIAGNOSIS — J449 Chronic obstructive pulmonary disease, unspecified: Secondary | ICD-10-CM | POA: Diagnosis not present

## 2017-09-07 DIAGNOSIS — E785 Hyperlipidemia, unspecified: Secondary | ICD-10-CM | POA: Diagnosis not present

## 2017-09-07 DIAGNOSIS — I471 Supraventricular tachycardia: Secondary | ICD-10-CM | POA: Diagnosis not present

## 2017-09-15 ENCOUNTER — Telehealth: Payer: Self-pay | Admitting: Internal Medicine

## 2017-09-15 NOTE — Telephone Encounter (Signed)
Spoke with pt, verified that she received a HD flu shot on 10/1 through her PCP.  Pt's chart has been updated to reflect this.  Nothing further needed.

## 2017-09-29 DIAGNOSIS — J449 Chronic obstructive pulmonary disease, unspecified: Secondary | ICD-10-CM | POA: Diagnosis not present

## 2017-09-29 DIAGNOSIS — E785 Hyperlipidemia, unspecified: Secondary | ICD-10-CM | POA: Diagnosis not present

## 2017-10-08 ENCOUNTER — Ambulatory Visit: Payer: Medicare Other | Admitting: Cardiology

## 2017-10-10 ENCOUNTER — Emergency Department (HOSPITAL_COMMUNITY): Payer: Medicare Other

## 2017-10-10 ENCOUNTER — Encounter (HOSPITAL_COMMUNITY): Payer: Self-pay

## 2017-10-10 ENCOUNTER — Inpatient Hospital Stay (HOSPITAL_COMMUNITY)
Admission: EM | Admit: 2017-10-10 | Discharge: 2017-10-20 | DRG: 208 | Disposition: A | Discharge status: Home / Self Care | Payer: Medicare Other | Attending: Internal Medicine | Admitting: Internal Medicine

## 2017-10-10 DIAGNOSIS — E785 Hyperlipidemia, unspecified: Secondary | ICD-10-CM | POA: Diagnosis present

## 2017-10-10 DIAGNOSIS — R791 Abnormal coagulation profile: Secondary | ICD-10-CM | POA: Diagnosis not present

## 2017-10-10 DIAGNOSIS — Z880 Allergy status to penicillin: Secondary | ICD-10-CM

## 2017-10-10 DIAGNOSIS — I2510 Atherosclerotic heart disease of native coronary artery without angina pectoris: Secondary | ICD-10-CM | POA: Diagnosis present

## 2017-10-10 DIAGNOSIS — Z4682 Encounter for fitting and adjustment of non-vascular catheter: Secondary | ICD-10-CM | POA: Diagnosis not present

## 2017-10-10 DIAGNOSIS — R5381 Other malaise: Secondary | ICD-10-CM

## 2017-10-10 DIAGNOSIS — J8410 Pulmonary fibrosis, unspecified: Secondary | ICD-10-CM | POA: Diagnosis present

## 2017-10-10 DIAGNOSIS — R4020 Unspecified coma: Secondary | ICD-10-CM | POA: Diagnosis present

## 2017-10-10 DIAGNOSIS — R079 Chest pain, unspecified: Secondary | ICD-10-CM | POA: Diagnosis not present

## 2017-10-10 DIAGNOSIS — G9341 Metabolic encephalopathy: Secondary | ICD-10-CM | POA: Diagnosis present

## 2017-10-10 DIAGNOSIS — I248 Other forms of acute ischemic heart disease: Secondary | ICD-10-CM | POA: Diagnosis not present

## 2017-10-10 DIAGNOSIS — Z808 Family history of malignant neoplasm of other organs or systems: Secondary | ICD-10-CM

## 2017-10-10 DIAGNOSIS — J90 Pleural effusion, not elsewhere classified: Secondary | ICD-10-CM | POA: Diagnosis not present

## 2017-10-10 DIAGNOSIS — R0602 Shortness of breath: Secondary | ICD-10-CM | POA: Diagnosis not present

## 2017-10-10 DIAGNOSIS — I4891 Unspecified atrial fibrillation: Secondary | ICD-10-CM | POA: Diagnosis present

## 2017-10-10 DIAGNOSIS — I2729 Other secondary pulmonary hypertension: Secondary | ICD-10-CM | POA: Diagnosis present

## 2017-10-10 DIAGNOSIS — I130 Hypertensive heart and chronic kidney disease with heart failure and stage 1 through stage 4 chronic kidney disease, or unspecified chronic kidney disease: Secondary | ICD-10-CM | POA: Diagnosis not present

## 2017-10-10 DIAGNOSIS — J181 Lobar pneumonia, unspecified organism: Secondary | ICD-10-CM | POA: Diagnosis not present

## 2017-10-10 DIAGNOSIS — R0902 Hypoxemia: Secondary | ICD-10-CM | POA: Diagnosis not present

## 2017-10-10 DIAGNOSIS — R000 Tachycardia, unspecified: Secondary | ICD-10-CM

## 2017-10-10 DIAGNOSIS — D649 Anemia, unspecified: Secondary | ICD-10-CM | POA: Diagnosis present

## 2017-10-10 DIAGNOSIS — J441 Chronic obstructive pulmonary disease with (acute) exacerbation: Principal | ICD-10-CM | POA: Diagnosis present

## 2017-10-10 DIAGNOSIS — R739 Hyperglycemia, unspecified: Secondary | ICD-10-CM | POA: Diagnosis not present

## 2017-10-10 DIAGNOSIS — Z8542 Personal history of malignant neoplasm of other parts of uterus: Secondary | ICD-10-CM

## 2017-10-10 DIAGNOSIS — N179 Acute kidney failure, unspecified: Secondary | ICD-10-CM | POA: Diagnosis not present

## 2017-10-10 DIAGNOSIS — R0600 Dyspnea, unspecified: Secondary | ICD-10-CM | POA: Diagnosis not present

## 2017-10-10 DIAGNOSIS — J9691 Respiratory failure, unspecified with hypoxia: Secondary | ICD-10-CM | POA: Diagnosis not present

## 2017-10-10 DIAGNOSIS — Z87891 Personal history of nicotine dependence: Secondary | ICD-10-CM | POA: Diagnosis not present

## 2017-10-10 DIAGNOSIS — F419 Anxiety disorder, unspecified: Secondary | ICD-10-CM | POA: Diagnosis present

## 2017-10-10 DIAGNOSIS — J9690 Respiratory failure, unspecified, unspecified whether with hypoxia or hypercapnia: Secondary | ICD-10-CM

## 2017-10-10 DIAGNOSIS — Z79899 Other long term (current) drug therapy: Secondary | ICD-10-CM

## 2017-10-10 DIAGNOSIS — R4182 Altered mental status, unspecified: Secondary | ICD-10-CM | POA: Diagnosis not present

## 2017-10-10 DIAGNOSIS — G2581 Restless legs syndrome: Secondary | ICD-10-CM | POA: Diagnosis present

## 2017-10-10 DIAGNOSIS — Z9981 Dependence on supplemental oxygen: Secondary | ICD-10-CM

## 2017-10-10 DIAGNOSIS — J9622 Acute and chronic respiratory failure with hypercapnia: Secondary | ICD-10-CM | POA: Diagnosis not present

## 2017-10-10 DIAGNOSIS — E872 Acidosis: Secondary | ICD-10-CM | POA: Diagnosis not present

## 2017-10-10 DIAGNOSIS — R188 Other ascites: Secondary | ICD-10-CM | POA: Diagnosis not present

## 2017-10-10 DIAGNOSIS — N183 Chronic kidney disease, stage 3 (moderate): Secondary | ICD-10-CM | POA: Diagnosis present

## 2017-10-10 DIAGNOSIS — I700 Atherosclerosis of aorta: Secondary | ICD-10-CM | POA: Diagnosis present

## 2017-10-10 DIAGNOSIS — J449 Chronic obstructive pulmonary disease, unspecified: Secondary | ICD-10-CM | POA: Diagnosis not present

## 2017-10-10 DIAGNOSIS — J9602 Acute respiratory failure with hypercapnia: Secondary | ICD-10-CM | POA: Diagnosis not present

## 2017-10-10 DIAGNOSIS — J9601 Acute respiratory failure with hypoxia: Secondary | ICD-10-CM | POA: Diagnosis not present

## 2017-10-10 DIAGNOSIS — J438 Other emphysema: Secondary | ICD-10-CM | POA: Diagnosis not present

## 2017-10-10 DIAGNOSIS — R918 Other nonspecific abnormal finding of lung field: Secondary | ICD-10-CM | POA: Diagnosis not present

## 2017-10-10 DIAGNOSIS — G839 Paralytic syndrome, unspecified: Secondary | ICD-10-CM | POA: Diagnosis present

## 2017-10-10 DIAGNOSIS — E876 Hypokalemia: Secondary | ICD-10-CM | POA: Diagnosis present

## 2017-10-10 DIAGNOSIS — I509 Heart failure, unspecified: Secondary | ICD-10-CM | POA: Diagnosis not present

## 2017-10-10 DIAGNOSIS — R069 Unspecified abnormalities of breathing: Secondary | ICD-10-CM | POA: Diagnosis not present

## 2017-10-10 DIAGNOSIS — Z7901 Long term (current) use of anticoagulants: Secondary | ICD-10-CM

## 2017-10-10 DIAGNOSIS — G9340 Encephalopathy, unspecified: Secondary | ICD-10-CM | POA: Diagnosis not present

## 2017-10-10 DIAGNOSIS — I7389 Other specified peripheral vascular diseases: Secondary | ICD-10-CM | POA: Diagnosis present

## 2017-10-10 DIAGNOSIS — I82409 Acute embolism and thrombosis of unspecified deep veins of unspecified lower extremity: Secondary | ICD-10-CM | POA: Diagnosis not present

## 2017-10-10 DIAGNOSIS — M1990 Unspecified osteoarthritis, unspecified site: Secondary | ICD-10-CM | POA: Diagnosis present

## 2017-10-10 DIAGNOSIS — J9621 Acute and chronic respiratory failure with hypoxia: Secondary | ICD-10-CM | POA: Diagnosis not present

## 2017-10-10 DIAGNOSIS — I50810 Right heart failure, unspecified: Secondary | ICD-10-CM | POA: Diagnosis present

## 2017-10-10 DIAGNOSIS — I471 Supraventricular tachycardia: Secondary | ICD-10-CM | POA: Diagnosis not present

## 2017-10-10 DIAGNOSIS — Z91041 Radiographic dye allergy status: Secondary | ICD-10-CM

## 2017-10-10 DIAGNOSIS — I129 Hypertensive chronic kidney disease with stage 1 through stage 4 chronic kidney disease, or unspecified chronic kidney disease: Secondary | ICD-10-CM | POA: Diagnosis present

## 2017-10-10 DIAGNOSIS — K589 Irritable bowel syndrome without diarrhea: Secondary | ICD-10-CM | POA: Diagnosis present

## 2017-10-10 DIAGNOSIS — J439 Emphysema, unspecified: Secondary | ICD-10-CM | POA: Diagnosis not present

## 2017-10-10 LAB — URINALYSIS, ROUTINE W REFLEX MICROSCOPIC
BACTERIA UA: NONE SEEN
Bilirubin Urine: NEGATIVE
Glucose, UA: NEGATIVE mg/dL
HGB URINE DIPSTICK: NEGATIVE
Ketones, ur: 5 mg/dL — AB
LEUKOCYTES UA: NEGATIVE
Nitrite: NEGATIVE
PROTEIN: 30 mg/dL — AB
RBC / HPF: NONE SEEN RBC/hpf (ref 0–5)
Specific Gravity, Urine: 1.016 (ref 1.005–1.030)
pH: 5 (ref 5.0–8.0)

## 2017-10-10 LAB — TRIGLYCERIDES: TRIGLYCERIDES: 62 mg/dL (ref <150)

## 2017-10-10 LAB — POCT I-STAT 3, ART BLOOD GAS (G3+)
ACID-BASE DEFICIT: 5 mmol/L — AB (ref 0.0–2.0)
Acid-Base Excess: 3 mmol/L — ABNORMAL HIGH (ref 0.0–2.0)
Bicarbonate: 24.9 mmol/L (ref 20.0–28.0)
Bicarbonate: 27.7 mmol/L (ref 20.0–28.0)
O2 SAT: 91 %
O2 Saturation: 98 %
PCO2 ART: 61.4 mmHg — AB (ref 32.0–48.0)
PCO2 ART: 40.2 mmHg (ref 32.0–48.0)
PH ART: 7.197 — AB (ref 7.350–7.450)
PH ART: 7.447 (ref 7.350–7.450)
PO2 ART: 64 mmHg — AB (ref 83.0–108.0)
PO2 ART: 102 mmHg (ref 83.0–108.0)
Patient temperature: 92.8
Patient temperature: 99
TCO2: 27 mmol/L (ref 22–32)
TCO2: 29 mmol/L (ref 22–32)

## 2017-10-10 LAB — I-STAT ARTERIAL BLOOD GAS, ED
ACID-BASE EXCESS: 4 mmol/L — AB (ref 0.0–2.0)
Bicarbonate: 33.7 mmol/L — ABNORMAL HIGH (ref 20.0–28.0)
O2 SAT: 100 %
PH ART: 7.2 — AB (ref 7.350–7.450)
PO2 ART: 248 mmHg — AB (ref 83.0–108.0)
TCO2: 36 mmol/L — ABNORMAL HIGH (ref 22–32)
pCO2 arterial: 86.2 mmHg (ref 32.0–48.0)

## 2017-10-10 LAB — COMPREHENSIVE METABOLIC PANEL
ALBUMIN: 3.1 g/dL — AB (ref 3.5–5.0)
ALK PHOS: 42 U/L (ref 38–126)
ALT: 6 U/L — AB (ref 14–54)
AST: 17 U/L (ref 15–41)
Anion gap: 8 (ref 5–15)
BUN: 25 mg/dL — AB (ref 6–20)
CALCIUM: 8.2 mg/dL — AB (ref 8.9–10.3)
CO2: 32 mmol/L (ref 22–32)
CREATININE: 1.87 mg/dL — AB (ref 0.44–1.00)
Chloride: 98 mmol/L — ABNORMAL LOW (ref 101–111)
GFR calc Af Amer: 29 mL/min — ABNORMAL LOW (ref >60)
GFR calc non Af Amer: 25 mL/min — ABNORMAL LOW (ref >60)
GLUCOSE: 190 mg/dL — AB (ref 65–99)
Potassium: 5.1 mmol/L (ref 3.5–5.1)
SODIUM: 138 mmol/L (ref 135–145)
Total Bilirubin: 0.4 mg/dL (ref 0.3–1.2)
Total Protein: 5.7 g/dL — ABNORMAL LOW (ref 6.5–8.1)

## 2017-10-10 LAB — HEMOGLOBIN A1C
HEMOGLOBIN A1C: 5.4 % (ref 4.8–5.6)
Mean Plasma Glucose: 108.28 mg/dL

## 2017-10-10 LAB — LACTIC ACID, PLASMA
LACTIC ACID, VENOUS: 3.6 mmol/L — AB (ref 0.5–1.9)
Lactic Acid, Venous: 4.1 mmol/L (ref 0.5–1.9)

## 2017-10-10 LAB — CBC WITH DIFFERENTIAL/PLATELET
BASOS PCT: 0 %
Basophils Absolute: 0 10*3/uL (ref 0.0–0.1)
EOS ABS: 0 10*3/uL (ref 0.0–0.7)
EOS PCT: 0 %
HEMATOCRIT: 35.5 % — AB (ref 36.0–46.0)
Hemoglobin: 10.4 g/dL — ABNORMAL LOW (ref 12.0–15.0)
Lymphocytes Relative: 14 %
Lymphs Abs: 1.1 10*3/uL (ref 0.7–4.0)
MCH: 29.2 pg (ref 26.0–34.0)
MCHC: 29.3 g/dL — AB (ref 30.0–36.0)
MCV: 99.7 fL (ref 78.0–100.0)
MONO ABS: 0.2 10*3/uL (ref 0.1–1.0)
MONOS PCT: 2 %
Neutro Abs: 6.5 10*3/uL (ref 1.7–7.7)
Neutrophils Relative %: 84 %
Platelets: 199 10*3/uL (ref 150–400)
RBC: 3.56 MIL/uL — ABNORMAL LOW (ref 3.87–5.11)
RDW: 12.6 % (ref 11.5–15.5)
WBC: 7.8 10*3/uL (ref 4.0–10.5)

## 2017-10-10 LAB — RAPID URINE DRUG SCREEN, HOSP PERFORMED
AMPHETAMINES: NOT DETECTED
Barbiturates: NOT DETECTED
Benzodiazepines: POSITIVE — AB
Cocaine: NOT DETECTED
OPIATES: NOT DETECTED
Tetrahydrocannabinol: NOT DETECTED

## 2017-10-10 LAB — PROTIME-INR
INR: 0.96
PROTHROMBIN TIME: 12.7 s (ref 11.4–15.2)

## 2017-10-10 LAB — ETHANOL: Alcohol, Ethyl (B): <10 mg/dL (ref <10)

## 2017-10-10 LAB — D-DIMER, QUANTITATIVE: D-Dimer, Quant: 1.93 ug{FEU}/mL — ABNORMAL HIGH (ref 0.00–0.50)

## 2017-10-10 LAB — MRSA PCR SCREENING: MRSA BY PCR: NEGATIVE

## 2017-10-10 LAB — GLUCOSE, CAPILLARY
GLUCOSE-CAPILLARY: 185 mg/dL — AB (ref 65–99)
GLUCOSE-CAPILLARY: 170 mg/dL — AB (ref 65–99)

## 2017-10-10 LAB — PROCALCITONIN

## 2017-10-10 LAB — TROPONIN I: Troponin I: <0.03 ng/mL (ref <0.03)

## 2017-10-10 LAB — I-STAT CG4 LACTIC ACID, ED: LACTIC ACID, VENOUS: 0.71 mmol/L (ref 0.5–1.9)

## 2017-10-10 MED ORDER — METHYLPREDNISOLONE SODIUM SUCC 125 MG IJ SOLR
60.0000 mg | Freq: Four times a day (QID) | INTRAMUSCULAR | Status: DC
  Administered 2017-10-10 – 2017-10-11 (×4): 60 mg via INTRAVENOUS
  Filled 2017-10-10 (×4): qty 2

## 2017-10-10 MED ORDER — DOXYCYCLINE HYCLATE 100 MG PO TABS
100.0000 mg | ORAL_TABLET | Freq: Two times a day (BID) | ORAL | Status: DC
  Filled 2017-10-10: qty 1

## 2017-10-10 MED ORDER — SODIUM CHLORIDE 0.9 % IV BOLUS (SEPSIS)
1000.0000 mL | Freq: Once | INTRAVENOUS | Status: AC
  Administered 2017-10-10: 1000 mL via INTRAVENOUS

## 2017-10-10 MED ORDER — MIDAZOLAM HCL 2 MG/2ML IJ SOLN
1.0000 mg | INTRAMUSCULAR | Status: AC | PRN
  Administered 2017-10-11 – 2017-10-12 (×3): 1 mg via INTRAVENOUS
  Filled 2017-10-10 (×4): qty 2

## 2017-10-10 MED ORDER — HEPARIN SODIUM (PORCINE) 5000 UNIT/ML IJ SOLN
5000.0000 [IU] | Freq: Three times a day (TID) | INTRAMUSCULAR | Status: DC
  Administered 2017-10-10: 5000 [IU] via SUBCUTANEOUS
  Filled 2017-10-10: qty 1

## 2017-10-10 MED ORDER — METOPROLOL TARTRATE 5 MG/5ML IV SOLN
2.5000 mg | Freq: Once | INTRAVENOUS | Status: AC
  Administered 2017-10-10: 2.5 mg via INTRAVENOUS
  Filled 2017-10-10: qty 5

## 2017-10-10 MED ORDER — PROPOFOL 1000 MG/100ML IV EMUL
0.0000 ug/kg/min | INTRAVENOUS | Status: DC
  Administered 2017-10-10: 5 ug/kg/min via INTRAVENOUS

## 2017-10-10 MED ORDER — PROPOFOL 1000 MG/100ML IV EMUL
INTRAVENOUS | Status: AC
  Filled 2017-10-10: qty 100

## 2017-10-10 MED ORDER — SODIUM BICARBONATE 8.4 % IV SOLN
50.0000 meq | INTRAVENOUS | Status: AC
  Administered 2017-10-10 (×3): 50 meq via INTRAVENOUS
  Filled 2017-10-10: qty 100
  Filled 2017-10-10: qty 50

## 2017-10-10 MED ORDER — MIDAZOLAM HCL 2 MG/2ML IJ SOLN
1.0000 mg | INTRAMUSCULAR | Status: DC | PRN
  Administered 2017-10-11 – 2017-10-15 (×16): 1 mg via INTRAVENOUS
  Filled 2017-10-10 (×15): qty 2

## 2017-10-10 MED ORDER — FAMOTIDINE IN NACL 20-0.9 MG/50ML-% IV SOLN
20.0000 mg | INTRAVENOUS | Status: DC
  Administered 2017-10-10 – 2017-10-15 (×6): 20 mg via INTRAVENOUS
  Filled 2017-10-10 (×6): qty 50

## 2017-10-10 MED ORDER — PROPOFOL 1000 MG/100ML IV EMUL
0.0000 ug/kg/min | INTRAVENOUS | Status: DC
  Administered 2017-10-10 – 2017-10-11 (×2): 50 ug/kg/min via INTRAVENOUS
  Filled 2017-10-10 (×3): qty 100

## 2017-10-10 MED ORDER — ALBUTEROL SULFATE (2.5 MG/3ML) 0.083% IN NEBU: 2.5000 mg | INHALATION_SOLUTION | RESPIRATORY_TRACT | Status: DC | PRN

## 2017-10-10 MED ORDER — INSULIN ASPART 100 UNIT/ML ~~LOC~~ SOLN
0.0000 [IU] | Freq: Three times a day (TID) | SUBCUTANEOUS | Status: DC
  Administered 2017-10-10: 2 [IU] via SUBCUTANEOUS

## 2017-10-10 MED ORDER — HEPARIN (PORCINE) IN NACL 100-0.45 UNIT/ML-% IJ SOLN
900.0000 [IU]/h | INTRAMUSCULAR | Status: DC
  Administered 2017-10-10: 900 [IU]/h via INTRAVENOUS
  Filled 2017-10-10: qty 250

## 2017-10-10 MED ORDER — CHLORHEXIDINE GLUCONATE 0.12% ORAL RINSE (MEDLINE KIT)
15.0000 mL | Freq: Two times a day (BID) | OROMUCOSAL | Status: DC
  Administered 2017-10-10 – 2017-10-17 (×14): 15 mL via OROMUCOSAL

## 2017-10-10 MED ORDER — LACTATED RINGERS IV SOLN
INTRAVENOUS | Status: DC
  Administered 2017-10-10: 16:00:00 via INTRAVENOUS

## 2017-10-10 MED ORDER — ROCURONIUM BROMIDE 50 MG/5ML IV SOLN
INTRAVENOUS | Status: AC | PRN
  Administered 2017-10-10: 50 mg via INTRAVENOUS

## 2017-10-10 MED ORDER — HEPARIN BOLUS VIA INFUSION
2500.0000 [IU] | Freq: Once | INTRAVENOUS | Status: AC
  Administered 2017-10-10: 2500 [IU] via INTRAVENOUS
  Filled 2017-10-10: qty 2500

## 2017-10-10 MED ORDER — PHENYLEPHRINE 40 MCG/ML (10ML) SYRINGE FOR IV PUSH (FOR BLOOD PRESSURE SUPPORT)
120.0000 ug | PREFILLED_SYRINGE | Freq: Once | INTRAVENOUS | Status: AC
  Administered 2017-10-10: 80 ug via INTRAVENOUS
  Filled 2017-10-10: qty 10

## 2017-10-10 MED ORDER — FENTANYL CITRATE (PF) 100 MCG/2ML IJ SOLN
50.0000 ug | INTRAMUSCULAR | Status: AC | PRN
  Administered 2017-10-11 (×3): 50 ug via INTRAVENOUS
  Filled 2017-10-10 (×2): qty 2

## 2017-10-10 MED ORDER — MAGNESIUM SULFATE 2 GM/50ML IV SOLN
2.0000 g | Freq: Once | INTRAVENOUS | Status: AC
  Administered 2017-10-10: 2 g via INTRAVENOUS
  Filled 2017-10-10: qty 50

## 2017-10-10 MED ORDER — METHYLPREDNISOLONE SODIUM SUCC 125 MG IJ SOLR: 60.0000 mg | Freq: Four times a day (QID) | INTRAMUSCULAR | Status: DC

## 2017-10-10 MED ORDER — IPRATROPIUM-ALBUTEROL 0.5-2.5 (3) MG/3ML IN SOLN
RESPIRATORY_TRACT | Status: AC
  Administered 2017-10-10: 3 mL
  Filled 2017-10-10: qty 3

## 2017-10-10 MED ORDER — IPRATROPIUM-ALBUTEROL 0.5-2.5 (3) MG/3ML IN SOLN
3.0000 mL | Freq: Four times a day (QID) | RESPIRATORY_TRACT | Status: DC
  Administered 2017-10-10 – 2017-10-12 (×10): 3 mL via RESPIRATORY_TRACT
  Filled 2017-10-10 (×10): qty 3

## 2017-10-10 MED ORDER — SODIUM CHLORIDE 0.9 % IV SOLN
0.0000 ug/min | INTRAVENOUS | Status: DC
  Administered 2017-10-10: 20 ug/min via INTRAVENOUS
  Administered 2017-10-10: 60 ug/min via INTRAVENOUS
  Administered 2017-10-11: 40 ug/min via INTRAVENOUS
  Filled 2017-10-10 (×8): qty 1

## 2017-10-10 MED ORDER — METOPROLOL TARTRATE 25 MG/10 ML ORAL SUSPENSION
25.0000 mg | Freq: Two times a day (BID) | ORAL | Status: DC
  Administered 2017-10-10 – 2017-10-11 (×3): 25 mg
  Filled 2017-10-10 (×3): qty 10

## 2017-10-10 MED ORDER — CIPROFLOXACIN IN D5W 400 MG/200ML IV SOLN
400.0000 mg | INTRAVENOUS | Status: DC
  Administered 2017-10-10: 400 mg via INTRAVENOUS
  Filled 2017-10-10: qty 200

## 2017-10-10 MED ORDER — ORAL CARE MOUTH RINSE
15.0000 mL | OROMUCOSAL | Status: DC
  Administered 2017-10-10 – 2017-10-14 (×40): 15 mL via OROMUCOSAL

## 2017-10-10 MED ORDER — FENTANYL CITRATE (PF) 100 MCG/2ML IJ SOLN
50.0000 ug | INTRAMUSCULAR | Status: DC | PRN
  Administered 2017-10-11 (×3): 50 ug via INTRAVENOUS
  Filled 2017-10-10 (×6): qty 2

## 2017-10-10 MED ORDER — DOXYCYCLINE CALCIUM 50 MG/5ML PO SYRP
100.0000 mg | ORAL_SOLUTION | Freq: Two times a day (BID) | ORAL | Status: DC
  Filled 2017-10-10: qty 10

## 2017-10-10 MED ORDER — SODIUM BICARBONATE 4.2 % IV SOLN
50.0000 meq | INTRAVENOUS | Status: DC
  Filled 2017-10-10 (×3): qty 100

## 2017-10-10 MED ORDER — ETOMIDATE 2 MG/ML IV SOLN
INTRAVENOUS | Status: AC | PRN
  Administered 2017-10-10: 20 mg via INTRAVENOUS

## 2017-10-10 NOTE — Progress Notes (Signed)
ANTICOAGULATION CONSULT NOTE - Initial Consult  Pharmacy Consult for Heparin Indication: r/o PE  Allergies  Allergen Reactions  . Contrast Media [Iodinated Diagnostic Agents]     LOW KIDNEY FUNCTION///NEED TO CHECK WITH NEPHROLOGIST BEFORE PROCEDURE.  . Biaxin [Clarithromycin] Hives  . Penicillins Hives  . Sulfa Antibiotics Rash    Patient Measurements: Height: 5\' 5"  (165.1 cm) Weight: 121 lb 7.6 oz (55.1 kg) IBW/kg (Calculated) : 57 Heparin Dosing Weight: 55.1 kg  Vital Signs: Temp: 99.6 F (37.6 C) (11/03 2003) Temp Source: Axillary (11/03 2003) BP: 131/62 (11/03 1945) Pulse Rate: 131 (11/03 1945)  Labs:  Recent Labs  10/10/17 1011 10/10/17 1841  HGB 10.4*  --   HCT 35.5*  --   PLT 199  --   LABPROT 12.7  --   INR 0.96  --   CREATININE 1.87*  --   TROPONINI  --  <0.03    Estimated Creatinine Clearance: 21.9 mL/min (A) (by C-G formula based on SCr of 1.87 mg/dL (H)).   Medical History: Past Medical History:  Diagnosis Date  . Chronic renal insufficiency   . COPD (chronic obstructive pulmonary disease) ([place])   . Diverticulosis   . DJD (degenerative joint disease)   . Endometrial cancer ([place])   . Hyperlipidemia   . Hypertension   . IBS (irritable bowel syndrome)   . RLS (restless legs syndrome)   . SVT (supraventricular tachycardia) ([place])   . Uterine cancer ([facility])     Assessment: CC/HPI: unresponsiveness  PMH: COPD on home oxygen, CKD, DJD, endometrial and uterine cancer, HLD, HTN, IBS, RLS, SVT  Anticoag: Ddimer elevated 1.93. Hgb low 10.4. Plts 199 ok. Start IV heparin.  Goal of Therapy:  Heparin level 0.3-0.7 units/ml Monitor platelets by anticoagulation protocol: Yes   Plan:  D/c SQ heparin Heparin 2500 unit IV bolus (got SQ heparin 7 hrs ago) Heparin infusion 900 units/hr Daily HL and CBC  Carolynn Tuley S. Alford Highland, PharmD, BCPS Clinical Staff Pharmacist Pager [phone]  Eilene Ghazi Stillinger 10/10/2017,9:33 PM

## 2017-10-10 NOTE — Progress Notes (Signed)
[facility] Progress Note Patient Name: Carol Carson DOB: August 16, 1941 MRN: [id number]   Date of Service  10/10/2017  HPI/Events of Note  Lactic Acid = 3.6 --> 4.1. Last LVEF = 68%. No CVL or CVP available. BP = 125/65.  eICU Interventions  Will order 0.9 NaCl 1 liter IV over 1 hour now.      Intervention Category Major Interventions: Acid-Base disturbance - evaluation and management  Sommer,Steven Eugene 10/10/2017, 8:04 PM

## 2017-10-10 NOTE — H&P (Addendum)
PULMONARY / CRITICAL CARE MEDICINE   Name: Carol Carson MRN: [id number] DOB: 1941-01-12    ADMISSION DATE:  10/10/2017  CHIEF COMPLAINT:  coma  HISTORY OF PRESENT ILLNESS:   This is a 76 year old with O2 dependent COPD who was found unresponsive this morning.  By report she was normal when she went to bed last night, not having increased dyspnea or cough.  He had pinpoint pupils and was wheezing.  She was given Narcan without any improvement.  On arrival in the department of emergency medicine she had no gag and no corneals.  She was given etomidate and rocuronium for intubation.  PAST MEDICAL HISTORY :  She  has a past medical history of Chronic renal insufficiency; COPD (chronic obstructive pulmonary disease) ([place]); Diverticulosis; DJD (degenerative joint disease); Endometrial cancer ([place]); Hyperlipidemia; Hypertension; IBS (irritable bowel syndrome); RLS (restless legs syndrome); SVT (supraventricular tachycardia) ([place]); and Uterine cancer ([place]).  PAST SURGICAL HISTORY: She  has a past surgical history that includes Lung surgery (2002) and Abdominal hysterectomy.  Allergies  Allergen Reactions  . Contrast Media [Iodinated Diagnostic Agents]     LOW KIDNEY FUNCTION///NEED TO CHECK WITH NEPHROLOGIST BEFORE PROCEDURE.  . Biaxin [Clarithromycin] Hives  . Penicillins Hives  . Sulfa Antibiotics Rash    No current facility-administered medications on file prior to encounter.    Current Outpatient Prescriptions on File Prior to Encounter  Medication Sig  . ALPRAZolam (XANAX) 0.25 MG tablet Take 0.25 mg by mouth as needed for anxiety or sleep.   Marland Kitchen amLODipine (NORVASC) 5 MG tablet Take 1 tablet (5 mg total) by mouth daily.  Marland Kitchen atenolol (TENORMIN) 25 MG tablet Take 1 tablet (25 mg total) by mouth 2 (two) times daily.  . cholecalciferol (VITAMIN D) 1000 UNITS tablet Take 1,000 Units by mouth daily.  . flecainide (TAMBOCOR) 50 MG tablet Take 1 tablet (50 mg total) by mouth 2 (two) times  daily.  Marland Kitchen levalbuterol (XOPENEX HFA) 45 MCG/ACT inhaler Inhale 2 puffs into the lungs every 4 (four) hours as needed for wheezing or shortness of breath.  . Multiple Vitamins-Minerals (CENTRUM SILVER PO) Take 1 tablet by mouth daily.  . NON FORMULARY Oxygen 2lpm with rest, 4 with exertion and 3 lpm with sleep  . rOPINIRole (REQUIP) 0.25 MG tablet Take 1 tablet (0.25 mg total) by mouth 2 (two) times daily.  . simvastatin (ZOCOR) 20 MG tablet TK 1 T PO  QPM.  . umeclidinium-vilanterol (ANORO ELLIPTA) 62.5-25 MCG/INH AEPB Inhale 1 puff into the lungs daily.    FAMILY HISTORY:  According to her son there is no significant family history  SOCIAL HISTORY: She  reports that she quit smoking about 18 years ago. Her smoking use included Cigarettes. She has a 55.90 pack-year smoking history. She has never used smokeless tobacco. She reports that she does not drink alcohol or use drugs.  REVIEW OF SYSTEMS:   10 system review of systems was obtained from the family.  He contributes that at baseline she has O2 dependent COPD but she is able to walk from the bed to the bathroom without assistance.  She is also able to do her own shopping.  She is never previously had a CNS event specifically no history of stroke or TIA.  They are not specifically aware of why she takes flecainide but say that she had a rapid heartbeat.  They are not aware that she is ever had treatment with anticoagulants associated with her arrhythmia.  He denies a prior history of  diabetes despite her elevated glucose today.  SUBJECTIVE:  Obtainable from the patient.  VITAL SIGNS: BP (!) 109/55   Pulse 82   Temp (!) 96.5 F (35.8 C) (Temporal)   Resp (!) 26   Ht 5\' 5"  (1.651 m)   Wt (!) 1220 lb (553.4 kg)   SpO2 100%   BMI 203.02 kg/m   HEMODYNAMICS:    VENTILATOR SETTINGS: Vent Mode: PRVC FiO2 (%):  [40 %] 40 % Set Rate:  [22 bmp] 22 bmp Vt Set:  [410 mL] 410 mL PEEP:  [5 cmH20] 5 cmH20 Plateau Pressure:  [28 cmH20] 28  cmH20  INTAKE / OUTPUT: No intake/output data recorded.  PHYSICAL EXAMINATION: General: Orally intubated, mechanically ventilated, and chronically ill-appearing. Neuro: Still flaccid following the rocuronium.  Pupils are 3 mm equal and reactive. Cardiovascular: No significant JVD.  There are no carotid bruits.  S1 and S2 are distant without murmur rub or gallop. Lungs: There is symmetric air movement no wheezes but a very long expiratory phase and trapping by flow versus time.  There are lots of scattered rhonchi. Abdomen: The abdomen is soft without any organomegaly masses tenderness guarding or rebound Musculoskeletal: There is no dependent edema, the limbs are warm  LABS:  BMET  Recent Labs Lab 10/10/17 1011  NA 138  K 5.1  CL 98*  CO2 32  BUN 25*  CREATININE 1.87*  GLUCOSE 190*    Electrolytes  Recent Labs Lab 10/10/17 1011  CALCIUM 8.2*    CBC  Recent Labs Lab 10/10/17 1011  WBC 7.8  HGB 10.4*  HCT 35.5*  PLT 199    Coag's  Recent Labs Lab 10/10/17 1011  INR 0.96    Sepsis Markers  Recent Labs Lab 10/10/17 1018  LATICACIDVEN 0.71    ABG  Recent Labs Lab 10/10/17 1044  PHART 7.200*  PCO2ART 86.2*  PO2ART 248.0*    Liver Enzymes  Recent Labs Lab 10/10/17 1011  AST 17  ALT 6*  ALKPHOS 42  BILITOT 0.4  ALBUMIN 3.1*    Cardiac Enzymes No results for input(s): TROPONINI, PROBNP in the last 168 hours.  Glucose No results for input(s): GLUCAP in the last 168 hours.  Imaging Ct Head Wo Contrast  Result Date: 10/10/2017 CLINICAL DATA:  Unresponsive EXAM: CT HEAD WITHOUT CONTRAST TECHNIQUE: Contiguous axial images were obtained from the base of the skull through the vertex without intravenous contrast. COMPARISON:  None. FINDINGS: Brain: There is low-density within the anterior limb of the left internal capsule. There is also low-density in the anterior external capsule. The insular cortex is intact. There is no obvious mass  effect or hemorrhage. Nonspecific tiny calcification is present in the right occipital cortex. Chronic ischemic changes in the periventricular white matter are superimposed. Vascular: No hyperdense vessel or unexpected calcification. Skull: Cranium is intact. Sinuses/Orbits: There is mucosal thickening in the anterior ethmoid air cells. Maxillary sinus, sphenoid sinus, and mastoid air cells are clear. Frontal sinuses are clear. Orbits are within normal limits for age. Other: Noncontributory. IMPRESSION: There is low-density within the left internal and external capsule as described. Age indeterminate infarcts are suggested. Please note that the insular cortex is within normal limits and there is no evidence of hyperdense MCA. MRI may be helpful to further characterize. Superimpose chronic ischemic changes are noted. Electronically Signed   By: Marybelle Killings M.D.   On: 10/10/2017 11:26   Dg Chest Portable 1 View  Result Date: 10/10/2017 CLINICAL DATA:  76 year old female with a  history of intubation EXAM: PORTABLE CHEST 1 VIEW COMPARISON:  09/15/2016, 06/28/2015 FINDINGS: Cardiomediastinal silhouette unchanged in size and contour. No evidence of central vascular congestion. Diffuse coarsening of interstitial markings, similar to comparison chest x-rays. No new confluent airspace disease. Architectural distortion at the left lung base, similar to prior. No large pleural effusion. Hyperexpansion with relative flattening of the hemidiaphragms, compatible with emphysema. Calcified nodule the left upper chest is again noted, potentially involving the rib. Interval placement of endotracheal tube, which terminates suitably above the carina, approximately 5.6 cm. Interval placement of gastric tube, terminating out of the field of view. IMPRESSION: Chronic lung changes and emphysema without evidence of superimposed acute cardiopulmonary disease. Interval placement of endotracheal tube which terminates suitably above the  carina. Interval placement gastric tube terminating Electronically Signed   By: Corrie Mckusick D.O.   On: 10/10/2017 10:35   Dg Abd Portable 1 View  Result Date: 10/10/2017 CLINICAL DATA:  76 year old female with recent intubation. EXAM: PORTABLE ABDOMEN - 1 VIEW COMPARISON:  None. FINDINGS: Limited plain film of the upper abdomen demonstrates gastric tube with the side port terminating in the stomach lumen and the tip out of the field of view. Gas within stomach. IMPRESSION: Limited plain film of the upper abdomen demonstrates gastric tube terminating in the stomach, although the tip projects beyond the field of view of the study. Electronically Signed   By: Corrie Mckusick D.O.   On: 10/10/2017 10:36     STUDIES:  CT scan of the head is not remarkable for an acute event to my eye, I am awaiting input from radiology.    DISCUSSION: Coma in a 76 year old with O2 dependent chronic lung disease and apparently a history of A. fib for which she takes flecainide.  Though it is tempting to attribute all of her mental status changes to CO2 retention, he had no corneals and no gag on arrival am concerned that we have had a CNS event not yet manifesting on CT.  MRI has been ordered.  Alternately she does have a marginally prolonged QT likely related to her flecainide and she may have had an arrhythmic event this morning but I think this the less likely event.  ASSESSMENT / PLAN:  PULMONARY A: Significant COPD with air trapping and CO2 retention.  I have started her on systemic steroids as well as inhaled bronchodilators    ENDOCRINE A: Levator glucose with no known history of diabetes.  A1c has been ordered along with sliding scale insulin.  NEUROLOGIC A:   Noted I am concerned that she had a primary CNS event leading to coma and CO2 retention.  MRI scan of the head has been ordered.     Rater than 32 minutes was spent in the care of this patient today Lars Masson, MD  Pulmonary and [place] Pager: [phone]  10/10/2017, 11:39 AM   She has awakened and interacts. She has a lactic acidosis but no overt provocation. Emperically volume loading, check troponin and d-dimer considering other low cardiac output states ans beginning emperic antibiotics pending cultures and procalcitonin. Abdominal exam is benign.

## 2017-10-10 NOTE — ED Triage Notes (Addendum)
Patient arrived by [facility] after being found by family unresponsive in bed and unable to wake up. Per family patient was well yesterday with no complaints. Narcan given pta for pinpoint pupils and no change. Lung sounds tight and given neb and solumedrol 125 pta.  CBG 120 by EMS. Patient with no purposeful movement on arrival and no gag prior to intubation

## 2017-10-10 NOTE — Progress Notes (Signed)
Foley placed per Dr Earl Lites order for strict I&O. Conversation had regarding I/O due to not voiding since arrival to unit at 13:10. He stated she needed a foley catheter due to vasoactive infusions and ABG results.

## 2017-10-10 NOTE — Progress Notes (Signed)
Initial Nutrition Assessment  DOCUMENTATION CODES:   Not applicable  INTERVENTION:  If tubefeeding initiated, recommend wait until patient reaches normothermic temperature - provideVital High Protein at 84mL/hr Provides 1080 calories, 95 grams of protein, 827mL free water With Propofol at 3.69mL/hr, provides 1167 calories (103% estimated needs)  NUTRITION DIAGNOSIS:   Inadequate oral intake related to inability to eat as evidenced by NPO status.  GOAL:   Provide needs based on ASPEN/SCCM guidelines  MONITOR:   I & O's, Labs, TF tolerance, Vent status, Skin  REASON FOR ASSESSMENT:   Ventilator    ASSESSMENT:   Carol Carson has a PMH of O2 Dependent COPD, HLD, HTN, Endometrial and Uterine cancer, presents unresponsive with air trapping and CO2 retention, now intubated  Patient is currently intubated on ventilator support MV: 10.6 L/min Temp (24hrs), Avg:93.2 F (34 C), Min:91.7 F (33.2 C), Max:96.5 F (35.8 C)  Discussed with RN, patient was cold in ED. On bear hugger, will be warmed to about 97 degrees, utilized this temp for calorie calculations. Patient alert, awake, able to nod/shake head for questions. Denies weight loss, nods to good appetite and PO intake.  Propofol: 3.3 ml/hr --> 87.12 calories  No UOP documented  MAP: 75, 84, 62  Labs reviewed:  Medications reviewed and include:  Novolog 0-9 Units TID Solumedrol Lactated Ringers at 17mL/hr Neo gtt  NUTRITION - FOCUSED PHYSICAL EXAM:    Most Recent Value  Orbital Region  No depletion  Upper Arm Region  No depletion  Thoracic and Lumbar Region  No depletion  Buccal Region  No depletion  Temple Region  No depletion  Clavicle Bone Region  No depletion  Clavicle and Acromion Bone Region  No depletion  Scapular Bone Region  No depletion  Dorsal Hand  No depletion  Patellar Region  No depletion  Anterior Thigh Region  No depletion  Posterior Calf Region  No depletion  Edema (RD Assessment)  None   Hair  Reviewed  Eyes  Reviewed  Mouth  Reviewed  Skin  Reviewed  Nails  Reviewed       Diet Order:  Diet NPO time specified  EDUCATION NEEDS:   Not appropriate for education at this time  Skin:  Skin Assessment: Reviewed RN Assessment  Last BM:  PTA  Height:   Ht Readings from Last 1 Encounters:  10/10/17 5\' 5"  (1.651 m)    Weight:   Wt Readings from Last 1 Encounters:  10/10/17 121 lb 7.6 oz (55.1 kg)    Ideal Body Weight:  56.81 kg  BMI:  Body mass index is 20.21 kg/m.  Estimated Nutritional Needs:   Kcal:  1129 calories  Protein:  83-94 grams (1.5-1.7g/kg)  Fluid:  >1.5L    Satira Anis. Arrie Borrelli, MS, RD LDN Inpatient Clinical Dietitian Pager [phone]

## 2017-10-10 NOTE — Progress Notes (Signed)
[facility] ABG results called to Dr Pearline Cables.  Per MD- maintain current vent settings- no new RT orders currently.

## 2017-10-10 NOTE — ED Provider Notes (Signed)
[facility] EMERGENCY DEPARTMENT Provider Note   CSN: [id number] Arrival date & time: 10/10/17  2202     History   Chief Complaint Chief Complaint  Patient presents with  . Shortness of Breath/unresponsive   Level 5 caveat due to unresponsiveness. HPI Carol Carson is a 76 y.o. female.  HPI Patient brought in by EMS after being found unresponsive.  At the time reported only history of hypertension but appears to have this more history than that.  CBG was 120.  Initially hypoxic in the 80s at home.  Given breathing treatments and nasal airways.  Sats had improved.  Patient however remained unresponsive.  Pupils were constricted and no response to Narcan.  Breathing spontaneously upon arrival but no corneal reflex and no response to pain.  No gag reflex either. Past Medical History:  Diagnosis Date  . Chronic renal insufficiency   . COPD (chronic obstructive pulmonary disease) ([place])   . Diverticulosis   . DJD (degenerative joint disease)   . Endometrial cancer ([place])   . Hyperlipidemia   . Hypertension   . IBS (irritable bowel syndrome)   . RLS (restless legs syndrome)   . SVT (supraventricular tachycardia) ([place])   . Uterine cancer [facility])     Patient Active Problem List   Diagnosis Date Noted  . Claudication ([place]) 10/08/2016  . RLS (restless legs syndrome) 10/10/2015  . Essential hypertension   . COPD exacerbation ([place]) 08/05/2013  . Chronic respiratory failure with hypoxia and hypercapnia ([place]) 08/05/2013  . COPD GOLD III/ emphysematous predominantly  01/13/2013  . SVT (supraventricular tachycardia) ([facility])     Past Surgical History:  Procedure Laterality Date  . ABDOMINAL HYSTERECTOMY     d/t unterine cancer///no chemo/no radiation  . LUNG SURGERY  2002   golf ball sized tumor, left lung---spot vanished     OB History    No data available       Home Medications    Prior to Admission medications   Medication Sig Start Date End Date  Taking? Authorizing Provider  ALPRAZolam Duanne Moron) 0.25 MG tablet Take 0.25 mg by mouth as needed for anxiety or sleep.  02/08/15   [provider]  amLODipine (NORVASC) 5 MG tablet Take 1 tablet (5 mg total) by mouth daily. 09/01/17 08/27/18  Sueanne Margarita, MD  atenolol (TENORMIN) 25 MG tablet Take 1 tablet (25 mg total) by mouth 2 (two) times daily. 07/21/17   Camnitz, Ocie Doyne, MD  cholecalciferol (VITAMIN D) 1000 UNITS tablet Take 1,000 Units by mouth daily.    [provider]  flecainide (TAMBOCOR) 50 MG tablet Take 1 tablet (50 mg total) by mouth 2 (two) times daily. 07/29/17   Camnitz, Ocie Doyne, MD  levalbuterol [facility] HFA) 45 MCG/ACT inhaler Inhale 2 puffs into the lungs every 4 (four) hours as needed for wheezing or shortness of breath.    [provider]  Multiple Vitamins-Minerals (CENTRUM SILVER PO) Take 1 tablet by mouth daily.    [provider]  NON FORMULARY Oxygen 2lpm with rest, 4 with exertion and 3 lpm with sleep    [provider]  rOPINIRole (REQUIP) 0.25 MG tablet Take 1 tablet (0.25 mg total) by mouth 2 (two) times daily. 04/14/17   Narda Amber K, DO  simvastatin (ZOCOR) 20 MG tablet TK 1 T PO  QPM. 07/31/17   [provider]  umeclidinium-vilanterol (ANORO ELLIPTA) 62.5-25 MCG/INH AEPB Inhale 1 puff into the lungs daily. 07/17/17   Brand Males,  MD    Family History Family History  Problem Relation Age of Onset  . Uterine cancer Mother   . Brain cancer Father   . Parkinson's disease Brother   . Aneurysm Sister        Brain aneusrym  . Stroke Sister   . Healthy Son   . Breast cancer Neg Hx     Social History Social History  Substance Use Topics  . Smoking status: Former Smoker    Packs/day: 1.30    Years: 43.00    Types: Cigarettes    Quit date: 12/08/1998  . Smokeless tobacco: Never Used  . Alcohol use No     Allergies   Contrast media [iodinated diagnostic agents]; Biaxin [clarithromycin];  Penicillins; and Sulfa antibiotics   Review of Systems Review of Systems  Unable to perform ROS: Patient unresponsive     Physical Exam Updated Vital Signs BP 131/68   Pulse 87   Temp (!) 96.5 F (35.8 C) (Temporal)   Resp 20   Ht 5\' 5"  (1.651 m)   Wt (!) 553.4 kg (1220 lb)   SpO2 100%   BMI 203.02 kg/m   Physical Exam  Constitutional: She appears well-developed.  HENT:  Head: Atraumatic.  Eyes:  Pupils are round 3 mm.  No real response to light.  No corneal reflex.  Neck: Neck supple.  Cardiovascular: Normal rate.   Pulmonary/Chest:  Decreased air movement throughout.  Agonal respirations  Abdominal: She exhibits no distension.  Musculoskeletal: She exhibits no edema.  Neurological:  No corneal reflex.  Breathing spontaneously.  No response to pain.  Skin: Skin is warm.     ED Treatments / Results  Labs (all labs ordered are listed, but only abnormal results are displayed) Labs Reviewed  COMPREHENSIVE METABOLIC PANEL - Abnormal; Notable for the following:       Result Value   Chloride 98 (*)    Glucose, Bld 190 (*)    BUN 25 (*)    Creatinine, Ser 1.87 (*)    Calcium 8.2 (*)    Total Protein 5.7 (*)    Albumin 3.1 (*)    ALT 6 (*)    GFR calc non Af Amer 25 (*)    GFR calc Af Amer 29 (*)    All other components within normal limits  CBC WITH DIFFERENTIAL/PLATELET - Abnormal; Notable for the following:    RBC 3.56 (*)    Hemoglobin 10.4 (*)    HCT 35.5 (*)    MCHC 29.3 (*)    All other components within normal limits  I-STAT ARTERIAL BLOOD GAS, ED - Abnormal; Notable for the following:    pH, Arterial 7.200 (*)    pCO2 arterial 86.2 (*)    pO2, Arterial 248.0 (*)    Bicarbonate 33.7 (*)    TCO2 36 (*)    Acid-Base Excess 4.0 (*)    All other components within normal limits  PROTIME-INR  ETHANOL  URINALYSIS, ROUTINE W REFLEX MICROSCOPIC  RAPID URINE DRUG SCREEN, HOSP PERFORMED  TRIGLYCERIDES  I-STAT CG4 LACTIC ACID, ED  I-STAT CHEM 8, ED     EKG  EKG Interpretation None       Radiology Dg Chest Portable 1 View  Result Date: 10/10/2017 CLINICAL DATA:  76 year old female with a history of intubation EXAM: PORTABLE CHEST 1 VIEW COMPARISON:  09/15/2016, 06/28/2015 FINDINGS: Cardiomediastinal silhouette unchanged in size and contour. No evidence of central vascular congestion. Diffuse coarsening of interstitial markings, similar to comparison chest x-rays.  No new confluent airspace disease. Architectural distortion at the left lung base, similar to prior. No large pleural effusion. Hyperexpansion with relative flattening of the hemidiaphragms, compatible with emphysema. Calcified nodule the left upper chest is again noted, potentially involving the rib. Interval placement of endotracheal tube, which terminates suitably above the carina, approximately 5.6 cm. Interval placement of gastric tube, terminating out of the field of view. IMPRESSION: Chronic lung changes and emphysema without evidence of superimposed acute cardiopulmonary disease. Interval placement of endotracheal tube which terminates suitably above the carina. Interval placement gastric tube terminating Electronically Signed   By: Corrie Mckusick D.O.   On: 10/10/2017 10:35   Dg Abd Portable 1 View  Result Date: 10/10/2017 CLINICAL DATA:  76 year old female with recent intubation. EXAM: PORTABLE ABDOMEN - 1 VIEW COMPARISON:  None. FINDINGS: Limited plain film of the upper abdomen demonstrates gastric tube with the side port terminating in the stomach lumen and the tip out of the field of view. Gas within stomach. IMPRESSION: Limited plain film of the upper abdomen demonstrates gastric tube terminating in the stomach, although the tip projects beyond the field of view of the study. Electronically Signed   By: Corrie Mckusick D.O.   On: 10/10/2017 10:36    Procedures Procedure Name: Intubation Date/Time: 10/10/2017 11:14 AM Performed by: Davonna Belling Pre-anesthesia  Checklist: Emergency Drugs available Oxygen Delivery Method: Non-rebreather mask Preoxygenation: Pre-oxygenation with 100% oxygen Induction Type: Rapid sequence Ventilation: Mask ventilation with difficulty and Nasal airway inserted- appropriate to patient size Laryngoscope Size: Glidescope and 3 Grade View: Grade IV Tube type: Subglottic suction tube Tube size: 7.5 mm Number of attempts: 1 Placement Confirmation: ETT inserted through vocal cords under direct vision,  Positive ETCO2,  CO2 detector and Breath sounds checked- equal and bilateral Secured at: 22 cm Tube secured with: ETT holder      (including critical care time)  Medications Ordered in ED Medications  fentaNYL (SUBLIMAZE) injection 50 mcg (not administered)  fentaNYL (SUBLIMAZE) injection 50 mcg (not administered)  propofol (DIPRIVAN) 1000 MG/100ML infusion (not administered)  midazolam (VERSED) injection 1 mg (not administered)  midazolam (VERSED) injection 1 mg (not administered)  sodium chloride 0.9 % bolus 1,000 mL (1,000 mLs Intravenous New Bag/Given 10/10/17 1015)  phenylephrine (NEO-SYNEPHRINE) 10 mg in sodium chloride 0.9 % 250 mL (0.04 mg/mL) infusion (20 mcg/min Intravenous New Bag/Given 10/10/17 1039)  etomidate (AMIDATE) injection (20 mg Intravenous Given 10/10/17 0955)  rocuronium (ZEMURON) injection (50 mg Intravenous Given 10/10/17 0956)  PHENYLephrine 40 mcg/ml in normal saline Adult IV Push Syringe (80 mcg Intravenous Given 10/10/17 1014)  ipratropium-albuterol (DUONEB) 0.5-2.5 (3) MG/3ML nebulizer solution (3 mLs  Given 10/10/17 1012)     Initial Impression / Assessment and Plan / ED Course  I have reviewed the triage vital signs and the nursing notes.  Pertinent labs & imaging results that were available during my care of the patient were reviewed by me and considered in my medical decision making (see chart for details).     Patient brought in unresponsive.  Intubated by myself for airway  protection and oxygenation.  Unresponsive.  After intubation had a period of hypotension that required push dose phenylephrine.  Chest x-ray reassuring overall.  Seen in the ER by Dr. Pearline Cables from the pulmonary critical care medicine.  Will admit to the ICU.  CRITICAL CARE Performed by: Mackie Pai Total critical care time: 30 minutes Critical care time was exclusive of separately billable procedures and treating other patients. Critical care was necessary  to treat or prevent imminent or life-threatening deterioration. Critical care was time spent personally by me on the following activities: development of treatment plan with patient and/or surrogate as well as nursing, discussions with consultants, evaluation of patient's response to treatment, examination of patient, obtaining history from patient or surrogate, ordering and performing treatments and interventions, ordering and review of laboratory studies, ordering and review of radiographic studies, pulse oximetry and re-evaluation of patient's condition.     Final Clinical Impressions(s) / ED Diagnoses   Final diagnoses:  Respiratory failure with hypoxia, unspecified chronicity ([place])  Encephalopathy    New Prescriptions New Prescriptions   No medications on file     Davonna Belling, MD 10/10/17 1115

## 2017-10-10 NOTE — Progress Notes (Signed)
  Date of Service  10/10/2017   HPI/Events  D Dimer 1.93 unable to get CTA due to AKI   Interventions  -Start Heparin gtt -ECHO and LE Dopplers ordered    Hayden Pedro, AGACNP-BC [facility]  Pgr: [phone]  PCCM Pgr: [phone]

## 2017-10-10 NOTE — Progress Notes (Signed)
[facility] Progress Note Patient Name: Carol Carson DOB: [birth year] MRN: [id number]   Date of Service  10/10/2017  HPI/Events of Note  EKG read as accelerated junctional rhythm.   eICU Interventions  Will order: 1. Metoprolol 2.5 mg IV now.      Intervention Category Major Interventions: Arrhythmia - evaluation and management  [name] 10/10/2017, 11:04 PM

## 2017-10-10 NOTE — Progress Notes (Signed)
[facility] Progress Note Patient Name: ALVENIA TREESE DOB: 09/03/1941 MRN: [id number]   Date of Service  10/10/2017  HPI/Events of Note  Elevated HR - Ventricular rate = 129. Regular, however, not able to appreciate P waves. BP = 128/64. Patient on Atenolol at home.   eICU Interventions  Will order: 1. Metoprolol 25 mg per tube now and BID.  2. 12 Lead EKG now.      Intervention Category Major Interventions: Arrhythmia - evaluation and management  Marlise Fahr [place] 10/10/2017, 10:43 PM

## 2017-10-11 ENCOUNTER — Inpatient Hospital Stay (HOSPITAL_COMMUNITY): Payer: Medicare Other

## 2017-10-11 DIAGNOSIS — J181 Lobar pneumonia, unspecified organism: Secondary | ICD-10-CM

## 2017-10-11 DIAGNOSIS — R0602 Shortness of breath: Secondary | ICD-10-CM

## 2017-10-11 LAB — COMPREHENSIVE METABOLIC PANEL
ALBUMIN: 2.7 g/dL — AB (ref 3.5–5.0)
ALT: 9 U/L — ABNORMAL LOW (ref 14–54)
ANION GAP: 10 (ref 5–15)
AST: 26 U/L (ref 15–41)
Alkaline Phosphatase: 37 U/L — ABNORMAL LOW (ref 38–126)
BUN: 18 mg/dL (ref 6–20)
CHLORIDE: 104 mmol/L (ref 101–111)
CO2: 28 mmol/L (ref 22–32)
Calcium: 7.4 mg/dL — ABNORMAL LOW (ref 8.9–10.3)
Creatinine, Ser: 1.39 mg/dL — ABNORMAL HIGH (ref 0.44–1.00)
GFR calc Af Amer: 41 mL/min — ABNORMAL LOW (ref >60)
GFR calc non Af Amer: 36 mL/min — ABNORMAL LOW (ref >60)
GLUCOSE: 148 mg/dL — AB (ref 65–99)
POTASSIUM: 3.4 mmol/L — AB (ref 3.5–5.1)
SODIUM: 142 mmol/L (ref 135–145)
TOTAL PROTEIN: 5.1 g/dL — AB (ref 6.5–8.1)
Total Bilirubin: 0.5 mg/dL (ref 0.3–1.2)

## 2017-10-11 LAB — TROPONIN I
TROPONIN I: 0.06 ng/mL — AB (ref <0.03)
TROPONIN I: 0.06 ng/mL — AB (ref <0.03)
Troponin I: 0.04 ng/mL (ref <0.03)

## 2017-10-11 LAB — CBC WITH DIFFERENTIAL/PLATELET
BASOS ABS: 0 10*3/uL (ref 0.0–0.1)
BASOS PCT: 0 %
EOS ABS: 0 10*3/uL (ref 0.0–0.7)
EOS PCT: 0 %
HCT: 33.9 % — ABNORMAL LOW (ref 36.0–46.0)
Hemoglobin: 10.5 g/dL — ABNORMAL LOW (ref 12.0–15.0)
Lymphocytes Relative: 5 %
Lymphs Abs: 0.6 10*3/uL — ABNORMAL LOW (ref 0.7–4.0)
MCH: 29.3 pg (ref 26.0–34.0)
MCHC: 31 g/dL (ref 30.0–36.0)
MCV: 94.7 fL (ref 78.0–100.0)
MONO ABS: 0.5 10*3/uL (ref 0.1–1.0)
MONOS PCT: 4 %
Neutro Abs: 9.6 10*3/uL — ABNORMAL HIGH (ref 1.7–7.7)
Neutrophils Relative %: 91 %
PLATELETS: 191 10*3/uL (ref 150–400)
RBC: 3.58 MIL/uL — ABNORMAL LOW (ref 3.87–5.11)
RDW: 12.8 % (ref 11.5–15.5)
WBC: 10.6 10*3/uL — ABNORMAL HIGH (ref 4.0–10.5)

## 2017-10-11 LAB — PHOSPHORUS
Phosphorus: <1 mg/dL — CL (ref 2.5–4.6)
Phosphorus: 2.4 mg/dL — ABNORMAL LOW (ref 2.5–4.6)

## 2017-10-11 LAB — GLUCOSE, CAPILLARY
GLUCOSE-CAPILLARY: 135 mg/dL — AB (ref 65–99)
GLUCOSE-CAPILLARY: 124 mg/dL — AB (ref 65–99)
GLUCOSE-CAPILLARY: 152 mg/dL — AB (ref 65–99)
Glucose-Capillary: 117 mg/dL — ABNORMAL HIGH (ref 65–99)

## 2017-10-11 LAB — PROCALCITONIN: Procalcitonin: 0.24 ng/mL

## 2017-10-11 LAB — URINE CULTURE: CULTURE: NO GROWTH

## 2017-10-11 LAB — HEPARIN LEVEL (UNFRACTIONATED)
Heparin Unfractionated: 1.42 IU/mL — ABNORMAL HIGH (ref 0.30–0.70)
Heparin Unfractionated: 0.71 IU/mL — ABNORMAL HIGH (ref 0.30–0.70)

## 2017-10-11 LAB — MAGNESIUM
MAGNESIUM: 1.8 mg/dL (ref 1.7–2.4)
Magnesium: 2 mg/dL (ref 1.7–2.4)

## 2017-10-11 MED ORDER — DEXMEDETOMIDINE HCL IN NACL 200 MCG/50ML IV SOLN
0.0000 ug/kg/h | INTRAVENOUS | Status: DC
  Administered 2017-10-11: 1.2 ug/kg/h via INTRAVENOUS
  Administered 2017-10-11: 0.4 ug/kg/h via INTRAVENOUS
  Administered 2017-10-11 (×2): 1.2 ug/kg/h via INTRAVENOUS
  Filled 2017-10-11 (×4): qty 50

## 2017-10-11 MED ORDER — FENTANYL 2500MCG IN NS 250ML (10MCG/ML) PREMIX INFUSION
0.0000 ug/h | INTRAVENOUS | Status: DC
  Administered 2017-10-11: 100 ug/h via INTRAVENOUS
  Administered 2017-10-12 – 2017-10-13 (×3): 200 ug/h via INTRAVENOUS
  Filled 2017-10-11 (×4): qty 250

## 2017-10-11 MED ORDER — DEXMEDETOMIDINE HCL IN NACL 400 MCG/100ML IV SOLN
0.0000 ug/kg/h | INTRAVENOUS | Status: AC
  Administered 2017-10-12 (×4): 1.2 ug/kg/h via INTRAVENOUS
  Administered 2017-10-13: 0.6 ug/kg/h via INTRAVENOUS
  Administered 2017-10-13: 1 ug/kg/h via INTRAVENOUS
  Administered 2017-10-13 – 2017-10-14 (×3): 1.2 ug/kg/h via INTRAVENOUS
  Filled 2017-10-11 (×9): qty 100

## 2017-10-11 MED ORDER — ASPIRIN EC 81 MG PO TBEC: 81.0000 mg | DELAYED_RELEASE_TABLET | Freq: Every day | ORAL | Status: DC

## 2017-10-11 MED ORDER — METOPROLOL TARTRATE 5 MG/5ML IV SOLN
INTRAVENOUS | Status: AC
  Filled 2017-10-11: qty 5

## 2017-10-11 MED ORDER — PRO-STAT SUGAR FREE PO LIQD
30.0000 mL | Freq: Two times a day (BID) | ORAL | Status: DC
  Administered 2017-10-11 – 2017-10-12 (×2): 30 mL
  Filled 2017-10-11 (×2): qty 30

## 2017-10-11 MED ORDER — METHYLPREDNISOLONE SODIUM SUCC 40 MG IJ SOLR
40.0000 mg | Freq: Two times a day (BID) | INTRAMUSCULAR | Status: DC
  Administered 2017-10-11 – 2017-10-13 (×4): 40 mg via INTRAVENOUS
  Filled 2017-10-11 (×4): qty 1

## 2017-10-11 MED ORDER — SODIUM PHOSPHATES 45 MMOLE/15ML IV SOLN
10.0000 mmol | Freq: Once | INTRAVENOUS | Status: AC
  Administered 2017-10-11: 10 mmol via INTRAVENOUS
  Filled 2017-10-11: qty 3.33

## 2017-10-11 MED ORDER — SODIUM CHLORIDE 0.9 % IV SOLN
INTRAVENOUS | Status: DC
  Administered 2017-10-11 – 2017-10-15 (×7): via INTRAVENOUS

## 2017-10-11 MED ORDER — POTASSIUM & SODIUM PHOSPHATES 280-160-250 MG PO PACK
1.0000 | PACK | Freq: Three times a day (TID) | ORAL | Status: AC
  Administered 2017-10-11 – 2017-10-13 (×6): 1 via ORAL
  Filled 2017-10-11 (×7): qty 1

## 2017-10-11 MED ORDER — LEVOFLOXACIN IN D5W 750 MG/150ML IV SOLN
750.0000 mg | INTRAVENOUS | Status: DC
  Administered 2017-10-12: 750 mg via INTRAVENOUS
  Filled 2017-10-11 (×4): qty 150

## 2017-10-11 MED ORDER — HEPARIN (PORCINE) IN NACL 100-0.45 UNIT/ML-% IJ SOLN
650.0000 [IU]/h | INTRAMUSCULAR | Status: DC
  Administered 2017-10-12: 650 [IU]/h via INTRAVENOUS
  Filled 2017-10-11: qty 250

## 2017-10-11 MED ORDER — SODIUM CHLORIDE 0.9 % IV BOLUS (SEPSIS)
500.0000 mL | Freq: Once | INTRAVENOUS | Status: AC
  Administered 2017-10-11: 500 mL via INTRAVENOUS

## 2017-10-11 MED ORDER — METOPROLOL TARTRATE 5 MG/5ML IV SOLN
2.5000 mg | INTRAVENOUS | Status: DC | PRN
  Administered 2017-10-11 – 2017-10-14 (×13): 2.5 mg via INTRAVENOUS
  Filled 2017-10-11 (×16): qty 5

## 2017-10-11 MED ORDER — VITAL HIGH PROTEIN PO LIQD
1000.0000 mL | ORAL | Status: DC
  Administered 2017-10-11: 1000 mL

## 2017-10-11 MED ORDER — ASPIRIN 81 MG PO CHEW
81.0000 mg | CHEWABLE_TABLET | Freq: Every day | ORAL | Status: DC
  Administered 2017-10-11 – 2017-10-14 (×4): 81 mg
  Filled 2017-10-11 (×5): qty 1

## 2017-10-11 MED ORDER — FENTANYL CITRATE (PF) 100 MCG/2ML IJ SOLN: 50.0000 ug | INTRAMUSCULAR | Status: DC | PRN

## 2017-10-11 MED ORDER — FENTANYL CITRATE (PF) 100 MCG/2ML IJ SOLN
50.0000 ug | INTRAMUSCULAR | Status: DC | PRN
  Administered 2017-10-11 (×2): 50 ug via INTRAVENOUS

## 2017-10-11 NOTE — Progress Notes (Signed)
ANTICOAGULATION CONSULT NOTE - Follow Up Consult  Pharmacy Consult for heparin Indication: r/o PE  Labs: Recent Labs    10/10/17 1011 10/10/17 1841 10/11/17 0423  HGB 10.4*  --  10.5*  HCT 35.5*  --  33.9*  PLT 199  --  191  LABPROT 12.7  --   --   INR 0.96  --   --   HEPARINUNFRC  --   --  1.42*  CREATININE 1.87*  --  1.39*  TROPONINI  --  <0.03 0.04*    Assessment: 76yo female above goal on heparin with initial dosing for possible PE.  Goal of Therapy:  Heparin level 0.3-0.7 units/ml   Plan:  Will hold heparin gtt x1hr then resume heparin gtt at lower rate of 700 units/hr and check level in [place], PharmD, BCPS  10/11/2017,6:58 AM

## 2017-10-11 NOTE — Progress Notes (Addendum)
[facility] for Heparin Indication: r/o PE  Allergies  Allergen Reactions  . Contrast Media [Iodinated Diagnostic Agents]     LOW KIDNEY FUNCTION///NEED TO CHECK WITH NEPHROLOGIST BEFORE PROCEDURE.  . Biaxin [Clarithromycin] Hives  . Penicillins Hives  . Sulfa Antibiotics Rash    Patient Measurements: Height: 5\' 5"  (165.1 cm) Weight: 128 lb 8.5 oz (58.3 kg) IBW/kg (Calculated) : 57 Heparin Dosing Weight: 58 kg  Vital Signs: Temp: 98.3 F (36.8 C) (11/04 0748) Temp Source: Axillary (11/04 0748) BP: 132/120 (11/04 1635) Pulse Rate: 132 (11/04 1635)  Labs: Recent Labs    10/10/17 1011  10/11/17 0423 10/11/17 1155 10/11/17 1642  HGB 10.4*  --  10.5*  --   --   HCT 35.5*  --  33.9*  --   --   PLT 199  --  191  --   --   LABPROT 12.7  --   --   --   --   INR 0.96  --   --   --   --   HEPARINUNFRC  --   --  1.42*  --  0.71*  CREATININE 1.87*  --  1.39*  --   --   TROPONINI  --    < > 0.04* 0.06* 0.06*   < > = values in this interval not displayed.    Estimated Creatinine Clearance: 30.5 mL/min (A) (by C-G formula based on SCr of 1.39 mg/dL (H)).  Assessment: Patient admitted with SOB after being found down- intubated in the ED. Ddimer elevated 1.93 and started in IV heparin.  Levels have been elevated- this evening's just above goal at 0.71 units/mL.  No bleeding noted. Hgb 10.5, Plts 191- overall stable since admission. Noted dopplers negative for DVT.  Goal of Therapy:  Heparin level 0.3-0.7 units/ml Monitor platelets by anticoagulation protocol: Yes   Plan:  Reduce Heparin infusion to 650 units/hr Heparin level in 8 hours Daily Heparin level and CBC Follow plans for continued anticoagulation and evaluation for PE  Theodore Rahrig D. Destinie Thornsberry, PharmD, BCPS Clinical Pharmacist [phone] 10/11/2017 6:11 PM

## 2017-10-11 NOTE — Progress Notes (Signed)
PULMONARY / CRITICAL CARE MEDICINE   Name: Carol Carson MRN: [id number] DOB: September 26, 1941    ADMISSION DATE:  10/10/2017 CONSULTATION DATE: October 10, 2017  REFERRING MD: Dr. Alvino Chapel  CHIEF COMPLAINT: Found down, shortness of breath  HISTORY OF PRESENT ILLNESS:   76 year old female with a past medical history significant for COPD was intubated in the [facility] emergency department on October 10, 2017 for hypercapnic respiratory failure.  She had been found down prior.    SUBJECTIVE:  Elevated lactic acid overnight, given fluids Remains on mechanical ventilator Tachycardic Has followed commands since admission  VITAL SIGNS: BP 106/65   Pulse (!) 119   Temp 98.3 F (36.8 C) (Axillary)   Resp (!) 26   Ht 5\' 5"  (1.651 m)   Wt 58.3 kg (128 lb 8.5 oz)   SpO2 100%   BMI 21.39 kg/m   HEMODYNAMICS:    VENTILATOR SETTINGS: Vent Mode: PRVC FiO2 (%):  [40 %] 40 % Set Rate:  [22 bmp-26 bmp] 26 bmp Vt Set:  [410 mL-450 mL] 450 mL PEEP:  [5 cmH20] 5 cmH20 Plateau Pressure:  [21 cmH20-30 cmH20] 23 cmH20  INTAKE / OUTPUT: I/O last 3 completed shifts: In: 4667.7 [I.V.:3417.7; IV Piggyback:1250] Out: 750 [Urine:750]  PHYSICAL EXAMINATION:  General:  In bed on vent HENT: NCAT ETT in place PULM: Crackles left lung today B, vent supported breathing CV: tachy, regular, no mgr GI: BS+, soft, nontender MSK: normal bulk and tone Neuro: sedated on vent    LABS:  BMET Recent Labs  Lab 10/10/17 1011 10/11/17 0423  NA 138 142  K 5.1 3.4*  CL 98* 104  CO2 32 28  BUN 25* 18  CREATININE 1.87* 1.39*  GLUCOSE 190* 148*    Electrolytes Recent Labs  Lab 10/10/17 1011 10/11/17 0423  CALCIUM 8.2* 7.4*  MG  --  2.0  PHOS  --  <1.0*    CBC Recent Labs  Lab 10/10/17 1011 10/11/17 0423  WBC 7.8 10.6*  HGB 10.4* 10.5*  HCT 35.5* 33.9*  PLT 199 191    Coag's Recent Labs  Lab 10/10/17 1011  INR 0.96    Sepsis Markers Recent Labs  Lab 10/10/17 1018  10/10/17 1638 10/10/17 1841 10/11/17 0423  LATICACIDVEN 0.71 3.6* 4.1*  --   PROCALCITON  --  <0.10  --  0.24    ABG Recent Labs  Lab 10/10/17 1044 10/10/17 1621 10/10/17 2135  PHART 7.200* 7.197* 7.447  PCO2ART 86.2* 61.4* 40.2  PO2ART 248.0* 64.0* 102.0    Liver Enzymes Recent Labs  Lab 10/10/17 1011 10/11/17 0423  AST 17 26  ALT 6* 9*  ALKPHOS 42 37*  BILITOT 0.4 0.5  ALBUMIN 3.1* 2.7*    Cardiac Enzymes Recent Labs  Lab 10/10/17 1841 10/11/17 0423  TROPONINI <0.03 0.04*    Glucose Recent Labs  Lab 10/10/17 1742 10/10/17 2329 10/11/17 0523  GLUCAP 185* 170* 135*    Imaging Ct Head Wo Contrast  Result Date: 10/10/2017 CLINICAL DATA:  Unresponsive EXAM: CT HEAD WITHOUT CONTRAST TECHNIQUE: Contiguous axial images were obtained from the base of the skull through the vertex without intravenous contrast. COMPARISON:  None. FINDINGS: Brain: There is low-density within the anterior limb of the left internal capsule. There is also low-density in the anterior external capsule. The insular cortex is intact. There is no obvious mass effect or hemorrhage. Nonspecific tiny calcification is present in the right occipital cortex. Chronic ischemic changes in the periventricular white matter are superimposed. Vascular:  No hyperdense vessel or unexpected calcification. Skull: Cranium is intact. Sinuses/Orbits: There is mucosal thickening in the anterior ethmoid air cells. Maxillary sinus, sphenoid sinus, and mastoid air cells are clear. Frontal sinuses are clear. Orbits are within normal limits for age. Other: Noncontributory. IMPRESSION: There is low-density within the left internal and external capsule as described. Age indeterminate infarcts are suggested. Please note that the insular cortex is within normal limits and there is no evidence of hyperdense MCA. MRI may be helpful to further characterize. Superimpose chronic ischemic changes are noted. Electronically Signed   By:  Marybelle Killings M.D.   On: 10/10/2017 11:26   Dg Chest Portable 1 View  Result Date: 10/10/2017 CLINICAL DATA:  76 year old female with a history of intubation EXAM: PORTABLE CHEST 1 VIEW COMPARISON:  09/15/2016, 06/28/2015 FINDINGS: Cardiomediastinal silhouette unchanged in size and contour. No evidence of central vascular congestion. Diffuse coarsening of interstitial markings, similar to comparison chest x-rays. No new confluent airspace disease. Architectural distortion at the left lung base, similar to prior. No large pleural effusion. Hyperexpansion with relative flattening of the hemidiaphragms, compatible with emphysema. Calcified nodule the left upper chest is again noted, potentially involving the rib. Interval placement of endotracheal tube, which terminates suitably above the carina, approximately 5.6 cm. Interval placement of gastric tube, terminating out of the field of view. IMPRESSION: Chronic lung changes and emphysema without evidence of superimposed acute cardiopulmonary disease. Interval placement of endotracheal tube which terminates suitably above the carina. Interval placement gastric tube terminating Electronically Signed   By: Corrie Mckusick D.O.   On: 10/10/2017 10:35   Dg Abd Portable 1 View  Result Date: 10/10/2017 CLINICAL DATA:  76 year old female with recent intubation. EXAM: PORTABLE ABDOMEN - 1 VIEW COMPARISON:  None. FINDINGS: Limited plain film of the upper abdomen demonstrates gastric tube with the side port terminating in the stomach lumen and the tip out of the field of view. Gas within stomach. IMPRESSION: Limited plain film of the upper abdomen demonstrates gastric tube terminating in the stomach, although the tip projects beyond the field of view of the study. Electronically Signed   By: Corrie Mckusick D.O.   On: 10/10/2017 10:36     STUDIES:  11/3 CT head :  There is low-density within the left internal and external capsule as described. Age indeterminate infarcts  are suggested. Please note that the insular cortex is within normal limits and there is no evidence of hyperdense MCA. MRI may be helpful to further characterize.  CULTURES: 11/3 resp culture >  11/3 blood culture >  11/3 urine culture >   ANTIBIOTICS: 11/3 cipro/doxy > 11/4 11/4 levaquin >   SIGNIFICANT EVENTS:   LINES/TUBES: 11/3 ETT >   DISCUSSION: 76 year old female with COPD found down on October 10, 2017 in the setting of acute hypercapnic respiratory failure.  Based on physical exam findings and chest x-ray appears to be developing community-acquired pneumonia in the left upper lobe.  Mental status has improved as of November 4 but remains critically ill.  ASSESSMENT / PLAN:  PULMONARY A: Acute respiratory failure with hypercapnia COPD, acute exacerbation Community-acquired pneumonia P:   Full ventilatory support Ventilator associated pneumonia protocol Monitor O2 saturation Continue DuoNeb scheduled Decrease dose of Solu-Medrol today  CARDIOVASCULAR A:  Sinus tachycardia Elevated d-dimer Demand ischemia P:  Telemetry monitoring Continue heparin Follow-up echocardiogram Lower extremity Dopplers today Continue metoprolol Add aspirin  RENAL A:   Acute kidney injury, improving P:   Continue IV fluids, change LR to  saline Monitor BMET and UOP Replace electrolytes as needed   GASTROINTESTINAL A:   No acute issues P:   Continue Pepcid for stress ulcer prophylaxis  HEMATOLOGIC A:   Elevated d-dimer, question venous thromboembolism P:  Continue heparin Consider CT angiogram chest Lower extremity Doppler Echocardiogram  INFECTIOUS A:   Community acquired pneumonia left upper lobe P:   Follow-up respiratory cultures Change doxycycline to Levaquin in setting of penicillin allergy  ENDOCRINE A:   No acute issues P:   Monitor glucose  NEUROLOGIC A:   Acute encephalopathy secondary to hypercarbia Nonspecific low-density lesion in  internal capsule P:   RASS goal: 0--1 Continue propofol per PAD protocol Will need MRI brain at some point   FAMILY  - Updates: I attempted to call her son several times this morning for an update, I had to leave a message  - Inter-disciplinary family meet or Palliative Care meeting due by:  day 7  My critical care time 35 minutes  Roselie Awkward, MD [place] [facility] Pager: [phone] Cell: [phone] After 3pm or if no response, call [phone]    10/11/2017, 8:47 AM

## 2017-10-11 NOTE — Progress Notes (Signed)
[facility] Progress Note Patient Name: Carol Carson DOB: 01-May-1941 MRN: [id number]   Date of Service  10/11/2017  HPI/Events of Note  Troponin = 0.04 - Likely demand ischemia. HR now better controlled with B-Blocker. Already on a Heparin IV infusion.   eICU Interventions  Will order: 1. Continue to Trend Troponin.      Intervention Category Intermediate Interventions: Diagnostic test evaluation  Sommer,Steven Eugene 10/11/2017, 6:50 AM

## 2017-10-11 NOTE — Progress Notes (Signed)
Bilateral lower extremity venous duplex has been completed. Negative for DVT.  10/11/17 10:14 AM Carol Carson RVT

## 2017-10-11 NOTE — Progress Notes (Signed)
At 1635 pt was awake/alert and communicating via mouthing around the tube.  Pt placed on SBT. At 1710 I came to room d/t vent alarming.  Pt sitting strait up in bed and very agitated, RN giving sedation to calm pt.  Pt tachypnic RR 50's.  Pt placed back on full vent support. RN at bedside and aware.

## 2017-10-11 NOTE — Progress Notes (Signed)
CRITICAL VALUE ALERT  Critical Value:  Phosphorous <1 and Troponin 0.06  Date & Time Notied:  2763  Provider Notified: Dr. Lake Bells  Orders Received/Actions taken: Call back

## 2017-10-11 NOTE — CV Procedure (Signed)
2D echo attempted but IV team placing a central line. Will try later

## 2017-10-12 ENCOUNTER — Inpatient Hospital Stay (HOSPITAL_COMMUNITY): Payer: Medicare Other

## 2017-10-12 ENCOUNTER — Other Ambulatory Visit: Payer: Self-pay

## 2017-10-12 ENCOUNTER — Encounter (HOSPITAL_COMMUNITY): Payer: Self-pay | Admitting: Cardiology

## 2017-10-12 DIAGNOSIS — J438 Other emphysema: Secondary | ICD-10-CM

## 2017-10-12 DIAGNOSIS — I471 Supraventricular tachycardia: Secondary | ICD-10-CM

## 2017-10-12 DIAGNOSIS — G9340 Encephalopathy, unspecified: Secondary | ICD-10-CM

## 2017-10-12 DIAGNOSIS — R0602 Shortness of breath: Secondary | ICD-10-CM

## 2017-10-12 LAB — GLUCOSE, CAPILLARY
GLUCOSE-CAPILLARY: 125 mg/dL — AB (ref 65–99)
Glucose-Capillary: 110 mg/dL — ABNORMAL HIGH (ref 65–99)
Glucose-Capillary: 115 mg/dL — ABNORMAL HIGH (ref 65–99)
Glucose-Capillary: 117 mg/dL — ABNORMAL HIGH (ref 65–99)
Glucose-Capillary: 141 mg/dL — ABNORMAL HIGH (ref 65–99)
Glucose-Capillary: 157 mg/dL — ABNORMAL HIGH (ref 65–99)
Glucose-Capillary: 213 mg/dL — ABNORMAL HIGH (ref 65–99)

## 2017-10-12 LAB — CBC WITH DIFFERENTIAL/PLATELET
BASOS PCT: 0 %
Basophils Absolute: 0 10*3/uL (ref 0.0–0.1)
EOS PCT: 0 %
Eosinophils Absolute: 0 10*3/uL (ref 0.0–0.7)
HEMATOCRIT: 32.1 % — AB (ref 36.0–46.0)
HEMOGLOBIN: 10 g/dL — AB (ref 12.0–15.0)
LYMPHS PCT: 3 %
Lymphs Abs: 0.4 10*3/uL — ABNORMAL LOW (ref 0.7–4.0)
MCH: 29.2 pg (ref 26.0–34.0)
MCHC: 31.2 g/dL (ref 30.0–36.0)
MCV: 93.6 fL (ref 78.0–100.0)
MONOS PCT: 6 %
Monocytes Absolute: 0.8 10*3/uL (ref 0.1–1.0)
NEUTROS ABS: 12.5 10*3/uL — AB (ref 1.7–7.7)
Neutrophils Relative %: 91 %
Platelets: 145 10*3/uL — ABNORMAL LOW (ref 150–400)
RBC: 3.43 MIL/uL — ABNORMAL LOW (ref 3.87–5.11)
RDW: 13.5 % (ref 11.5–15.5)
WBC: 13.7 10*3/uL — ABNORMAL HIGH (ref 4.0–10.5)

## 2017-10-12 LAB — ECHOCARDIOGRAM COMPLETE
AOASC: 26 cm
EWDT: 108 ms
FS: 28 % (ref 28–44)
HEIGHTINCHES: 65 in
IVS/LV PW RATIO, ED: 1.2
LA diam end sys: 32 mm
LA diam index: 1.89 cm/m2
LA vol index: 10.8 mL/m2
LASIZE: 32 mm
LAVOL: 18.3 mL
LAVOLA4C: 18.3 mL
LV PW d: 8.07 mm — AB (ref 0.6–1.1)
MV Dec: 108
MV pk E vel: 117 m/s
MVPG: 5 mmHg
Reg peak vel: 284 cm/s
TRMAXVEL: 284 cm/s
Weight: 2186.96 oz

## 2017-10-12 LAB — BASIC METABOLIC PANEL
ANION GAP: 9 (ref 5–15)
BUN: 19 mg/dL (ref 6–20)
CALCIUM: 7 mg/dL — AB (ref 8.9–10.3)
CO2: 27 mmol/L (ref 22–32)
Chloride: 104 mmol/L (ref 101–111)
Creatinine, Ser: 1.29 mg/dL — ABNORMAL HIGH (ref 0.44–1.00)
GFR calc Af Amer: 45 mL/min — ABNORMAL LOW (ref >60)
GFR, EST NON AFRICAN AMERICAN: 39 mL/min — AB (ref >60)
GLUCOSE: 175 mg/dL — AB (ref 65–99)
Potassium: 3.2 mmol/L — ABNORMAL LOW (ref 3.5–5.1)
Sodium: 140 mmol/L (ref 135–145)

## 2017-10-12 LAB — HEPARIN LEVEL (UNFRACTIONATED)
HEPARIN UNFRACTIONATED: 1 [IU]/mL — AB (ref 0.30–0.70)
Heparin Unfractionated: 0.25 [IU]/mL — ABNORMAL LOW (ref 0.30–0.70)

## 2017-10-12 LAB — PHOSPHORUS
PHOSPHORUS: 2.7 mg/dL (ref 2.5–4.6)
Phosphorus: 3.7 mg/dL (ref 2.5–4.6)

## 2017-10-12 LAB — TROPONIN I: TROPONIN I: 0.07 ng/mL — AB (ref <0.03)

## 2017-10-12 LAB — PROCALCITONIN: PROCALCITONIN: 0.17 ng/mL

## 2017-10-12 LAB — MAGNESIUM
MAGNESIUM: 1.4 mg/dL — AB (ref 1.7–2.4)
Magnesium: 1.7 mg/dL (ref 1.7–2.4)

## 2017-10-12 MED ORDER — HEPARIN (PORCINE) IN NACL 100-0.45 UNIT/ML-% IJ SOLN
700.0000 [IU]/h | INTRAMUSCULAR | Status: DC
  Administered 2017-10-15 (×2): 700 [IU]/h via INTRAVENOUS
  Filled 2017-10-12 (×4): qty 250

## 2017-10-12 MED ORDER — MAGNESIUM SULFATE 2 GM/50ML IV SOLN
2.0000 g | Freq: Once | INTRAVENOUS | Status: AC
  Administered 2017-10-12: 2 g via INTRAVENOUS
  Filled 2017-10-12: qty 50

## 2017-10-12 MED ORDER — METOPROLOL TARTRATE 5 MG/5ML IV SOLN
2.5000 mg | Freq: Once | INTRAVENOUS | Status: AC
  Administered 2017-10-12: 2.5 mg via INTRAVENOUS

## 2017-10-12 MED ORDER — PERFLUTREN LIPID MICROSPHERE
1.0000 mL | INTRAVENOUS | Status: AC | PRN
  Filled 2017-10-12: qty 10

## 2017-10-12 MED ORDER — METOPROLOL TARTRATE 5 MG/5ML IV SOLN
5.0000 mg | Freq: Once | INTRAVENOUS | Status: AC
  Administered 2017-10-12: 5 mg via INTRAVENOUS

## 2017-10-12 MED ORDER — FLECAINIDE ACETATE 50 MG PO TABS: 50.0000 mg | ORAL_TABLET | Freq: Two times a day (BID) | ORAL | Status: DC

## 2017-10-12 MED ORDER — VITAL AF 1.2 CAL PO LIQD
1000.0000 mL | ORAL | Status: DC
  Administered 2017-10-12 – 2017-10-13 (×3): 1000 mL

## 2017-10-12 MED ORDER — METOPROLOL TARTRATE 25 MG/10 ML ORAL SUSPENSION
50.0000 mg | Freq: Two times a day (BID) | ORAL | Status: DC
  Administered 2017-10-12 (×2): 50 mg
  Filled 2017-10-12 (×3): qty 20

## 2017-10-12 MED ORDER — PERFLUTREN LIPID MICROSPHERE
INTRAVENOUS | Status: AC
Start: 2017-10-12 — End: 2017-10-12
  Filled 2017-10-12: qty 10

## 2017-10-12 MED ORDER — INSULIN ASPART 100 UNIT/ML ~~LOC~~ SOLN
0.0000 [IU] | SUBCUTANEOUS | Status: DC
Start: 2017-10-12 — End: 2017-10-15
  Administered 2017-10-12: 1 [IU] via SUBCUTANEOUS
  Administered 2017-10-12: 3 [IU] via SUBCUTANEOUS
  Administered 2017-10-12: 1 [IU] via SUBCUTANEOUS
  Administered 2017-10-13 (×2): 2 [IU] via SUBCUTANEOUS
  Administered 2017-10-13: 1 [IU] via SUBCUTANEOUS
  Administered 2017-10-13 (×2): 2 [IU] via SUBCUTANEOUS
  Administered 2017-10-13: 1 [IU] via SUBCUTANEOUS
  Administered 2017-10-13: 2 [IU] via SUBCUTANEOUS
  Administered 2017-10-14: 1 [IU] via SUBCUTANEOUS
  Administered 2017-10-14: 2 [IU] via SUBCUTANEOUS
  Administered 2017-10-14 – 2017-10-15 (×3): 1 [IU] via SUBCUTANEOUS

## 2017-10-12 MED ORDER — PERFLUTREN LIPID MICROSPHERE
4.0000 mL | INTRAVENOUS | Status: AC | PRN
  Administered 2017-10-12: 4 mL via INTRAVENOUS
  Filled 2017-10-12: qty 4

## 2017-10-12 NOTE — Progress Notes (Signed)
PULMONARY / CRITICAL CARE MEDICINE   Name: Carol Carson MRN: [id number] DOB: 1941-11-18    ADMISSION DATE:  10/10/2017 CONSULTATION DATE: October 10, 2017  REFERRING MD: Dr. Alvino Chapel  CHIEF COMPLAINT: Found down, shortness of breath  HISTORY OF PRESENT ILLNESS:   76 year old female with a past medical history significant for COPD who was intubated in the [facility] emergency department on October 10, 2017 for hypercapnic respiratory failure.  She was initially hypothermic but subsequently had a temperature and an elevated lactate.  Is been extensively volume loaded and empirically was started on Levaquin in addition to systemic steroids and bronchodilators.  He has remained ventilator dependent and failed an attempted SBT this morning.  She is very anxious but does not appropriately to questions.    SUBJECTIVE:   VITAL SIGNS: BP 137/68   Pulse (!) 135   Temp 98.8 F (37.1 C)   Resp (!) 26   Ht 5\' 5"  (1.651 m)   Wt 136 lb 11 oz (62 kg)   SpO2 99%   BMI 22.75 kg/m   HEMODYNAMICS:    VENTILATOR SETTINGS: Vent Mode: PRVC FiO2 (%):  [40 %] 40 % Set Rate:  [26 bmp] 26 bmp Vt Set:  [450 mL] 450 mL PEEP:  [5 cmH20] 5 cmH20 Pressure Support:  [5 cmH20] 5 cmH20 Plateau Pressure:  [17 cmH20-22 cmH20] 21 cmH20  INTAKE / OUTPUT: I/O last 3 completed shifts: In: 7301 [I.V.:6015; NG/GT:482.7; IV Piggyback:803.3] Out: 2678 [Urine:2675; Emesis/NG output:3]  PHYSICAL EXAMINATION:  General: Elderly white female who is orally intubated and mechanically ventilated  PULM: Is not breathing above the set ventilator rate when at rest.  There is symmetric air movement scattered rhonchi no wheezes  CV: tachy, regular, no mgr GI: BS+, soft, nontender MSK: normal bulk and tone     LABS:  BMET Recent Labs  Lab 10/10/17 1011 10/11/17 0423 10/12/17 0233  NA 138 142 140  K 5.1 3.4* 3.2*  CL 98* 104 104  CO2 32 28 27  BUN 25* 18 19  CREATININE 1.87* 1.39* 1.29*  GLUCOSE  190* 148* 175*    Electrolytes Recent Labs  Lab 10/10/17 1011 10/11/17 0423 10/11/17 1852 10/12/17 0233  CALCIUM 8.2* 7.4*  --  7.0*  MG  --  2.0 1.8 1.7  PHOS  --  <1.0* 2.4* 2.7    CBC Recent Labs  Lab 10/10/17 1011 10/11/17 0423 10/12/17 0233  WBC 7.8 10.6* 13.7*  HGB 10.4* 10.5* 10.0*  HCT 35.5* 33.9* 32.1*  PLT 199 191 145*    Coag's Recent Labs  Lab 10/10/17 1011  INR 0.96    Sepsis Markers Recent Labs  Lab 10/10/17 1018 10/10/17 1638 10/10/17 1841 10/11/17 0423 10/12/17 0233  LATICACIDVEN 0.71 3.6* 4.1*  --   --   PROCALCITON  --  <0.10  --  0.24 0.17    ABG Recent Labs  Lab 10/10/17 1044 10/10/17 1621 10/10/17 2135  PHART 7.200* 7.197* 7.447  PCO2ART 86.2* 61.4* 40.2  PO2ART 248.0* 64.0* 102.0    Liver Enzymes Recent Labs  Lab 10/10/17 1011 10/11/17 0423  AST 17 26  ALT 6* 9*  ALKPHOS 42 37*  BILITOT 0.4 0.5  ALBUMIN 3.1* 2.7*    Cardiac Enzymes Recent Labs  Lab 10/11/17 1155 10/11/17 1642 10/12/17 0014  TROPONINI 0.06* 0.06* 0.07*    Glucose Recent Labs  Lab 10/11/17 1144 10/11/17 1633 10/11/17 2047 10/12/17 0023 10/12/17 0443 10/12/17 0803  GLUCAP 117* 124* 152* 213* 125* 110*  Imaging Dg Chest Port 1 View  Result Date: 10/12/2017 CLINICAL DATA:  Thoracic aortic atherosclerosis. EXAM: PORTABLE CHEST 1 VIEW COMPARISON:  Chest x-ray of October 10, 2017 FINDINGS: The lungs remain hyperinflated. There is a trace of blunting of the left lateral costophrenic angle. There is a stable 10 x 13 mm radiodensity peripherally in the left upper lobe which appears calcified. The heart and pulmonary vascularity are normal. There is calcification in the wall of the aortic arch. The endotracheal tube tip projects 5.9 cm above the carina. The esophagogastric tube tip in proximal port project below the GE junction. IMPRESSION: No acute pneumonia nor CHF. Chronic hyperinflation and interstitial prominence consistent with COPD.  Possible trace left pleural effusion. Evidence of previous granulomatous disease. The support tubes are in reasonable position. Electronically Signed   By: David  Martinique M.D.   On: 10/12/2017 08:09     STUDIES:  11/3 CT head :  There is low-density within the left internal and external capsule as described. Age indeterminate infarcts are suggested. Please note that the insular cortex is within normal limits and there is no evidence of hyperdense MCA. MRI may be helpful to further characterize.  CULTURES: 11/3 resp culture >  11/3 blood culture >  11/3 urine culture >   ANTIBIOTICS: 11/3 cipro/doxy > 11/4 11/4 levaquin >   SIGNIFICANT EVENTS:   LINES/TUBES: 11/3 ETT >   DISCUSSION: 76 year old female with COPD found down on October 10, 2017 in the setting of acute hypercapnic respiratory failure.  Acute precipitant is not overt.   ASSESSMENT / PLAN:  PULMONARY A: Acute respiratory failure with hypercapnia COPD, acute exacerbation Community-acquired pneumonia P:   CT scan of the chest is pending today to define whether or not she does not fact have a infiltrate.  There is also a concern that she may have a nodule in the left upper lobe on the plain chest film, that too be evaluated with the exam.  Easy to oxygenate and her respiratory mechanics look reasonably good however she did not tolerate a spontaneous breathing trial at all today.  Heart this may be secondary to her anxiety, and I have encouraged the use of benzodiazepines. CARDIOVASCULAR A:  Sinus tachycardia Elevated d-dimer Demand ischemia P:  Telemetry monitoring Continue heparin today Follow-up echocardiogram Lower extremity Dopplers negative Continue metoprolol Add aspirin  RENAL A:   Acute kidney injury, improving P:   Continue IV fluids, change LR to saline Monitor BMET and UOP Replace electrolytes as needed   GASTROINTESTINAL A:   No acute issues P:   Continue Pepcid for stress ulcer  prophylaxis  HEMATOLOGIC A:   Elevated d-dimer, question venous thromboembolism P:  Continue heparin Dopplers of LE's negative. CTA of chest only if non-contrast CT does not show an explanation for wide aA gradient.  INFECTIOUS A:   Possible Community acquired pneumonia left upper lobe P:   CT chest pending today Follow-up respiratory cultures Change doxycycline to Levaquin in setting of penicillin allergy  ENDOCRINE A:   No acute issues P:   Monitor glucose  NEUROLOGIC A:   Acute encephalopathy secondary to hypercarbia Nonspecific low-density lesion in internal capsule P:   RASS goal: 0--1 Continue propofol per PAD protocol Will need MRI brain at some point   FAMILY  Situation discussed with family this am  My critical care time 35 minutes  Lars Masson, MD [place] [facility] Pager: [phone] Cell: [phone] After 3pm or if no response, call [phone]    10/12/2017, 10:15 AM

## 2017-10-12 NOTE — Progress Notes (Signed)
Nutrition Follow-up  INTERVENTION:   Vital AF 1.2@ 45 ml/hr (1080 ml/day) Provides: 1296 kcal, 94 grams protein, and 875 ml free water.   D/C Vital High Protein and Prostat  NUTRITION DIAGNOSIS:   Inadequate oral intake related to inability to eat as evidenced by NPO status. Ongoing.   GOAL:   Provide needs based on ASPEN/SCCM guidelines Progressing.   MONITOR:   I & O's, Labs, TF tolerance, Vent status, Skin  REASON FOR ASSESSMENT:   Consult, Ventilator Enteral/tube feeding initiation and management  ASSESSMENT:   Carol Carson has a PMH of O2 Dependent COPD, HLD, HTN, Endometrial and Uterine cancer, presents unresponsive with air trapping and CO2 retention, now intubated  Pt discussed during ICU rounds and with RN.  High anxiety per RN.   Patient is currently intubated on ventilator support MV: 11.4 L/min - has remained elevated, temp 37 Temp (24hrs), Avg:98.1 F (36.7 C), Min:94.5 F (34.7 C), Max:101.2 F (38.4 C)  Propofol: off now on precedex  Medications reviewed and include: solumedrol, K+phos Labs reviewed: K+ 3.2 (L) CBG's: 125-110-141   TF started 11/4: Vital High Protein @ 40 ml/hr with 30 ml Prostat BID  Diet Order:  Diet NPO time specified  EDUCATION NEEDS:   Not appropriate for education at this time  Skin:  Skin Assessment: Reviewed RN Assessment  Last BM:  unknown  Height:   Ht Readings from Last 1 Encounters:  10/10/17 5\' 5"  (1.651 m)    Weight:   Wt Readings from Last 1 Encounters:  10/12/17 136 lb 11 oz (62 kg)    Ideal Body Weight:  56.81 kg  BMI:  Body mass index is 22.75 kg/m.  Estimated Nutritional Needs:   Kcal:  1321  Protein:  83-94 grams (1.5-1.7g/kg)  Fluid:  >1.5L  Maylon Peppers RD, [place], [place] Pager [phone] After Hours Pager

## 2017-10-12 NOTE — Progress Notes (Signed)
[place] Progress Note Patient Name: Carol Carson DOB: Jun 07, 1941 MRN: [id number]   Date of Service  10/12/2017  HPI/Events of Note  Multiple issues: 1. Blood glucose = 213 and 2. Hypothermia.   eICU Interventions  Will order: 1. Q 4 hour sensitive Novolog SSI.  2. [employer].      Intervention Category Major Interventions: Hyperglycemia - active titration of insulin therapy;Other:  Lysle Dingwall 10/12/2017, 12:39 AM

## 2017-10-12 NOTE — Progress Notes (Signed)
[place] for Heparin Indication: r/o PE  Allergies  Allergen Reactions  . Contrast Media [Iodinated Diagnostic Agents] Other (See Comments)    LOW KIDNEY FUNCTION///NEED TO CHECK WITH NEPHROLOGIST BEFORE PROCEDURE.  . Biaxin [Clarithromycin] Hives  . Penicillins Hives    Has patient had a PCN reaction causing immediate rash, facial/tongue/throat swelling, SOB or lightheadedness with hypotension: Yes Has patient had a PCN reaction causing severe rash involving mucus membranes or skin necrosis: No Has patient had a PCN reaction that required hospitalization: No Has patient had a PCN reaction occurring within the last 10 years: No If all of the above answers are "NO", then may proceed with Cephalosporin use.  . Sulfa Antibiotics Rash   Patient Measurements: Height: 5\' 5"  (165.1 cm) Weight: 136 lb 11 oz (62 kg) IBW/kg (Calculated) : 57 Heparin Dosing Weight: 58 kg  Vital Signs: Temp: 98.2 F (36.8 C) (11/05 1200) Temp Source: Axillary (11/05 1200) BP: 127/87 (11/05 1300) Pulse Rate: 132 (11/05 1300)  Labs: Recent Labs    10/10/17 1011  10/11/17 0423 10/11/17 1155 10/11/17 1642 10/12/17 0014 10/12/17 0233  HGB 10.4*  --  10.5*  --   --   --  10.0*  HCT 35.5*  --  33.9*  --   --   --  32.1*  PLT 199  --  191  --   --   --  145*  LABPROT 12.7  --   --   --   --   --   --   INR 0.96  --   --   --   --   --   --   HEPARINUNFRC  --   --  1.42*  --  0.71*  --  1.00*  CREATININE 1.87*  --  1.39*  --   --   --  1.29*  TROPONINI  --    < > 0.04* 0.06* 0.06* 0.07*  --    < > = values in this interval not displayed.    Estimated Creatinine Clearance: 32.9 mL/min (A) (by C-G formula based on SCr of 1.29 mg/dL (H)).  Assessment:  Patient admitted with SOB after being found down- intubated in the ED. Ddimer elevated 1.93 and started in IV heparin for r/o PE. Noted bilateral LE dopplers negative for DVT.  HL this afternoon is now  sub-therapeutic at 0.25. H/H low stable. Plt down to 145.   Goal of Therapy:  Heparin level 0.3-0.7 units/ml Monitor platelets by anticoagulation protocol: Yes   Plan:  Increase IV heparin to 550 units/hr  Heparin level 8 hrs  Daily heparin level and CBC Monitor for s/s bleeding  Albertina Parr, PharmD., BCPS Clinical Pharmacist Pager [phone]

## 2017-10-12 NOTE — Progress Notes (Signed)
[facility] Progress Note Patient Name: Carol Carson DOB: [birth year] MRN: [id number]   Date of Service  10/12/2017  HPI/Events of Note  Sinus Tachycardia - HR = 138.  eICU Interventions  Will order: 1. Metoprolol 2.5 mg IV now (extra dose). 2. Increase Metoprolol to 50 mg per tube Q 12 hours.      Intervention Category Major Interventions: Arrhythmia - evaluation and management  Larenz Frasier Eugene 10/12/2017, 4:28 AM

## 2017-10-12 NOTE — Progress Notes (Signed)
Echocardiogram 2D Echocardiogram has been performed.  Carol Carson 10/12/2017, 12:21 PM

## 2017-10-12 NOTE — Consult Note (Signed)
Cardiology Consultation:   Patient ID: Carol Carson; [id number]; 25-Jan-1941   Admit date: 10/10/2017 Date of Consult: 10/12/2017  Primary Care Provider: Leeroy Cha, MD Primary Cardiologist: Dr. Radford Pax Primary Electrophysiologist:  Dr. Serita Grammes   Patient Profile:   Carol Carson is a 76 y.o. female with a hx of SVT, HTN, COPD with oxygen at night remote SOB and C/P with neg ischemia on nuc study who is being seen today for the evaluation of flecainide on levaquin concern for prolonged QTc. at the request of Dr Pearline Cables.  History of Present Illness:   Carol Carson has a hx of SVT, HTN, remote SOB and C/P with neg ischemia on nuc study.  Seen by Dr. Curt Bears in July 2018 and her atenolol was stopped and placed on diltiazem 180 mg daily.  But with Ellipta inhaler the atenolol was resumed and dilt stopped.  She continued on Flecainide at 50.  Thought was to increase to 100 mg but pt could not come in for follow up EKG.  Now admitted in Coma on 10/10/17 -found unresponsive -she had been normal when she went to bed.  Felt to be CO2 retention.  Intubated, hypothermic - on Vent -failed SBT today.  + community acquired PNA, AKI, elevated DDimer and troponin.  Acute encephalopathy as well.    EKGs personally reviewed with ST with prolonged Qtc 505 ms. TELE:  ST   Troponin <0.03; 0.04; 0.06; 0.06; 0.07  Mg + 1.7 K+ 3.2 Na 140, Cr 1.29 Ca+ 7.0  Hgb 10/hct 32 wbc 13.7 DDimer 1.93  Currently wakes but no response.  Resting otherwise.    Past Medical History:  Diagnosis Date  . Chronic renal insufficiency   . COPD (chronic obstructive pulmonary disease) ([place])   . Diverticulosis   . DJD (degenerative joint disease)   . Endometrial cancer ([place])   . Hyperlipidemia   . Hypertension   . IBS (irritable bowel syndrome)   . RLS (restless legs syndrome)   . SVT (supraventricular tachycardia) ([place])   . Uterine cancer [facility])     Past Surgical History:  Procedure Laterality Date    . ABDOMINAL HYSTERECTOMY     d/t unterine cancer///no chemo/no radiation  . LUNG SURGERY  2002   golf ball sized tumor, left lung---spot vanished      Home Medications:  Prior to Admission medications   Medication Sig Start Date End Date Taking? Authorizing Provider  ALPRAZolam (XANAX) 0.25 MG tablet Take 0.25 mg by mouth 2 (two) times daily as needed for anxiety or sleep.  02/08/15  Yes [provider]  amLODipine (NORVASC) 5 MG tablet Take 1 tablet (5 mg total) by mouth daily. 09/01/17 08/27/18 Yes Turner, Eber Hong, MD  atenolol (TENORMIN) 25 MG tablet Take 1 tablet (25 mg total) by mouth 2 (two) times daily. 07/21/17  Yes Camnitz, Will Hassell Done, MD  B Complex-C (SUPER B COMPLEX PO) Take 1 tablet daily by mouth.   Yes [provider]  cholecalciferol (VITAMIN D) 1000 UNITS tablet Take 1,000 Units by mouth daily.   Yes [provider]  ferrous sulfate 325 (65 FE) MG tablet Take 325 mg daily by mouth.   Yes [provider]  flecainide (TAMBOCOR) 50 MG tablet Take 1 tablet (50 mg total) by mouth 2 (two) times daily. 07/29/17  Yes Camnitz, Will Hassell Done, MD  levalbuterol [facility] HFA) 45 MCG/ACT inhaler Inhale 2 puffs into the lungs every 4 (four) hours as needed for wheezing or shortness of breath.  Yes [provider]  Multiple Vitamin (MULTIVITAMIN WITH MINERALS) TABS tablet Take 1 tablet daily by mouth. Centrum   Yes [provider]  OXYGEN Inhale 2-4 L See admin instructions into the lungs. 2lpm with rest, 4 with exertion and 3 lpm with sleep   Yes [provider]  rOPINIRole (REQUIP) 0.25 MG tablet Take 1 tablet (0.25 mg total) by mouth 2 (two) times daily. 04/14/17  Yes Patel, Donika K, DO  simvastatin (ZOCOR) 20 MG tablet TAKE 1 TABLET (20 MG) BY MOUTH IN THE EVENING 07/31/17  Yes [provider]  umeclidinium-vilanterol (ANORO ELLIPTA) 62.5-25 MCG/INH AEPB Inhale 1 puff into the lungs daily. 07/17/17  Yes Brand Males, MD     Inpatient Medications: Scheduled Meds: . aspirin  81 mg Per Tube Daily  . chlorhexidine gluconate (MEDLINE KIT)  15 mL Mouth Rinse BID  . insulin aspart  0-9 Units Subcutaneous Q4H  . ipratropium-albuterol  3 mL Nebulization Q6H  . mouth rinse  15 mL Mouth Rinse 10 times per day  . methylPREDNISolone (SOLU-MEDROL) injection  40 mg Intravenous Q12H  . metoprolol tartrate  50 mg Per Tube BID  . potassium & sodium phosphates  1 packet Oral TID   Continuous Infusions: . sodium chloride 100 mL/hr at 10/12/17 1238  . dexmedetomidine (PRECEDEX) IV infusion 1.2 mcg/kg/hr (10/12/17 1406)  . famotidine (PEPCID) IV Stopped (10/12/17 1219)  . feeding supplement (VITAL AF 1.2 CAL) 1,000 mL (10/12/17 1659)  . fentaNYL infusion INTRAVENOUS 200 mcg/hr (10/12/17 1647)  . heparin 550 Units/hr (10/12/17 1515)  . levofloxacin (LEVAQUIN) IV Stopped (10/12/17 1426)  . phenylephrine (NEO-SYNEPHRINE) Adult infusion Stopped (10/11/17 1230)   PRN Meds: albuterol, fentaNYL (SUBLIMAZE) injection, fentaNYL (SUBLIMAZE) injection, metoprolol tartrate, midazolam, midazolam  Allergies:    Allergies  Allergen Reactions  . Contrast Media [Iodinated Diagnostic Agents] Other (See Comments)    LOW KIDNEY FUNCTION///NEED TO CHECK WITH NEPHROLOGIST BEFORE PROCEDURE.  . Biaxin [Clarithromycin] Hives  . Penicillins Hives    Has patient had a PCN reaction causing immediate rash, facial/tongue/throat swelling, SOB or lightheadedness with hypotension: Yes Has patient had a PCN reaction causing severe rash involving mucus membranes or skin necrosis: No Has patient had a PCN reaction that required hospitalization: No Has patient had a PCN reaction occurring within the last 10 years: No If all of the above answers are "NO", then may proceed with Cephalosporin use.  . Sulfa Antibiotics Rash    Social History:   Social History   Socioeconomic History  . Marital status: Widowed    Spouse name: Not on file  . Number  of children: Not on file  . Years of education: Not on file  . Highest education level: Not on file  Social Needs  . Financial resource strain: Not on file  . Food insecurity - worry: Not on file  . Food insecurity - inability: Not on file  . Transportation needs - medical: Not on file  . Transportation needs - non-medical: Not on file  Occupational History  . Occupation: retired  Tobacco Use  . Smoking status: Former Smoker    Packs/day: 1.30    Years: 43.00    Pack years: 55.90    Types: Cigarettes    Last attempt to quit: 12/08/1998    Years since quitting: 18.8  . Smokeless tobacco: Never Used  Substance and Sexual Activity  . Alcohol use: No    Alcohol/week: 0.0 oz  . Drug use: No  . Sexual activity: Not on  file  Other Topics Concern  . Not on file  Social History Narrative   Lives alone in a one story home.  Has 1 son.     Has not worked since the 60's.     Education: high school.    Family History:    Family History  Problem Relation Age of Onset  . Uterine cancer Mother   . Brain cancer Father   . Parkinson's disease Brother   . Aneurysm Sister        Brain aneusrym  . Stroke Sister   . Healthy Son   . Breast cancer Neg Hx      ROS:  Please see the history of present illness.  ROS  Per H&P per family, prior to admit no acute issues but rapid HR at times General:no colds or fevers, no weight changes Skin:no rashes or ulcers HEENT:no blurred vision, no congestion CV:see HPI PUL:see HPI GI:no diarrhea constipation or melena, no indigestion GU:no hematuria, no dysuria MS:no joint pain, no claudication Neuro:no syncope, no lightheadedness Endo:no diabetes, no thyroid disease      Physical Exam/Data:   Vitals:   10/12/17 1500 10/12/17 1505 10/12/17 1515 10/12/17 1600  BP: 136/81 136/81  (!) 153/88  Pulse: (!) 127 (!) 127  (!) 139  Resp: (!) 26 (!) 26  (!) 23  Temp:      TempSrc:      SpO2: 100% 100% 100% 100%  Weight:      Height:         Intake/Output Summary (Last 24 hours) at 10/12/2017 1721 Last data filed at 10/12/2017 1600 Gross per 24 hour  Intake 4519.91 ml  Output 1903 ml  Net 2616.91 ml   Filed Weights   10/10/17 1325 10/11/17 0437 10/12/17 0400  Weight: 121 lb 7.6 oz (55.1 kg) 128 lb 8.5 oz (58.3 kg) 136 lb 11 oz (62 kg)   Body mass index is 22.75 kg/m.  General:  Frail female, in no acute distress on vent HEENT: normal Lymph: no adenopathy Neck: no JVD Endocrine:  No thryomegaly Vascular: No carotid bruits;  Cardiac:  normal S1, S2; RRR; no murmur rapid heart rate Lungs:  clear to auscultation bilaterally, no wheezing, rhonchi or rales  Abd: soft, nontender, no hepatomegaly  Ext: no edema Musculoskeletal:  No deformities, does not follow instructions. Skin: warm and dry  Neuro:  Sedated, opens eyes when name called Psych:   Flat affect    Relevant CV Studies: Echo 10/12/17  Study Conclusions  - Procedure narrative: Transthoracic echocardiography. Image   quality was adequate. The study was technically difficult, as a   result of restricted patient mobility. Intravenous contrast   (Definity) was administered. - Left ventricle: The cavity size was normal. Wall thickness was   normal. Systolic function was normal. The estimated ejection   fraction was in the range of 60% to 65%. The study is not   technically sufficient to allow evaluation of LV diastolic   function. - Left atrium: The atrium was normal in size. - Right ventricle: Subcostal and contrast images suggest RV   diltiation and severe hypokinesis suggestive of RV strain. - Tricuspid valve: There was trivial regurgitation. - Pulmonary arteries: PA peak pressure: 35 mm Hg (S). - Inferior vena cava: The vessel was dilated. The respirophasic   diameter changes were blunted (< 50%), consistent with elevated   central venous pressure. Patient was noted to be on positive   pressure ventilation. - Pericardium, extracardiac: There was  no pericardial effusion.  Impressions:  - Technically difficult study. Definity contrast given. LVEF   60-65%, normal wall thickness, tachycardic, normal LA size, RVSP   35 mmHg, limited visualization of the RV, however, contrast   images suggest the RV is dilated and severely hypokinetic,   suggesting probable RV strain.   Laboratory Data:  Chemistry Recent Labs  Lab 10/10/17 1011 10/11/17 0423 10/12/17 0233  NA 138 142 140  K 5.1 3.4* 3.2*  CL 98* 104 104  CO2 32 28 27  GLUCOSE 190* 148* 175*  BUN 25* 18 19  CREATININE 1.87* 1.39* 1.29*  CALCIUM 8.2* 7.4* 7.0*  GFRNONAA 25* 36* 39*  GFRAA 29* 41* 45*  ANIONGAP '8 10 9    ' Recent Labs  Lab 10/10/17 1011 10/11/17 0423  PROT 5.7* 5.1*  ALBUMIN 3.1* 2.7*  AST 17 26  ALT 6* 9*  ALKPHOS 42 37*  BILITOT 0.4 0.5   Hematology Recent Labs  Lab 10/10/17 1011 10/11/17 0423 10/12/17 0233  WBC 7.8 10.6* 13.7*  RBC 3.56* 3.58* 3.43*  HGB 10.4* 10.5* 10.0*  HCT 35.5* 33.9* 32.1*  MCV 99.7 94.7 93.6  MCH 29.2 29.3 29.2  MCHC 29.3* 31.0 31.2  RDW 12.6 12.8 13.5  PLT 199 191 145*   Cardiac Enzymes Recent Labs  Lab 10/10/17 1841 10/11/17 0423 10/11/17 1155 10/11/17 1642 10/12/17 0014  TROPONINI <0.03 0.04* 0.06* 0.06* 0.07*   No results for input(s): TROPIPOC in the last 168 hours.  BNPNo results for input(s): BNP, PROBNP in the last 168 hours.  DDimer  Recent Labs  Lab 10/10/17 1841  DDIMER 1.93*    Radiology/Studies:  Ct Head Wo Contrast  Result Date: 10/10/2017 CLINICAL DATA:  Unresponsive EXAM: CT HEAD WITHOUT CONTRAST TECHNIQUE: Contiguous axial images were obtained from the base of the skull through the vertex without intravenous contrast. COMPARISON:  None. FINDINGS: Brain: There is low-density within the anterior limb of the left internal capsule. There is also low-density in the anterior external capsule. The insular cortex is intact. There is no obvious mass effect or hemorrhage. Nonspecific  tiny calcification is present in the right occipital cortex. Chronic ischemic changes in the periventricular white matter are superimposed. Vascular: No hyperdense vessel or unexpected calcification. Skull: Cranium is intact. Sinuses/Orbits: There is mucosal thickening in the anterior ethmoid air cells. Maxillary sinus, sphenoid sinus, and mastoid air cells are clear. Frontal sinuses are clear. Orbits are within normal limits for age. Other: Noncontributory. IMPRESSION: There is low-density within the left internal and external capsule as described. Age indeterminate infarcts are suggested. Please note that the insular cortex is within normal limits and there is no evidence of hyperdense MCA. MRI may be helpful to further characterize. Superimpose chronic ischemic changes are noted. Electronically Signed   By: Marybelle Killings M.D.   On: 10/10/2017 11:26   Ct Chest Wo Contrast  Result Date: 10/12/2017 CLINICAL DATA:  Shortness of breath and hypoxia. EXAM: CT CHEST WITHOUT CONTRAST TECHNIQUE: Multidetector CT imaging of the chest was performed following the standard protocol without IV contrast. COMPARISON:  Chest radiograph October 12, 2017; chest CT August 13, 2006 FINDINGS: Cardiovascular: There is no appreciable thoracic aortic aneurysm. There is calcification at the origins and mid portions of the visualized great vessels without apparent hemodynamically significant obstruction in these vessels. There is calcification in the thoracic aorta. There are multiple foci of coronary artery calcification. There is slight pericardial thickening without well-defined pericardial effusion. Mediastinum/Nodes: There is mild inhomogeneity in the visualized  thyroid without dominant thyroid mass evident. There is no evident thoracic adenopathy. Nasogastric tube extends into the stomach. There is no demonstrable thyroid wall thickening on this noncontrast enhanced study. Lungs/Pleura: The patient is intubated with endotracheal  tube in the mid trachea. There is mild debris in the posterior mid trachea, possibly secondary to the intubation. No pneumothorax. There is scarring and pleural thickening in each lung apex. There is an area of opacity abutting the pleura in the anterior segment of the left upper lobe measuring 1.3 x 1.1 cm which now contains diffuse calcification. There is a nodular opacity in the lateral segment of the left lower lobe seen on axial slice 74 series 4 measuring 7 x 6 mm there are pleural effusions bilaterally with bibasilar atelectatic type change. There are areas of scarring in the right middle lobe and inferior lingular regions. There is a stable 3 mm nodular opacity in the inferior lingula seen on axial slice 95 series 4. There is a nodular appearing opacity in the superior segment of the left lower lobe seen on axial slice 86 series 4 measuring 5 x 5 mm which may represent localized atelectasis as opposed to in actual nodule. Which was not present on prior study. Upper Abdomen: In the visualized upper abdomen, there is atherosclerotic calcification in the aorta. Mild ascites is appreciable adjacent to the liver. Musculoskeletal: There are foci of degenerative change in the thoracic spine. There is slight anterior wedging at T12 along the leftward aspect of superior endplate. This appearance is stable compared to prior CT examination. IMPRESSION: 1. Bilateral pleural effusions with bibasilar atelectasis. No well-defined airspace consolidation. 2. Previous nodular opacity abutting the pleura in the left upper lobe is now diffusely calcified and felt to represent benign etiology. There are new nodular opacities on the left, largest measuring 7 x 6 mm in the lateral segment left lower lobe. Non-contrast chest CT at 6-12 months is recommended. If the nodule is stable at time of repeat CT, then future CT at 18-24 months (from today's scan) is considered optional for low-risk patients, but is recommended for high-risk  patients. This recommendation follows the consensus statement: Guidelines for Management of Incidental Pulmonary Nodules Detected on CT Images: From the Fleischner Society 2017; Radiology 2017; 284:228-243. 3.  No evident adenopathy. 4. Endotracheal tube tip in mid trachea. There is debris along the dependent portion of the trachea in this area, potentially due to intubation. Nasogastric tube extends in the stomach. No pneumothorax. 5. There is aortic atherosclerosis. There are multiple foci of coronary artery calcification as well as foci of great vessel calcification. 6.  Mild upper abdominal ascites, incompletely visualized. Aortic Atherosclerosis (ICD10-I70.0). Electronically Signed   By: Lowella Grip III M.D.   On: 10/12/2017 11:09   Dg Chest Port 1 View  Result Date: 10/12/2017 CLINICAL DATA:  Thoracic aortic atherosclerosis. EXAM: PORTABLE CHEST 1 VIEW COMPARISON:  Chest x-ray of October 10, 2017 FINDINGS: The lungs remain hyperinflated. There is a trace of blunting of the left lateral costophrenic angle. There is a stable 10 x 13 mm radiodensity peripherally in the left upper lobe which appears calcified. The heart and pulmonary vascularity are normal. There is calcification in the wall of the aortic arch. The endotracheal tube tip projects 5.9 cm above the carina. The esophagogastric tube tip in proximal port project below the GE junction. IMPRESSION: No acute pneumonia nor CHF. Chronic hyperinflation and interstitial prominence consistent with COPD. Possible trace left pleural effusion. Evidence of previous granulomatous disease. The  support tubes are in reasonable position. Electronically Signed   By: David  Martinique M.D.   On: 10/12/2017 08:09   Dg Chest Portable 1 View  Result Date: 10/10/2017 CLINICAL DATA:  76 year old female with a history of intubation EXAM: PORTABLE CHEST 1 VIEW COMPARISON:  09/15/2016, 06/28/2015 FINDINGS: Cardiomediastinal silhouette unchanged in size and contour. No  evidence of central vascular congestion. Diffuse coarsening of interstitial markings, similar to comparison chest x-rays. No new confluent airspace disease. Architectural distortion at the left lung base, similar to prior. No large pleural effusion. Hyperexpansion with relative flattening of the hemidiaphragms, compatible with emphysema. Calcified nodule the left upper chest is again noted, potentially involving the rib. Interval placement of endotracheal tube, which terminates suitably above the carina, approximately 5.6 cm. Interval placement of gastric tube, terminating out of the field of view. IMPRESSION: Chronic lung changes and emphysema without evidence of superimposed acute cardiopulmonary disease. Interval placement of endotracheal tube which terminates suitably above the carina. Interval placement gastric tube terminating Electronically Signed   By: Corrie Mckusick D.O.   On: 10/10/2017 10:35   Dg Abd Portable 1 View  Result Date: 10/10/2017 CLINICAL DATA:  76 year old female with recent intubation. EXAM: PORTABLE ABDOMEN - 1 VIEW COMPARISON:  None. FINDINGS: Limited plain film of the upper abdomen demonstrates gastric tube with the side port terminating in the stomach lumen and the tip out of the field of view. Gas within stomach. IMPRESSION: Limited plain film of the upper abdomen demonstrates gastric tube terminating in the stomach, although the tip projects beyond the field of view of the study. Electronically Signed   By: Corrie Mckusick D.O.   On: 10/10/2017 10:36    Assessment and Plan:   ST rate 130 and at times SVT 140 would wait to begin flecainide -she is on levoquin now she is on lopressor 25 BID- increase to 50 BID if no improvement will ask EP to see in AM she was having more episodes prior to admit  Elevated troponin due to demand ischemia  On CT of chest there are multiple foci of coronary artery calcification.   Aortic atherosclerosis   Respiratory failure, unresponsive at home,  intubated in ER.  Elevated d dimer with neg DVT on IV heparin.         For questions or updates, please contact [facility] Please consult [email or web address] for contact info under Cardiology/STEMI.   Signed, Cecilie Kicks, NP  10/12/2017 5:21 PM  Personally seen and examined. Agree with above.  76 year old with chronic history of tachycardia, SVT, sinus tachycardia previously seen by Dr. Curt Bears.  Currently her heart rate is in the 130s and appears to be sinus.  She does clearly demonstrate on telemetry personally viewed junctional and PSVT.  She is currently intubated, OG tube in place.  She awakened to sensory stimuli.  Heart is tachycardic and regular.  No appreciable murmurs.  Sinus tachycardia/SVT -In review of records, this is been an ongoing issue and she has seen electrophysiology, Dr. Curt Bears about this.  Flecanide was utilized and there was potential for increasing flecainide however patient needed to come into the office for ECG prior to increase.  There is also concerned about utilization of flecainide currently because of concomitant  quinolone use and potential for QT prolongation risk.  Traditionally, flecanide should mostly affect QRS interval duration because it is a sodium channel blocker.   There is also some concern about utilizing diltiazem previously because of an interaction with a specialized inhaler called Ellipta.   -  Plan for this evening is to increase metoprolol to 50 mg twice a day from 25 mg twice a day. - If tachycardia remains, I have low threshold to discuss with electrophysiology.  Agree with demand ischemia.  Low level troponin in the setting of tachycardia and COPD exacerbation.  Candee Furbish, MD

## 2017-10-12 NOTE — Progress Notes (Signed)
[facility] Progress Note Patient Name: Carol Carson DOB: [birth year] MRN: [id number]   Date of Service  10/12/2017  HPI/Events of Note  Mg++ = 1.4 and Creatinine = 1.29.  eICU Interventions  Will replace Mg++.     Intervention Category Major Interventions: Electrolyte abnormality - evaluation and management  Sommer,Steven Eugene 10/12/2017, 8:54 PM

## 2017-10-12 NOTE — Progress Notes (Signed)
Pt transported to and from 4N27 to CT2 on ventilator. Pt stable throughout with no complications. VS within normal limits. RT will continue to monitor.

## 2017-10-12 NOTE — Progress Notes (Addendum)
[facility] for Heparin Indication: r/o PE  Allergies  Allergen Reactions  . Contrast Media [Iodinated Diagnostic Agents] Other (See Comments)    LOW KIDNEY FUNCTION///NEED TO CHECK WITH NEPHROLOGIST BEFORE PROCEDURE.  . Biaxin [Clarithromycin] Hives  . Penicillins Hives    Has patient had a PCN reaction causing immediate rash, facial/tongue/throat swelling, SOB or lightheadedness with hypotension: Yes Has patient had a PCN reaction causing severe rash involving mucus membranes or skin necrosis: No Has patient had a PCN reaction that required hospitalization: No Has patient had a PCN reaction occurring within the last 10 years: No If all of the above answers are "NO", then may proceed with Cephalosporin use.  . Sulfa Antibiotics Rash   Patient Measurements: Height: 5\' 5"  (165.1 cm) Weight: 128 lb 8.5 oz (58.3 kg) IBW/kg (Calculated) : 57 Heparin Dosing Weight: 58 kg  Vital Signs: Temp: 94.5 F (34.7 C) (11/05 0030) Temp Source: Rectal (11/05 0030) BP: 144/82 (11/04 2345) Pulse Rate: 117 (11/04 2300)  Labs: Recent Labs    10/10/17 1011  10/11/17 0423 10/11/17 1155 10/11/17 1642 10/12/17 0014 10/12/17 0233  HGB 10.4*  --  10.5*  --   --   --  10.0*  HCT 35.5*  --  33.9*  --   --   --  32.1*  PLT 199  --  191  --   --   --  145*  LABPROT 12.7  --   --   --   --   --   --   INR 0.96  --   --   --   --   --   --   HEPARINUNFRC  --   --  1.42*  --  0.71*  --  1.00*  CREATININE 1.87*  --  1.39*  --   --   --   --   TROPONINI  --    < > 0.04* 0.06* 0.06* 0.07*  --    < > = values in this interval not displayed.    Estimated Creatinine Clearance: 30.5 mL/min (A) (by C-G formula based on SCr of 1.39 mg/dL (H)).  Assessment:  Patient admitted with SOB after being found down- intubated in the ED. Ddimer elevated 1.93 and started in IV heparin for r/o PE. Noted bilateral LE dopplers negative for DVT.  Heparin levels remain elevated, last  at 1 after rate decrease. RN reports lab was drawn in arm opposite the heparin gtt. Hgb stable and plts downtrending slightly. RN reports new bruising noted as well as oozing around IV sites.   Goal of Therapy:  Heparin level 0.3-0.7 units/ml Monitor platelets by anticoagulation protocol: Yes   Plan:  Hold heparin gtt x1 hr and resume at 500 units/hr  Heparin level 8 hrs after gtt resumed Daily heparin level and CBC Monitor for s/s bleeding  Lavonda Jumbo, PharmD Clinical Pharmacist 10/12/17 3:21 AM

## 2017-10-12 NOTE — Plan of Care (Signed)
Patient started on continuous sedation as well as tube feedings today.

## 2017-10-12 NOTE — Progress Notes (Signed)
Dear Doctor: Pearline Cables This patient has been identified as a candidate for PICC for the following reason (s): IV therapy over 48 hours and restarts due to phlebitis and infiltration in 24 hours.  Large amount of bruising to both arms with very limited venous access.  If you agree, please write an order for the indicated device. For any questions contact the Vascular Access Team at [phone] if no answer, please leave a message.  Thank you for supporting the early vascular access assessment program.

## 2017-10-12 NOTE — Progress Notes (Signed)
Dr Pearline Cables notified of patient with increased agitation and tachycardia in 130-150s with no relief from metoprolol. He stated he will review orders. Will continue to monitor.

## 2017-10-13 DIAGNOSIS — J9622 Acute and chronic respiratory failure with hypercapnia: Secondary | ICD-10-CM

## 2017-10-13 DIAGNOSIS — J9602 Acute respiratory failure with hypercapnia: Secondary | ICD-10-CM

## 2017-10-13 LAB — GLUCOSE, CAPILLARY
GLUCOSE-CAPILLARY: 147 mg/dL — AB (ref 65–99)
GLUCOSE-CAPILLARY: 158 mg/dL — AB (ref 65–99)
GLUCOSE-CAPILLARY: 137 mg/dL — AB (ref 65–99)
GLUCOSE-CAPILLARY: 158 mg/dL — AB (ref 65–99)
Glucose-Capillary: 152 mg/dL — ABNORMAL HIGH (ref 65–99)
Glucose-Capillary: 127 mg/dL — ABNORMAL HIGH (ref 65–99)
Glucose-Capillary: 160 mg/dL — ABNORMAL HIGH (ref 65–99)

## 2017-10-13 LAB — CBC
HCT: 32.1 % — ABNORMAL LOW (ref 36.0–46.0)
HEMOGLOBIN: 10 g/dL — AB (ref 12.0–15.0)
MCH: 28.7 pg (ref 26.0–34.0)
MCHC: 31.2 g/dL (ref 30.0–36.0)
MCV: 92.2 fL (ref 78.0–100.0)
Platelets: 143 10*3/uL — ABNORMAL LOW (ref 150–400)
RBC: 3.48 MIL/uL — ABNORMAL LOW (ref 3.87–5.11)
RDW: 14.3 % (ref 11.5–15.5)
WBC: 16.4 10*3/uL — ABNORMAL HIGH (ref 4.0–10.5)

## 2017-10-13 LAB — BLOOD GAS, ARTERIAL
ACID-BASE EXCESS: 5 mmol/L — AB (ref 0.0–2.0)
Bicarbonate: 28.5 mmol/L — ABNORMAL HIGH (ref 20.0–28.0)
DRAWN BY: 51191
FIO2: 40
MECHVT: 450 mL
O2 SAT: 98.9 %
PEEP/CPAP: 5 cmH2O
PH ART: 7.487 — AB (ref 7.350–7.450)
PO2 ART: 118 mmHg — AB (ref 83.0–108.0)
Patient temperature: 97.6
RATE: 26 resp/min
pCO2 arterial: 37.9 mmHg (ref 32.0–48.0)

## 2017-10-13 LAB — MAGNESIUM: MAGNESIUM: 1.8 mg/dL (ref 1.7–2.4)

## 2017-10-13 LAB — CULTURE, RESPIRATORY

## 2017-10-13 LAB — HEPARIN LEVEL (UNFRACTIONATED)
HEPARIN UNFRACTIONATED: 0.33 [IU]/mL (ref 0.30–0.70)
Heparin Unfractionated: 0.29 IU/mL — ABNORMAL LOW (ref 0.30–0.70)
Heparin Unfractionated: 0.24 IU/mL — ABNORMAL LOW (ref 0.30–0.70)

## 2017-10-13 LAB — PHOSPHORUS: Phosphorus: 3.6 mg/dL (ref 2.5–4.6)

## 2017-10-13 LAB — CULTURE, RESPIRATORY W GRAM STAIN

## 2017-10-13 LAB — TSH: TSH: 1.481 u[IU]/mL (ref 0.350–4.500)

## 2017-10-13 LAB — TRIGLYCERIDES: Triglycerides: 100 mg/dL (ref <150)

## 2017-10-13 MED ORDER — METOPROLOL TARTRATE 25 MG/10 ML ORAL SUSPENSION
25.0000 mg | Freq: Once | ORAL | Status: AC
  Administered 2017-10-13: 25 mg

## 2017-10-13 MED ORDER — IPRATROPIUM-ALBUTEROL 0.5-2.5 (3) MG/3ML IN SOLN
3.0000 mL | Freq: Three times a day (TID) | RESPIRATORY_TRACT | Status: DC
  Administered 2017-10-13 – 2017-10-14 (×4): 3 mL via RESPIRATORY_TRACT
  Filled 2017-10-13 (×5): qty 3

## 2017-10-13 MED ORDER — METHYLPREDNISOLONE SODIUM SUCC 40 MG IJ SOLR
20.0000 mg | Freq: Two times a day (BID) | INTRAMUSCULAR | Status: DC
  Administered 2017-10-13 – 2017-10-15 (×4): 20 mg via INTRAVENOUS
  Filled 2017-10-13 (×4): qty 1

## 2017-10-13 MED ORDER — METOPROLOL TARTRATE 25 MG/10 ML ORAL SUSPENSION
50.0000 mg | Freq: Four times a day (QID) | ORAL | Status: DC
  Administered 2017-10-13: 25 mg
  Administered 2017-10-13 – 2017-10-14 (×5): 50 mg
  Filled 2017-10-13 (×6): qty 20

## 2017-10-13 NOTE — Progress Notes (Addendum)
ANTICOAGULATION CONSULT NOTE - Follow Up Consult  Pharmacy Consult for heparin Indication: possible PE  Labs: Recent Labs    10/10/17 1011  10/11/17 0423 10/11/17 1155 10/11/17 1642 10/12/17 0014 10/12/17 0233 10/12/17 1403 10/13/17 0030  HGB 10.4*  --  10.5*  --   --   --  10.0*  --   --   HCT 35.5*  --  33.9*  --   --   --  32.1*  --   --   PLT 199  --  191  --   --   --  145*  --   --   LABPROT 12.7  --   --   --   --   --   --   --   --   INR 0.96  --   --   --   --   --   --   --   --   HEPARINUNFRC  --    < > 1.42*  --  0.71*  --  1.00* 0.25* 0.33  CREATININE 1.87*  --  1.39*  --   --   --  1.29*  --   --   TROPONINI  --    < > 0.04* 0.06* 0.06* 0.07*  --   --   --    < > = values in this interval not displayed.    Assessment/Plan:  76yo female therapeutic on heparin after rate changes. Will continue gtt at current rate and confirm stable with additional level.   Wynona Neat, PharmD, BCPS  10/13/2017,12:57 AM   ADDENDUM: Repeat level slightly below goal.  Will increase heparin gtt slightly to 600 units/hr and check level in 8hr. VB 5:58 AM

## 2017-10-13 NOTE — Progress Notes (Signed)
Progress Note  Patient Name: Carol Carson Date of Encounter: 10/13/2017  Primary Cardiologist: Turner/Allred/Camnitz  Subjective   On vent, wide awake. Trying to talk.   Inpatient Medications    Scheduled Meds: . aspirin  81 mg Per Tube Daily  . chlorhexidine gluconate (MEDLINE KIT)  15 mL Mouth Rinse BID  . insulin aspart  0-9 Units Subcutaneous Q4H  . ipratropium-albuterol  3 mL Nebulization TID  . mouth rinse  15 mL Mouth Rinse 10 times per day  . methylPREDNISolone (SOLU-MEDROL) injection  20 mg Intravenous Q12H  . metoprolol tartrate  50 mg Per Tube Q6H   Continuous Infusions: . sodium chloride 100 mL/hr at 10/13/17 1100  . dexmedetomidine (PRECEDEX) IV infusion 0.6 mcg/kg/hr (10/13/17 1100)  . famotidine (PEPCID) IV Stopped (10/12/17 1219)  . feeding supplement (VITAL AF 1.2 CAL) Stopped (10/13/17 0818)  . heparin 600 Units/hr (10/13/17 1000)  . levofloxacin (LEVAQUIN) IV Stopped (10/12/17 1426)  . phenylephrine (NEO-SYNEPHRINE) Adult infusion Stopped (10/11/17 1230)   PRN Meds: albuterol, metoprolol tartrate, midazolam   Vital Signs    Vitals:   10/13/17 0900 10/13/17 1000 10/13/17 1100 10/13/17 1123  BP: 113/74 138/70 125/63 125/63  Pulse: (!) 138 (!) 152 (!) 145 (!) 147  Resp: '11 18 11 19  ' Temp:      TempSrc:      SpO2: 95% 97% 93% 97%  Weight:      Height:        Intake/Output Summary (Last 24 hours) at 10/13/2017 1150 Last data filed at 10/13/2017 1100 Gross per 24 hour  Intake 4426.79 ml  Output 4350 ml  Net 76.79 ml   Filed Weights   10/11/17 0437 10/12/17 0400 10/13/17 0500  Weight: 128 lb 8.5 oz (58.3 kg) 136 lb 11 oz (62 kg) 132 lb 15 oz (60.3 kg)    Telemetry    Incessant sinus tachycardia 140- Personally Reviewed  ECG    Stach - Personally Reviewed  Physical Exam   GEN: No acute distress. On vent  Neck: No JVD, ETT Cardiac: Tachy reg, no murmurs, rubs, or gallops.  Respiratory: Clear to auscultation bilaterally. GI: Soft,  nontender, non-distended  MS: No edema; No deformity. Neuro:  Nonfocal  Psych: Alert, trying to talk  Labs    Chemistry Recent Labs  Lab 10/10/17 1011 10/11/17 0423 10/12/17 0233  NA 138 142 140  K 5.1 3.4* 3.2*  CL 98* 104 104  CO2 32 28 27  GLUCOSE 190* 148* 175*  BUN 25* 18 19  CREATININE 1.87* 1.39* 1.29*  CALCIUM 8.2* 7.4* 7.0*  PROT 5.7* 5.1*  --   ALBUMIN 3.1* 2.7*  --   AST 17 26  --   ALT 6* 9*  --   ALKPHOS 42 37*  --   BILITOT 0.4 0.5  --   GFRNONAA 25* 36* 39*  GFRAA 29* 41* 45*  ANIONGAP '8 10 9     ' Hematology Recent Labs  Lab 10/11/17 0423 10/12/17 0233 10/13/17 0455  WBC 10.6* 13.7* 16.4*  RBC 3.58* 3.43* 3.48*  HGB 10.5* 10.0* 10.0*  HCT 33.9* 32.1* 32.1*  MCV 94.7 93.6 92.2  MCH 29.3 29.2 28.7  MCHC 31.0 31.2 31.2  RDW 12.8 13.5 14.3  PLT 191 145* 143*    Cardiac Enzymes Recent Labs  Lab 10/11/17 0423 10/11/17 1155 10/11/17 1642 10/12/17 0014  TROPONINI 0.04* 0.06* 0.06* 0.07*   No results for input(s): TROPIPOC in the last 168 hours.   BNPNo results for  input(s): BNP, PROBNP in the last 168 hours.   DDimer  Recent Labs  Lab 10/10/17 1841  DDIMER 1.93*     Radiology    Ct Chest Wo Contrast  Result Date: 10/12/2017 CLINICAL DATA:  Shortness of breath and hypoxia. EXAM: CT CHEST WITHOUT CONTRAST TECHNIQUE: Multidetector CT imaging of the chest was performed following the standard protocol without IV contrast. COMPARISON:  Chest radiograph October 12, 2017; chest CT August 13, 2006 FINDINGS: Cardiovascular: There is no appreciable thoracic aortic aneurysm. There is calcification at the origins and mid portions of the visualized great vessels without apparent hemodynamically significant obstruction in these vessels. There is calcification in the thoracic aorta. There are multiple foci of coronary artery calcification. There is slight pericardial thickening without well-defined pericardial effusion. Mediastinum/Nodes: There is mild  inhomogeneity in the visualized thyroid without dominant thyroid mass evident. There is no evident thoracic adenopathy. Nasogastric tube extends into the stomach. There is no demonstrable thyroid wall thickening on this noncontrast enhanced study. Lungs/Pleura: The patient is intubated with endotracheal tube in the mid trachea. There is mild debris in the posterior mid trachea, possibly secondary to the intubation. No pneumothorax. There is scarring and pleural thickening in each lung apex. There is an area of opacity abutting the pleura in the anterior segment of the left upper lobe measuring 1.3 x 1.1 cm which now contains diffuse calcification. There is a nodular opacity in the lateral segment of the left lower lobe seen on axial slice 74 series 4 measuring 7 x 6 mm there are pleural effusions bilaterally with bibasilar atelectatic type change. There are areas of scarring in the right middle lobe and inferior lingular regions. There is a stable 3 mm nodular opacity in the inferior lingula seen on axial slice 95 series 4. There is a nodular appearing opacity in the superior segment of the left lower lobe seen on axial slice 86 series 4 measuring 5 x 5 mm which may represent localized atelectasis as opposed to in actual nodule. Which was not present on prior study. Upper Abdomen: In the visualized upper abdomen, there is atherosclerotic calcification in the aorta. Mild ascites is appreciable adjacent to the liver. Musculoskeletal: There are foci of degenerative change in the thoracic spine. There is slight anterior wedging at T12 along the leftward aspect of superior endplate. This appearance is stable compared to prior CT examination. IMPRESSION: 1. Bilateral pleural effusions with bibasilar atelectasis. No well-defined airspace consolidation. 2. Previous nodular opacity abutting the pleura in the left upper lobe is now diffusely calcified and felt to represent benign etiology. There are new nodular opacities on  the left, largest measuring 7 x 6 mm in the lateral segment left lower lobe. Non-contrast chest CT at 6-12 months is recommended. If the nodule is stable at time of repeat CT, then future CT at 18-24 months (from today's scan) is considered optional for low-risk patients, but is recommended for high-risk patients. This recommendation follows the consensus statement: Guidelines for Management of Incidental Pulmonary Nodules Detected on CT Images: From the Fleischner Society 2017; Radiology 2017; 284:228-243. 3.  No evident adenopathy. 4. Endotracheal tube tip in mid trachea. There is debris along the dependent portion of the trachea in this area, potentially due to intubation. Nasogastric tube extends in the stomach. No pneumothorax. 5. There is aortic atherosclerosis. There are multiple foci of coronary artery calcification as well as foci of great vessel calcification. 6.  Mild upper abdominal ascites, incompletely visualized. Aortic Atherosclerosis (ICD10-I70.0). Electronically Signed  By: Lowella Grip III M.D.   On: 10/12/2017 11:09   Dg Chest Port 1 View  Result Date: 10/12/2017 CLINICAL DATA:  Thoracic aortic atherosclerosis. EXAM: PORTABLE CHEST 1 VIEW COMPARISON:  Chest x-ray of October 10, 2017 FINDINGS: The lungs remain hyperinflated. There is a trace of blunting of the left lateral costophrenic angle. There is a stable 10 x 13 mm radiodensity peripherally in the left upper lobe which appears calcified. The heart and pulmonary vascularity are normal. There is calcification in the wall of the aortic arch. The endotracheal tube tip projects 5.9 cm above the carina. The esophagogastric tube tip in proximal port project below the GE junction. IMPRESSION: No acute pneumonia nor CHF. Chronic hyperinflation and interstitial prominence consistent with COPD. Possible trace left pleural effusion. Evidence of previous granulomatous disease. The support tubes are in reasonable position. Electronically Signed    By: David  Martinique M.D.   On: 10/12/2017 08:09    Cardiac Studies   ECHO 10/12/17- normal EF  Patient Profile     76 y.o. female history of tachycardia, though sinus tachycardia as well as SVT on ventilator after being unresponsive at home.  Assessment & Plan    Sinus tachycardia -I agree with increasing her metoprolol now to 50 mg every 6 hours.  We will give her an additional 25 mg dose now. - Her telemetry does appear to be incessant sinus tachycardia.  Does not fluctuate all that much.  EF reassuringly normal left ventricular function -In the past she has been on flecainide 50 mg twice a day but this is currently on hold because she is on Levaquin.  If she can come off of the Levaquin, I think would be worth it to try her on the flecainide again.  The worry is interaction with Levaquin and prolonged QT.  Demand ischemia -Low level troponin increase likely a result of her current illness.  Central apnea -Challenging.  For questions or updates, please contact [place] Please consult [email or web address] for contact info under Cardiology/STEMI.      Signed, Candee Furbish, MD  10/13/2017, 11:50 AM

## 2017-10-13 NOTE — Progress Notes (Signed)
ANTICOAGULATION CONSULT NOTE - Follow Up Consult  Pharmacy Consult for heparin Indication: possible PE  Labs: Recent Labs    10/11/17 0423 10/11/17 1155 10/11/17 1642 10/12/17 0014 10/12/17 0233  10/13/17 0030 10/13/17 0455 10/13/17 1414  HGB 10.5*  --   --   --  10.0*  --   --  10.0*  --   HCT 33.9*  --   --   --  32.1*  --   --  32.1*  --   PLT 191  --   --   --  145*  --   --  143*  --   HEPARINUNFRC 1.42*  --  0.71*  --  1.00*   < > 0.33 0.29* 0.24*  CREATININE 1.39*  --   --   --  1.29*  --   --   --   --   TROPONINI 0.04* 0.06* 0.06* 0.07*  --   --   --   --   --    < > = values in this interval not displayed.    Assessment/Plan:  76yo female on heparin for possible PE. CT without contrast yesterday showed no acute pathology. Critical Care notes indicate continuing heparin infusion for now until able to obtain contrast CT once renal function improves.   Heparin levels have been slightly subtherapeutic, with most recent value at 0.24 on heparin 600 units/hr. CBC remains stable. No signs/symptoms of bleeding. No issues with infusion per nursing. Of note, levels were previously supratherapeutic on 700 units/hr.  Goal of Therapy:  Heparin level 0.3-0.7 units/ml Monitor platelets by anticoagulation protocol: Yes  1. Increase heparin rate to 650 units/hr.   2. Obtain level in 8 hours  3. Monitor daily HL and CBC, watch for s/sx of bleeding  Doylene Canard, PharmD Clinical Pharmacist  Pager: [phone] Phone: [phone]

## 2017-10-13 NOTE — Progress Notes (Signed)
PULMONARY / CRITICAL CARE MEDICINE   Name: Carol Carson MRN: [id number] DOB: 1941-03-17    ADMISSION DATE:  10/10/2017 CONSULTATION DATE: October 10, 2017  REFERRING MD: Dr. Alvino Chapel  CHIEF COMPLAINT: Found down, shortness of breath  HISTORY OF PRESENT ILLNESS:   76 year old female with a past medical history significant for COPD who was intubated in the [facility] emergency department on October 10, 2017 for hypercapnic respiratory failure.  She was initially hypothermic but subsequently had a temperature and an elevated lactate.  She was extensively volume loaded and empirically was started on Levaquin in addition to systemic steroids and bronchodilators.  She has remained ventilator dependent and failed an attempted SBT 11/6 despite a low minute volume and O2 requirement. Sedation was modified. This morning she is calmly interactive on 0.6 Precedex. She became apneic when I switched her to pressure support ventilation. She is tachycardic, denying dyspnea or CP. CT of chest yesterday without contrast did not show acute pathology. Echo shows severe LV dysfunction.  SUBJECTIVE:   VITAL SIGNS: BP (!) 109/56 (BP Location: Left Leg)   Pulse (!) 148 Comment: metoprolol given   Temp 97.7 F (36.5 C) (Axillary)   Resp (!) 26   Ht 5\' 5"  (1.651 m)   Wt 132 lb 15 oz (60.3 kg)   SpO2 96%   BMI 22.12 kg/m   HEMODYNAMICS:    VENTILATOR SETTINGS: Vent Mode: PRVC FiO2 (%):  [30 %-40 %] 30 % Set Rate:  [26 bmp] 26 bmp Vt Set:  [450 mL] 450 mL PEEP:  [5 cmH20] 5 cmH20 Plateau Pressure:  [18 cmH20-25 cmH20] 18 cmH20  INTAKE / OUTPUT: I/O last 3 completed shifts: In: 7097.1 [I.V.:5123; NG/GT:1470.8; IV Piggyback:503.3] Out: 6103 [Urine:6100; Emesis/NG output:3]  PHYSICAL EXAMINATION:  General: Elderly white female who is orally intubated and mechanically ventilated. Alert and calmly interactive PULM:   There is symmetric air movement scattered rhonchi no wheezes  CV: tachy,  regular, no mgr GI: BS+, soft, nontender MSK: normal bulk and tone No edema     LABS:  BMET Recent Labs  Lab 10/10/17 1011 10/11/17 0423 10/12/17 0233  NA 138 142 140  K 5.1 3.4* 3.2*  CL 98* 104 104  CO2 32 28 27  BUN 25* 18 19  CREATININE 1.87* 1.39* 1.29*  GLUCOSE 190* 148* 175*    Electrolytes Recent Labs  Lab 10/10/17 1011 10/11/17 0423  10/12/17 0233 10/12/17 1642 10/13/17 0455  CALCIUM 8.2* 7.4*  --  7.0*  --   --   MG  --  2.0   < > 1.7 1.4* 1.8  PHOS  --  <1.0*   < > 2.7 3.7 3.6   < > = values in this interval not displayed.    CBC Recent Labs  Lab 10/11/17 0423 10/12/17 0233 10/13/17 0455  WBC 10.6* 13.7* 16.4*  HGB 10.5* 10.0* 10.0*  HCT 33.9* 32.1* 32.1*  PLT 191 145* 143*    Coag's Recent Labs  Lab 10/10/17 1011  INR 0.96    Sepsis Markers Recent Labs  Lab 10/10/17 1018 10/10/17 1638 10/10/17 1841 10/11/17 0423 10/12/17 0233  LATICACIDVEN 0.71 3.6* 4.1*  --   --   PROCALCITON  --  <0.10  --  0.24 0.17    ABG Recent Labs  Lab 10/10/17 1621 10/10/17 2135 10/13/17 0420  PHART 7.197* 7.447 7.487*  PCO2ART 61.4* 40.2 37.9  PO2ART 64.0* 102.0 118*    Liver Enzymes Recent Labs  Lab 10/10/17 1011 10/11/17 0423  AST 17 26  ALT 6* 9*  ALKPHOS 42 37*  BILITOT 0.4 0.5  ALBUMIN 3.1* 2.7*    Cardiac Enzymes Recent Labs  Lab 10/11/17 1155 10/11/17 1642 10/12/17 0014  TROPONINI 0.06* 0.06* 0.07*    Glucose Recent Labs  Lab 10/12/17 1213 10/12/17 1558 10/12/17 1948 10/12/17 2335 10/13/17 0347 10/13/17 0810  GLUCAP 141* 115* 117* 157* 152* 127*    Imaging Ct Chest Wo Contrast  Result Date: 10/12/2017 CLINICAL DATA:  Shortness of breath and hypoxia. EXAM: CT CHEST WITHOUT CONTRAST TECHNIQUE: Multidetector CT imaging of the chest was performed following the standard protocol without IV contrast. COMPARISON:  Chest radiograph October 12, 2017; chest CT August 13, 2006 FINDINGS: Cardiovascular: There is no  appreciable thoracic aortic aneurysm. There is calcification at the origins and mid portions of the visualized great vessels without apparent hemodynamically significant obstruction in these vessels. There is calcification in the thoracic aorta. There are multiple foci of coronary artery calcification. There is slight pericardial thickening without well-defined pericardial effusion. Mediastinum/Nodes: There is mild inhomogeneity in the visualized thyroid without dominant thyroid mass evident. There is no evident thoracic adenopathy. Nasogastric tube extends into the stomach. There is no demonstrable thyroid wall thickening on this noncontrast enhanced study. Lungs/Pleura: The patient is intubated with endotracheal tube in the mid trachea. There is mild debris in the posterior mid trachea, possibly secondary to the intubation. No pneumothorax. There is scarring and pleural thickening in each lung apex. There is an area of opacity abutting the pleura in the anterior segment of the left upper lobe measuring 1.3 x 1.1 cm which now contains diffuse calcification. There is a nodular opacity in the lateral segment of the left lower lobe seen on axial slice 74 series 4 measuring 7 x 6 mm there are pleural effusions bilaterally with bibasilar atelectatic type change. There are areas of scarring in the right middle lobe and inferior lingular regions. There is a stable 3 mm nodular opacity in the inferior lingula seen on axial slice 95 series 4. There is a nodular appearing opacity in the superior segment of the left lower lobe seen on axial slice 86 series 4 measuring 5 x 5 mm which may represent localized atelectasis as opposed to in actual nodule. Which was not present on prior study. Upper Abdomen: In the visualized upper abdomen, there is atherosclerotic calcification in the aorta. Mild ascites is appreciable adjacent to the liver. Musculoskeletal: There are foci of degenerative change in the thoracic spine. There is  slight anterior wedging at T12 along the leftward aspect of superior endplate. This appearance is stable compared to prior CT examination. IMPRESSION: 1. Bilateral pleural effusions with bibasilar atelectasis. No well-defined airspace consolidation. 2. Previous nodular opacity abutting the pleura in the left upper lobe is now diffusely calcified and felt to represent benign etiology. There are new nodular opacities on the left, largest measuring 7 x 6 mm in the lateral segment left lower lobe. Non-contrast chest CT at 6-12 months is recommended. If the nodule is stable at time of repeat CT, then future CT at 18-24 months (from today's scan) is considered optional for low-risk patients, but is recommended for high-risk patients. This recommendation follows the consensus statement: Guidelines for Management of Incidental Pulmonary Nodules Detected on CT Images: From the Fleischner Society 2017; Radiology 2017; 284:228-243. 3.  No evident adenopathy. 4. Endotracheal tube tip in mid trachea. There is debris along the dependent portion of the trachea in this area, potentially due to intubation. Nasogastric  tube extends in the stomach. No pneumothorax. 5. There is aortic atherosclerosis. There are multiple foci of coronary artery calcification as well as foci of great vessel calcification. 6.  Mild upper abdominal ascites, incompletely visualized. Aortic Atherosclerosis (ICD10-I70.0). Electronically Signed   By: Lowella Grip III M.D.   On: 10/12/2017 11:09     STUDIES:  11/3 CT head :  There is low-density within the left internal and external capsule as described. Age indeterminate infarcts are suggested. Please note that the insular cortex is within normal limits and there is no evidence of hyperdense MCA. MRI may be helpful to further characterize.  CULTURES: 11/3 resp culture >  11/3 blood culture >  11/3 urine culture >   ANTIBIOTICS: 11/3 cipro/doxy > 11/4 11/4 levaquin >   SIGNIFICANT  EVENTS:   LINES/TUBES: 11/3 ETT >   DISCUSSION: 77 year old female with COPD found down on October 10, 2017 in the setting of acute hypercapnic respiratory failure.  Acute precipitant is not overt.   ASSESSMENT / PLAN:  PULMONARY A: Acute respiratory failure with hypercapnia COPD, acute exacerbation Community-acquired pneumonia P:   CT scan of the chest did not show acute pathology. She is clearly having episodes of central apnea.Will attempt to decrease FiO2 and repeat SBT, but suspect we will see the same result. She is not a candidate for central stimulants due to her chronic tachycardia. Will continue heparin for now as I don't want to do a contrast CT with her renal function. CARDIOVASCULAR A:  Sinus tachycardia Elevated d-dimer Demand ischemia P:  Telemetry monitoring Continue heparin today Follow-up echocardiogram Lower extremity Dopplers negative Increasing metoprolol Add aspirin  RENAL A:   Acute kidney injury, improving P:   Continue IV fluids, change LR to saline Monitor BMET and UOP Replace electrolytes as needed   GASTROINTESTINAL A:   No acute issues P:   Continue Pepcid for stress ulcer prophylaxis  HEMATOLOGIC A:   Elevated d-dimer, question venous thromboembolism P:  Continue heparin Dopplers of LE's negative. CTA of chest only if non-contrast CT does not show an explanation for wide aA gradient.  INFECTIOUS A:   Possible Community acquired pneumonia left upper lobe P:   CT chest pending today Follow-up respiratory cultures Change doxycycline to Levaquin in setting of penicillin allergy  ENDOCRINE A:   No acute issues P:   Monitor glucose  NEUROLOGIC A:   Acute encephalopathy secondary to hypercarbia Nonspecific low-density lesion in internal capsule P:   RASS goal: 0--1 Continue propofol per PAD protocol Will need MRI brain at some point   [place], MD [facility] Pager: [phone]  After 3pm or if no  response, call [phone]    10/13/2017, 8:47 AM

## 2017-10-14 LAB — BASIC METABOLIC PANEL
Anion gap: 9 (ref 5–15)
BUN: 27 mg/dL — AB (ref 6–20)
CALCIUM: 6.5 mg/dL — AB (ref 8.9–10.3)
CHLORIDE: 100 mmol/L — AB (ref 101–111)
CO2: 27 mmol/L (ref 22–32)
CREATININE: 1.09 mg/dL — AB (ref 0.44–1.00)
GFR calc Af Amer: 55 mL/min — ABNORMAL LOW (ref >60)
GFR calc non Af Amer: 48 mL/min — ABNORMAL LOW (ref >60)
GLUCOSE: 131 mg/dL — AB (ref 65–99)
Potassium: 4.1 mmol/L (ref 3.5–5.1)
Sodium: 136 mmol/L (ref 135–145)

## 2017-10-14 LAB — BLOOD GAS, ARTERIAL
Acid-Base Excess: 4.8 mmol/L — ABNORMAL HIGH (ref 0.0–2.0)
Bicarbonate: 29.5 mmol/L — ABNORMAL HIGH (ref 20.0–28.0)
DRAWN BY: 51191
FIO2: 30
O2 Saturation: 96.6 %
PATIENT TEMPERATURE: 98.6
PCO2 ART: 49.6 mmHg — AB (ref 32.0–48.0)
PEEP/CPAP: 5 cmH2O
PO2 ART: 88.9 mmHg (ref 83.0–108.0)
PRESSURE CONTROL: 15 cmH2O
RATE: 10 resp/min
pH, Arterial: 7.392 (ref 7.350–7.450)

## 2017-10-14 LAB — CBC
HEMATOCRIT: 31.3 % — AB (ref 36.0–46.0)
HEMOGLOBIN: 9.8 g/dL — AB (ref 12.0–15.0)
MCH: 29 pg (ref 26.0–34.0)
MCHC: 31.3 g/dL (ref 30.0–36.0)
MCV: 92.6 fL (ref 78.0–100.0)
Platelets: 133 10*3/uL — ABNORMAL LOW (ref 150–400)
RBC: 3.38 MIL/uL — ABNORMAL LOW (ref 3.87–5.11)
RDW: 14.1 % (ref 11.5–15.5)
WBC: 15.4 10*3/uL — ABNORMAL HIGH (ref 4.0–10.5)

## 2017-10-14 LAB — HEPARIN LEVEL (UNFRACTIONATED)
HEPARIN UNFRACTIONATED: 0.34 [IU]/mL (ref 0.30–0.70)
HEPARIN UNFRACTIONATED: 0.42 [IU]/mL (ref 0.30–0.70)

## 2017-10-14 LAB — GLUCOSE, CAPILLARY
GLUCOSE-CAPILLARY: 134 mg/dL — AB (ref 65–99)
GLUCOSE-CAPILLARY: 141 mg/dL — AB (ref 65–99)
GLUCOSE-CAPILLARY: 114 mg/dL — AB (ref 65–99)
GLUCOSE-CAPILLARY: 121 mg/dL — AB (ref 65–99)
Glucose-Capillary: 119 mg/dL — ABNORMAL HIGH (ref 65–99)
Glucose-Capillary: 173 mg/dL — ABNORMAL HIGH (ref 65–99)

## 2017-10-14 LAB — MAGNESIUM: Magnesium: 1.5 mg/dL — ABNORMAL LOW (ref 1.7–2.4)

## 2017-10-14 MED ORDER — LEVALBUTEROL HCL 0.63 MG/3ML IN NEBU
0.6300 mg | INHALATION_SOLUTION | Freq: Three times a day (TID) | RESPIRATORY_TRACT | Status: DC
  Administered 2017-10-14 – 2017-10-15 (×4): 0.63 mg via RESPIRATORY_TRACT
  Filled 2017-10-14 (×5): qty 3

## 2017-10-14 MED ORDER — METOPROLOL TARTRATE 50 MG PO TABS
50.0000 mg | ORAL_TABLET | Freq: Four times a day (QID) | ORAL | Status: DC
  Administered 2017-10-14 – 2017-10-20 (×23): 50 mg via ORAL
  Filled 2017-10-14 (×23): qty 1

## 2017-10-14 MED ORDER — LORAZEPAM 2 MG/ML IJ SOLN
0.5000 mg | Freq: Once | INTRAMUSCULAR | Status: AC
  Administered 2017-10-14: 0.5 mg via INTRAVENOUS
  Filled 2017-10-14: qty 1

## 2017-10-14 MED ORDER — METOPROLOL TARTRATE 5 MG/5ML IV SOLN
5.0000 mg | Freq: Once | INTRAVENOUS | Status: AC
  Administered 2017-10-14: 5 mg via INTRAVENOUS

## 2017-10-14 MED ORDER — METOPROLOL TARTRATE 5 MG/5ML IV SOLN
5.0000 mg | INTRAVENOUS | Status: DC | PRN
  Administered 2017-10-15 (×2): 5 mg via INTRAVENOUS
  Filled 2017-10-14 (×3): qty 5

## 2017-10-14 NOTE — Progress Notes (Signed)
PULMONARY / CRITICAL CARE MEDICINE   Name: Carol Carson MRN: [id number] DOB: Jul 01, 1941    ADMISSION DATE:  10/10/2017 CONSULTATION DATE: October 10, 2017  REFERRING MD: Dr. Alvino Chapel  CHIEF COMPLAINT: Found down, shortness of breath  HISTORY OF PRESENT ILLNESS:   76 year old female with a past medical history significant for COPD who was intubated in the [facility] emergency department on October 10, 2017 for hypercapnic respiratory failure.  She was initially hypothermic but subsequently had a temperature and an elevated lactate.  She was extensively volume loaded and empirically was started on Levaquin in addition to systemic steroids and bronchodilators.  She has remained ventilator dependent and failed an attempted SBT several times. On the last attempt she became overtly apneic. All opiates have been held and her FiO2 reduced and she was placed on an SBT again this am which she has tolerated for two hours. She has a chronic sinus tachycardia treated with beta blockers. 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  SUBJECTIVE: She is very alert and denying dyspnea this morning  VITAL SIGNS: BP 97/69 (BP Location: Left Leg)   Pulse (!) 101   Temp 98.3 F (36.8 C) (Axillary)   Resp (!) 21   Ht 5\' 5"  (1.651 m)   Wt 126 lb 5.2 oz (57.3 kg)   SpO2 99%   BMI 21.02 kg/m   HEMODYNAMICS:    VENTILATOR SETTINGS: Vent Mode: PCV FiO2 (%):  [30 %] 30 % Set Rate:  [10 bmp] 10 bmp PEEP:  [5 cmH20] 5 cmH20 Plateau Pressure:  [15 cmH20-19 cmH20] 18 cmH20  INTAKE / OUTPUT: I/O last 3 completed shifts: In: 6325.9 [I.V.:4637.4; NG/GT:1588.5; IV Piggyback:100] Out: 8645 [Urine:8645]  PHYSICAL EXAMINATION:  General: Elderly white female who is orally intubated and mechanically ventilated. Alert and calmly interactive PULM:   There is symmetric air movement scattered rhonchi no wheezes  CV:S1 and S2 regular without m g or rub GI: BS+, soft, nontender MSK: normal bulk and  tone No edema     LABS:  BMET Recent Labs  Lab 10/11/17 0423 10/12/17 0233 10/14/17 0050  NA 142 140 136  K 3.4* 3.2* 4.1  CL 104 104 100*  CO2 28 27 27   BUN 18 19 27*  CREATININE 1.39* 1.29* 1.09*  GLUCOSE 148* 175* 131*    Electrolytes Recent Labs  Lab 10/11/17 0423  10/12/17 0233 10/12/17 1642 10/13/17 0455 10/14/17 0050  CALCIUM 7.4*  --  7.0*  --   --  6.5*  MG 2.0   < > 1.7 1.4* 1.8 1.5*  PHOS <1.0*   < > 2.7 3.7 3.6  --    < > = values in this interval not displayed.    CBC Recent Labs  Lab 10/12/17 0233 10/13/17 0455 10/14/17 0050  WBC 13.7* 16.4* 15.4*  HGB 10.0* 10.0* 9.8*  HCT 32.1* 32.1* 31.3*  PLT 145* 143* 133*    Coag's Recent Labs  Lab 10/10/17 1011  INR 0.96    Sepsis Markers Recent Labs  Lab 10/10/17 1018 10/10/17 1638 10/10/17 1841 10/11/17 0423 10/12/17 0233  LATICACIDVEN 0.71 3.6* 4.1*  --   --   PROCALCITON  --  <0.10  --  0.24 0.17    ABG Recent Labs  Lab 10/10/17 2135 10/13/17 0420 10/14/17 0345  PHART 7.447 7.487* 7.392  PCO2ART 40.2 37.9 49.6*  PO2ART 102.0 118* 88.9    Liver Enzymes Recent Labs  Lab 10/10/17 1011 10/11/17 0423  AST 17 26  ALT 6* 9*  ALKPHOS 42 37*  BILITOT 0.4 0.5  ALBUMIN 3.1* 2.7*    Cardiac Enzymes Recent Labs  Lab 10/11/17 1155 10/11/17 1642 10/12/17 0014  TROPONINI 0.06* 0.06* 0.07*    Glucose Recent Labs  Lab 10/13/17 1248 10/13/17 1730 10/13/17 1946 10/13/17 2331 10/14/17 0348 10/14/17 0841  GLUCAP 158* 137* 158* 160* 173* 134*    Imaging No results found.   STUDIES:  11/3 CT head :  There is low-density within the left internal and external capsule as described. Age indeterminate infarcts are suggested. Please note that the insular cortex is within normal limits and there is no evidence of hyperdense MCA. MRI may be helpful to further characterize.  CULTURES: 11/3 resp culture >  11/3 blood culture >  11/3 urine culture >    ANTIBIOTICS: 11/3 cipro/doxy > 11/4 11/4 levaquin >   SIGNIFICANT EVENTS:   LINES/TUBES: 11/3 ETT >   DISCUSSION: 76 year old female with COPD found down on October 10, 2017 in the setting of acute hypercapnic respiratory failure.  Acute precipitant is not overt. She has a dilated RV which I suspect represent the consequences of severe central apnea with hypoxia. D-dimer was slightly elevated but dopplers negative. We have not done a CTA due to boarderline renal function.  ASSESSMENT / PLAN:  PULMONARY A: Acute respiratory failure with hypercapnia COPD, acute exacerbation Community-acquired pneumonia P:   CT scan of the chest did not show acute pathology. She is clearly having episodes of severe central apnea.All opiates have been held, keeping FiO2 as low as possible and attempting extubation this am. MRI for medullary lesion ordered. She needs a formal sleep study.   CARDIOVASCULAR A:  Sinus tachycardia Elevated d-dimer Demand ischemia P:  Telemetry monitoring Continue heparin today but I very much doubt a PE Lower extremity Dopplers negative Increasing metoprolol  RENAL A:   Acute kidney injury, improving P:   Continue IV fluids, change LR to saline Monitor BMET and UOP Replace electrolytes as needed   GASTROINTESTINAL A:   No acute issues P:   Continue Pepcid for stress ulcer prophylaxis  HEMATOLOGIC A:   Elevated d-dimer, question venous thromboembolism P:  Continue heparin Dopplers of LE's negative. CTA of chest if creatine stabilizes  INFECTIOUS A:   Possible Community acquired pneumonia left upper lobe P:   She has had 4 days of empiric tx for atypicals.   ENDOCRINE A:   No acute issues P:   Monitor glucose  NEUROLOGIC A:   Acute encephalopathy secondary to hypercarbia Nonspecific low-density lesion in internal capsule MRI today   Lars Masson, MD [facility] Pager: [phone]  After 3pm or if no response, call  [phone]    10/14/2017, 9:52 AM

## 2017-10-14 NOTE — Progress Notes (Signed)
ANTICOAGULATION CONSULT NOTE - Follow Up Consult  Pharmacy Consult for heparin Indication: possible PE  Labs: Recent Labs    10/11/17 1155  10/11/17 1642 10/12/17 0014  10/12/17 0233  10/13/17 0455 10/13/17 1414 10/14/17 0050 10/14/17 0900  HGB  --   --   --   --    < > 10.0*  --  10.0*  --  9.8*  --   HCT  --   --   --   --   --  32.1*  --  32.1*  --  31.3*  --   PLT  --   --   --   --   --  145*  --  143*  --  133*  --   HEPARINUNFRC  --    < > 0.71*  --   --  1.00*   < > 0.29* 0.24* 0.42 0.34  CREATININE  --   --   --   --   --  1.29*  --   --   --  1.09*  --   TROPONINI 0.06*  --  0.06* 0.07*  --   --   --   --   --   --   --    < > = values in this interval not displayed.    Assessment/Plan:  76yo female on heparin for possible PE. CT without contrast yesterday showed no acute pathology. Critical Care notes indicate continuing heparin infusion for now until able to obtain contrast CT once renal function improves.   Currently on IV heparin at 650 units/hr. Repeat HL remains therapeutic at 0.34. RN reports no s/s of bleeding   Goal of Therapy:  Heparin level 0.3-0.7 units/ml Monitor platelets by anticoagulation protocol: Yes  1. Increase heparin rate slightly to 700 units/hr.   2. Monitor daily HL and CBC, watch for s/sx of bleeding  Albertina Parr, PharmD., BCPS Clinical Pharmacist Pager [phone]

## 2017-10-14 NOTE — Progress Notes (Signed)
Notified Dr. Pearline Cables of pt's HR still not being controlled with multiple doses of metoprolol iv and one dose ativan iv.  He states, "just watch for now."

## 2017-10-14 NOTE — Progress Notes (Signed)
Positive cuff leak noted, pt extubated to 4lpm [place]. Patient verbal with no distress noted at this time.

## 2017-10-14 NOTE — Progress Notes (Signed)
 Progress Note  Patient Name: Carol Carson Date of Encounter: 10/14/2017  Primary Cardiologist: Turner/Allred/Camnitz  Subjective   Now extubated, calm.  Heart rate at 1:28 AM converted from SVT at 135 bpm to sinus tachycardia/sinus rhythm at 90 bpm.  No chest pain.  Inpatient Medications    Scheduled Meds: . aspirin  81 mg Per Tube Daily  . chlorhexidine gluconate (MEDLINE KIT)  15 mL Mouth Rinse BID  . insulin aspart  0-9 Units Subcutaneous Q4H  . ipratropium-albuterol  3 mL Nebulization TID  . mouth rinse  15 mL Mouth Rinse 10 times per day  . methylPREDNISolone (SOLU-MEDROL) injection  20 mg Intravenous Q12H  . metoprolol tartrate  50 mg Per Tube Q6H   Continuous Infusions: . sodium chloride 100 mL/hr at 10/14/17 0900  . dexmedetomidine (PRECEDEX) IV infusion 1.2 mcg/kg/hr (10/14/17 0900)  . famotidine (PEPCID) IV Stopped (10/14/17 1110)  . feeding supplement (VITAL AF 1.2 CAL) 1,000 mL (10/14/17 0900)  . heparin 650 Units/hr (10/14/17 0900)  . levofloxacin (LEVAQUIN) IV Stopped (10/12/17 1426)  . phenylephrine (NEO-SYNEPHRINE) Adult infusion Stopped (10/11/17 1230)   PRN Meds: albuterol, metoprolol tartrate, midazolam   Vital Signs    Vitals:   10/14/17 0700 10/14/17 0750 10/14/17 0800 10/14/17 0900  BP: (!) 166/77 (!) 178/87 97/69 (!) 143/63  Pulse: 96 99 (!) 101 (!) 107  Resp: 16 (!) 21 (!) 21 17  Temp:   98.3 F (36.8 C)   TempSrc:   Axillary   SpO2: 100% 99% 99% 97%  Weight:      Height:        Intake/Output Summary (Last 24 hours) at 10/14/2017 1050 Last data filed at 10/14/2017 0900 Gross per 24 hour  Intake 3883.48 ml  Output 5680 ml  Net -1796.52 ml   Filed Weights   10/12/17 0400 10/13/17 0500 10/14/17 0500  Weight: 136 lb 11 oz (62 kg) 132 lb 15 oz (60.3 kg) 126 lb 5.2 oz (57.3 kg)    Telemetry    Telemetry demonstrates at 1:28 AM conversion from SVT (no clear visible P wave) at 134 bpm to sinus rhythm 90 bpm with clear P waves  preceding QRS.- Personally Reviewed  ECG    Sinus tachycardia- Personally Reviewed  Physical Exam   GEN: Well nourished, well developed, in no acute distress  HEENT: normal  Neck: no JVD, carotid bruits, or masses Cardiac: RRR; no murmurs, rubs, or gallops,no edema  Respiratory:  clear to auscultation bilaterally, normal work of breathing GI: soft, nontender, nondistended, + BS MS: no deformity or atrophy  Skin: warm and dry, no rash Neuro:  Alert and Oriented x 3, Strength and sensation are intact Psych: euthymic mood, full affect   Labs    Chemistry Recent Labs  Lab 10/10/17 1011 10/11/17 0423 10/12/17 0233 10/14/17 0050  NA 138 142 140 136  K 5.1 3.4* 3.2* 4.1  CL 98* 104 104 100*  CO2 32 28 27 27  GLUCOSE 190* 148* 175* 131*  BUN 25* 18 19 27*  CREATININE 1.87* 1.39* 1.29* 1.09*  CALCIUM 8.2* 7.4* 7.0* 6.5*  PROT 5.7* 5.1*  --   --   ALBUMIN 3.1* 2.7*  --   --   AST 17 26  --   --   ALT 6* 9*  --   --   ALKPHOS 42 37*  --   --   BILITOT 0.4 0.5  --   --   GFRNONAA 25* 36* 39* 48*  GFRAA   29* 41* 45* 55*  ANIONGAP 8 10 9 9     Hematology Recent Labs  Lab 10/12/17 0233 10/13/17 0455 10/14/17 0050  WBC 13.7* 16.4* 15.4*  RBC 3.43* 3.48* 3.38*  HGB 10.0* 10.0* 9.8*  HCT 32.1* 32.1* 31.3*  MCV 93.6 92.2 92.6  MCH 29.2 28.7 29.0  MCHC 31.2 31.2 31.3  RDW 13.5 14.3 14.1  PLT 145* 143* 133*    Cardiac Enzymes Recent Labs  Lab 10/11/17 0423 10/11/17 1155 10/11/17 1642 10/12/17 0014  TROPONINI 0.04* 0.06* 0.06* 0.07*   No results for input(s): TROPIPOC in the last 168 hours.   BNPNo results for input(s): BNP, PROBNP in the last 168 hours.   DDimer  Recent Labs  Lab 10/10/17 1841  DDIMER 1.93*     Radiology    No results found.  Cardiac Studies   ECHO 10/12/17- normal EF  Patient Profile     76 y.o. female history of tachycardia, though sinus tachycardia as well as SVT on ventilator after being unresponsive at home.  Assessment &  Plan    Sinus tachycardia/PSVT -Converted at 1:50 AM as described above from SVT to sinus rhythm.  Continue with metoprolol 50 mg p.o. every 6 hours.  Plan to consolidate likely to 100 mg twice daily. - EF reassuringly normal left ventricular function - In the past she has been on flecainide 50 mg twice a day but this is currently on hold because she is on Levaquin.  If she can come off of the Levaquin, I think would be worth it to try her on the flecainide again.  The worry is interaction with Levaquin and prolonged QT.  Demand ischemia -Low level troponin increase likely a result of her current illness.  No changes, no chest pain  Central apnea -Challenging.  Currently extubated.  Monitoring.  For questions or updates, please contact [facility] Please consult [email or web address] for contact info under Cardiology/STEMI.      Signed, Mark Skains, MD  10/14/2017, 10:50 AM    

## 2017-10-14 NOTE — Progress Notes (Signed)
ANTICOAGULATION CONSULT NOTE - Follow Up Consult  Pharmacy Consult for heparin Indication: possible PE  Labs: Recent Labs    10/11/17 0423 10/11/17 1155 10/11/17 1642 10/12/17 0014 10/12/17 0233  10/13/17 0455 10/13/17 1414 10/14/17 0050  HGB 10.5*  --   --   --  10.0*  --  10.0*  --  9.8*  HCT 33.9*  --   --   --  32.1*  --  32.1*  --  31.3*  PLT 191  --   --   --  145*  --  143*  --  133*  HEPARINUNFRC 1.42*  --  0.71*  --  1.00*   < > 0.29* 0.24* 0.42  CREATININE 1.39*  --   --   --  1.29*  --   --   --   --   TROPONINI 0.04* 0.06* 0.06* 0.07*  --   --   --   --   --    < > = values in this interval not displayed.    Assessment/Plan:  76yo female therapeutic on heparin after rate changes. Will continue gtt at current rate and confirm stable with additional level.   Wynona Neat, PharmD, BCPS  10/14/2017,1:20 AM

## 2017-10-14 NOTE — Progress Notes (Signed)
Ordered Ativan given at this time.  Will continue to monitor pt's HR.  Pt has no c/o pain and no s/s of any acute distress.  Still anxious.

## 2017-10-14 NOTE — Progress Notes (Signed)
Pt's HR remains around 140.  Pt is breathing fine without any s/s of any acute distress.  Notified Dr. Pearline Cables of pt still being pretty tachycardic with some anxiety noted.  Ordered Ativan 0.5mg  iv once.

## 2017-10-14 NOTE — Progress Notes (Signed)
After receiving one prn dose of lopressor Iv, pts hr remains ST rate 140.  Notified Dr. Pearline Cables who orders another one time dose of metoprolol 5mg  iv at this time.

## 2017-10-15 ENCOUNTER — Other Ambulatory Visit: Payer: Self-pay

## 2017-10-15 ENCOUNTER — Inpatient Hospital Stay (HOSPITAL_COMMUNITY): Payer: Medicare Other

## 2017-10-15 ENCOUNTER — Encounter (HOSPITAL_COMMUNITY): Payer: Self-pay

## 2017-10-15 LAB — GLUCOSE, CAPILLARY
GLUCOSE-CAPILLARY: 142 mg/dL — AB (ref 65–99)
GLUCOSE-CAPILLARY: 103 mg/dL — AB (ref 65–99)
Glucose-Capillary: 85 mg/dL (ref 65–99)
Glucose-Capillary: 102 mg/dL — ABNORMAL HIGH (ref 65–99)

## 2017-10-15 LAB — BLOOD GAS, ARTERIAL
ACID-BASE EXCESS: 5.8 mmol/L — AB (ref 0.0–2.0)
BICARBONATE: 30.8 mmol/L — AB (ref 20.0–28.0)
DRAWN BY: 270271
O2 CONTENT: 3 L/min
O2 SAT: 96.2 %
PH ART: 7.379 (ref 7.350–7.450)
Patient temperature: 98.6
pCO2 arterial: 53.3 mmHg — ABNORMAL HIGH (ref 32.0–48.0)
pO2, Arterial: 82.5 mmHg — ABNORMAL LOW (ref 83.0–108.0)

## 2017-10-15 LAB — CBC WITH DIFFERENTIAL/PLATELET
BASOS PCT: 0 %
Basophils Absolute: 0 10*3/uL (ref 0.0–0.1)
EOS ABS: 0 10*3/uL (ref 0.0–0.7)
EOS PCT: 0 %
HCT: 32.1 % — ABNORMAL LOW (ref 36.0–46.0)
Hemoglobin: 10 g/dL — ABNORMAL LOW (ref 12.0–15.0)
LYMPHS ABS: 0.7 10*3/uL (ref 0.7–4.0)
Lymphocytes Relative: 4 %
MCH: 28.9 pg (ref 26.0–34.0)
MCHC: 31.2 g/dL (ref 30.0–36.0)
MCV: 92.8 fL (ref 78.0–100.0)
Monocytes Absolute: 0.7 10*3/uL (ref 0.1–1.0)
Monocytes Relative: 5 %
Neutro Abs: 14.4 10*3/uL — ABNORMAL HIGH (ref 1.7–7.7)
Neutrophils Relative %: 91 %
PLATELETS: 152 10*3/uL (ref 150–400)
RBC: 3.46 MIL/uL — AB (ref 3.87–5.11)
RDW: 13.9 % (ref 11.5–15.5)
WBC: 15.8 10*3/uL — AB (ref 4.0–10.5)

## 2017-10-15 LAB — CULTURE, BLOOD (ROUTINE X 2)
Culture: NO GROWTH
Culture: NO GROWTH
SPECIAL REQUESTS: ADEQUATE
Special Requests: ADEQUATE

## 2017-10-15 LAB — BASIC METABOLIC PANEL
Anion gap: 7 (ref 5–15)
BUN: 23 mg/dL — AB (ref 6–20)
CO2: 31 mmol/L (ref 22–32)
CREATININE: 1.14 mg/dL — AB (ref 0.44–1.00)
Calcium: 6.9 mg/dL — ABNORMAL LOW (ref 8.9–10.3)
Chloride: 98 mmol/L — ABNORMAL LOW (ref 101–111)
GFR, EST AFRICAN AMERICAN: 52 mL/min — AB (ref >60)
GFR, EST NON AFRICAN AMERICAN: 45 mL/min — AB (ref >60)
Glucose, Bld: 118 mg/dL — ABNORMAL HIGH (ref 65–99)
POTASSIUM: 4.5 mmol/L (ref 3.5–5.1)
SODIUM: 136 mmol/L (ref 135–145)

## 2017-10-15 LAB — HEPARIN LEVEL (UNFRACTIONATED): HEPARIN UNFRACTIONATED: 0.48 [IU]/mL (ref 0.30–0.70)

## 2017-10-15 MED ORDER — METOPROLOL TARTRATE 5 MG/5ML IV SOLN
2.5000 mg | Freq: Once | INTRAVENOUS | Status: AC
Start: 2017-10-15 — End: 2017-10-15
  Administered 2017-10-15: 2.5 mg via INTRAVENOUS

## 2017-10-15 MED ORDER — LEVALBUTEROL HCL 0.63 MG/3ML IN NEBU
0.6300 mg | INHALATION_SOLUTION | Freq: Two times a day (BID) | RESPIRATORY_TRACT | Status: DC
  Administered 2017-10-16 – 2017-10-17 (×3): 0.63 mg via RESPIRATORY_TRACT
  Filled 2017-10-15 (×3): qty 3

## 2017-10-15 MED ORDER — TECHNETIUM TO 99M ALBUMIN AGGREGATED
4.1000 | Freq: Once | INTRAVENOUS | Status: AC
  Administered 2017-10-15: 4.1 via INTRAVENOUS

## 2017-10-15 MED ORDER — TECHNETIUM TC 99M DIETHYLENETRIAME-PENTAACETIC ACID
31.4000 | Freq: Once | INTRAVENOUS | Status: AC
  Administered 2017-10-15: 31.4 via RESPIRATORY_TRACT

## 2017-10-15 NOTE — Progress Notes (Signed)
OT Cancellation Note  Patient Details Name: DONYE DAUENHAUER MRN: [id number] DOB: Sep 08, 1941   Cancelled Treatment:    Reason Eval/Treat Not Completed: Patient at procedure or test/ unavailable. Will follow up for OT eval as time allows.  Binnie Kand M.S., OTR/L Pager: [phone]  10/15/2017, 4:03 PM

## 2017-10-15 NOTE — Progress Notes (Signed)
Progress Note  Patient Name: Carol Carson Date of Encounter: 10/15/2017  Primary Cardiologist: Turner/Allred/Camnitz  Subjective   Sinus tachy. No CP, no SOB  Inpatient Medications    Scheduled Meds: . aspirin  81 mg Per Tube Daily  . chlorhexidine gluconate (MEDLINE KIT)  15 mL Mouth Rinse BID  . insulin aspart  0-9 Units Subcutaneous Q4H  . levalbuterol  0.63 mg Nebulization TID  . metoprolol tartrate  50 mg Oral Q6H   Continuous Infusions: . sodium chloride 100 mL/hr at 10/15/17 1100  . famotidine (PEPCID) IV Stopped (10/15/17 1144)  . feeding supplement (VITAL AF 1.2 CAL) Stopped (10/14/17 1100)  . heparin 700 Units/hr (10/15/17 1334)  . levofloxacin (LEVAQUIN) IV Stopped (10/14/17 1227)  . phenylephrine (NEO-SYNEPHRINE) Adult infusion Stopped (10/11/17 1230)   PRN Meds: metoprolol tartrate, midazolam   Vital Signs    Vitals:   10/15/17 1145 10/15/17 1204 10/15/17 1300 10/15/17 1330  BP: (!) 159/87  (!) 146/69 134/63  Pulse: (!) 130  92 96  Resp: 20  (!) 28 20  Temp:  99.4 F (37.4 C)    TempSrc:  Oral    SpO2: 100%  100% 100%  Weight:      Height:        Intake/Output Summary (Last 24 hours) at 10/15/2017 1358 Last data filed at 10/15/2017 1100 Gross per 24 hour  Intake 2546.5 ml  Output 3115 ml  Net -568.5 ml   Filed Weights   10/13/17 0500 10/14/17 0500 10/15/17 0429  Weight: 132 lb 15 oz (60.3 kg) 126 lb 5.2 oz (57.3 kg) 110 lb 10.7 oz (50.2 kg)    Telemetry    Now looks like sinus tach (prob atrial tach) abrupt onset and termination.  Personally Reviewed  ECG    Sinus tachycardia- Personally Reviewed  Physical Exam   GEN: Well nourished, well developed, in no acute distress  HEENT: normal  Neck: no JVD, carotid bruits, or masses Cardiac: RRR; no murmurs, rubs, or gallops,no edema  Respiratory:  clear to auscultation bilaterally, normal work of breathing GI: soft, nontender, nondistended, + BS MS: no deformity or atrophy  Skin:  warm and dry, no rash Neuro:  Alert and Oriented x 3, Strength and sensation are intact Psych: euthymic mood, full affect    Labs    Chemistry Recent Labs  Lab 10/10/17 1011 10/11/17 0423 10/12/17 0233 10/14/17 0050 10/15/17 0344  NA 138 142 140 136 136  K 5.1 3.4* 3.2* 4.1 4.5  CL 98* 104 104 100* 98*  CO2 32 _0 GLUCOSE 190* 148* 175* 131* 118*  BUN 25* 18 19 27* 23*  CREATININE 1.87* 1.39* 1.29* 1.09* 1.14*  CALCIUM 8.2* 7.4* 7.0* 6.5* 6.9*  PROT 5.7* 5.1*  --   --   --   ALBUMIN 3.1* 2.7*  --   --   --   AST 17 26  --   --   --   ALT 6* 9*  --   --   --   ALKPHOS 42 37*  --   --   --   BILITOT 0.4 0.5  --   --   --   GFRNONAA 25* 36* 39* 48* 45*  GFRAA 29* 41* 45* 55* 52*  ANIONGAP _1 Hematology Recent Labs  Lab 10/13/17 0455 10/14/17 0050 10/15/17 0344  WBC 16.4* 15.4* 15.8*  RBC 3.48* 3.38* 3.46*  HGB 10.0* 9.8* 10.0*  HCT 32.1* 31.3* 32.1*  MCV 92.2 92.6 92.8  MCH 28.7 29.0 28.9  MCHC 31.2 31.3 31.2  RDW 14.3 14.1 13.9  PLT 143* 133* 152    Cardiac Enzymes Recent Labs  Lab 10/11/17 0423 10/11/17 1155 10/11/17 1642 10/12/17 0014  TROPONINI 0.04* 0.06* 0.06* 0.07*   No results for input(s): TROPIPOC in the last 168 hours.   BNPNo results for input(s): BNP, PROBNP in the last 168 hours.   DDimer  Recent Labs  Lab 10/10/17 1841  DDIMER 1.93*     Radiology    Mr Brain Wo Contrast  Result Date: 10/15/2017 CLINICAL DATA:  Altered mental status EXAM: MRI HEAD WITHOUT CONTRAST TECHNIQUE: Multiplanar, multiecho pulse sequences of the brain and surrounding structures were obtained without intravenous contrast. COMPARISON:  Head CT 10/10/2017 FINDINGS: Brain: The midline structures are normal. There is no acute infarct or acute hemorrhage. No mass lesion, hydrocephalus, dural abnormality or extra-axial collection. There is multifocal white matter hyperintensity suggesting chronic ischemic microangiopathy. No age-advanced or  lobar predominant atrophy. No chronic microhemorrhage or superficial siderosis. Vascular: Major intracranial arterial and venous sinus flow voids are preserved. Skull and upper cervical spine: The visualized skull base, calvarium, upper cervical spine and extracranial soft tissues are normal. Sinuses/Orbits: No fluid levels or advanced mucosal thickening. No mastoid or middle ear effusion. Normal orbits. IMPRESSION: No acute intracranial abnormality. Sequelae of chronic ischemic microangiopathy. Electronically Signed   By: Ulyses Jarred M.D.   On: 10/15/2017 05:56    Cardiac Studies   ECHO 10/12/17- normal EF  Patient Profile     76 y.o. female history of tachycardia, though sinus tachycardia as well as SVT on ventilator after being unresponsive at home.  Assessment & Plan    Sinus tachycardia/PSVT  - Continue with metoprolol 50 mg p.o. every 6 hours.  Plan to consolidate likely to 100 mg twice daily.  - EF reassuringly normal left ventricular function - In the past she has been on flecainide 50 mg twice a day but this is currently on hold because she is on Levaquin.  If she can come off of the Levaquin, I think would be worth it to try her on the flecainide again.  The worry is interaction with Levaquin and prolonged QT. Same.   Demand ischemia -Low level troponin increase likely a result of her current illness.  No changes, no chest pain. No change  Central apnea -No longer noticing.   Going for VQ scan to hopefully exclude PE so that she can come off of heparin.   For questions or updates, please contact [facility] Please consult [email or web address] for contact info under Cardiology/STEMI.      Signed, Candee Furbish, MD  10/15/2017, 1:58 PM

## 2017-10-15 NOTE — Progress Notes (Signed)
PULMONARY / CRITICAL CARE MEDICINE   Name: Carol Carson MRN: [id number] DOB: 18-May-1941    ADMISSION DATE:  10/10/2017 CONSULTATION DATE: October 10, 2017  REFERRING MD: Dr. Alvino Chapel  CHIEF COMPLAINT: Found down, shortness of breath  HISTORY OF PRESENT ILLNESS:   76 year old female with a past medical history significant for COPD who was intubated in the [facility] emergency department on October 10, 2017 for hypercapnic respiratory failure.  She was initially hypothermic but subsequently had a temperature and an elevated lactate.  She was extensively volume loaded and empirically was started on Levaquin in addition to systemic steroids and bronchodilators.  She was difficult to wean and became overtly apneic on one attempt. All opiates were held and her FiO2 reduced and she was successfully extubated 11/7.  She has a chronic sinus tachycardia now being treated with beta blockers. 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  SUBJECTIVE: She is very alert and denying dyspnea this morning. She is sitting up in a chair reading. I asked about prior episodes of altered consciousness or syncope. 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 She has never had a sleep study VITAL SIGNS: BP (!) 163/81   Pulse (!) 104   Temp 98.7 F (37.1 C) (Oral)   Resp 18   Ht 5\' 5"  (1.651 m)   Wt 110 lb 10.7 oz (50.2 kg)   SpO2 99%   BMI 18.42 kg/m   HEMODYNAMICS:    VENTILATOR SETTINGS: FiO2 (%):  [30 %] 30 %  INTAKE / OUTPUT: I/O last 3 completed shifts: In: 4871.8 [I.V.:4101.8; NG/GT:720; IV Piggyback:50] Out: 7715 [WUJWJ:1914]  PHYSICAL EXAMINATION:  General: Elderly white female sitting in a chair in NAD PULM:   There is symmetric air movement scattered rhonchi no wheezes  CV:S1 and S2 regular without m g or rub GI: BS+, soft, nontender MSK: normal bulk and tone No edema     LABS:  BMET Recent Labs  Lab 10/12/17 0233 10/14/17 0050  10/15/17 0344  NA 140 136 136  K 3.2* 4.1 4.5  CL 104 100* 98*  CO2 27 27 31   BUN 19 27* 23*  CREATININE 1.29* 1.09* 1.14*  GLUCOSE 175* 131* 118*    Electrolytes Recent Labs  Lab 10/12/17 0233 10/12/17 1642 10/13/17 0455 10/14/17 0050 10/15/17 0344  CALCIUM 7.0*  --   --  6.5* 6.9*  MG 1.7 1.4* 1.8 1.5*  --   PHOS 2.7 3.7 3.6  --   --     CBC Recent Labs  Lab 10/13/17 0455 10/14/17 0050 10/15/17 0344  WBC 16.4* 15.4* 15.8*  HGB 10.0* 9.8* 10.0*  HCT 32.1* 31.3* 32.1*  PLT 143* 133* 152    Coag's Recent Labs  Lab 10/10/17 1011  INR 0.96    Sepsis Markers Recent Labs  Lab 10/10/17 1018 10/10/17 1638 10/10/17 1841 10/11/17 0423 10/12/17 0233  LATICACIDVEN 0.71 3.6* 4.1*  --   --   PROCALCITON  --  <0.10  --  0.24 0.17    ABG Recent Labs  Lab 10/13/17 0420 10/14/17 0345 10/15/17 0403  PHART 7.487* 7.392 7.379  PCO2ART 37.9 49.6* 53.3*  PO2ART 118* 88.9 82.5*    Liver Enzymes Recent Labs  Lab 10/10/17 1011 10/11/17 0423  AST 17 26  ALT 6* 9*  ALKPHOS 42 37*  BILITOT 0.4 0.5  ALBUMIN 3.1* 2.7*    Cardiac Enzymes Recent Labs  Lab 10/11/17 1155 10/11/17 1642 10/12/17 0014  TROPONINI 0.06* 0.06* 0.07*    Glucose Recent Labs  Lab 10/14/17 1136 10/14/17 1524 10/14/17 1953 10/14/17 2347 10/15/17 0321 10/15/17 0752  GLUCAP 141* 114* 121* 119* 142* 74    Imaging Mr Brain Wo Contrast  Result Date: 10/15/2017 CLINICAL DATA:  Altered mental status EXAM: MRI HEAD WITHOUT CONTRAST TECHNIQUE: Multiplanar, multiecho pulse sequences of the brain and surrounding structures were obtained without intravenous contrast. COMPARISON:  Head CT 10/10/2017 FINDINGS: Brain: The midline structures are normal. There is no acute infarct or acute hemorrhage. No mass lesion, hydrocephalus, dural abnormality or extra-axial collection. There is multifocal white matter hyperintensity suggesting chronic ischemic microangiopathy. No age-advanced or lobar  predominant atrophy. No chronic microhemorrhage or superficial siderosis. Vascular: Major intracranial arterial and venous sinus flow voids are preserved. Skull and upper cervical spine: The visualized skull base, calvarium, upper cervical spine and extracranial soft tissues are normal. Sinuses/Orbits: No fluid levels or advanced mucosal thickening. No mastoid or middle ear effusion. Normal orbits. IMPRESSION: No acute intracranial abnormality. Sequelae of chronic ischemic microangiopathy. Electronically Signed   By: Ulyses Jarred M.D.   On: 10/15/2017 05:56     STUDIES:  11/3 CT head :  There is low-density within the left internal and external capsule as described. Age indeterminate infarcts are suggested. Please note that the insular cortex is within normal limits and there is no evidence of hyperdense MCA. MRI may be helpful to further characterize.  CULTURES: 11/3 resp culture >  11/3 blood culture >  11/3 urine culture >   ANTIBIOTICS: 11/3 cipro/doxy > 11/4 11/4 levaquin >   SIGNIFICANT EVENTS:   LINES/TUBES: 11/3 ETT >   DISCUSSION: 76 year old female with COPD found down on October 10, 2017 in the setting of acute hypercapnic respiratory failure.  Acute precipitant is not overt. She has a dilated RV which I suspect represent the consequences of severe central apnea with hypoxia. D-dimer was slightly elevated but dopplers negative. We have not done a CTA due to boarderline renal function.  ASSESSMENT / PLAN:  PULMONARY A: Acute respiratory failure with hypercapnia COPD, acute exacerbation Community-acquired pneumonia P:   CT scan of the chest did not show acute pathology and MRI is unremarkable. She is clearly having episodes of severe central apnea. All opiates have been held, and keeping FiO2 as low as possible. She needs a formal sleep study.   CARDIOVASCULAR A:  Sinus tachycardia Elevated d-dimer  She has a dilated hypokinetic RV. She has a contraindication  for dye so will do V/Q scan today. Needs sleep study as well to evaluate RV overload. Also sending metanephrines to evaluate persistent refractory tachycardia   RENAL A:   Acute kidney injury, resolved  GasTROINTESTINAL A:   No acute issues P:   Continue Pepcid for stress ulcer prophylaxis  HEMATOLOGIC A:   Elevated d-dimer, question venous thromboembolism P:  Continue heparin Dopplers of LE's negative. V/Q today INFECTIOUS A:   Possible Community acquired pneumonia left upper lobe P:   She has had 5 days of empiric tx for atypicals.   ENDOCRINE A:   No acute issues P:   Monitor glucose  Lars Masson, MD  PCCM Pager: [phone]  After 3pm or if no response, call [phone]    10/15/2017, 10:37 AM

## 2017-10-15 NOTE — Evaluation (Signed)
Occupational Therapy Evaluation Patient Details Name: Carol Carson MRN: [id number] DOB: Nov 16, 1941 Today's Date: 10/15/2017    History of Present Illness 76 year old with O2 dependent COPD who was found unresponsive with respiratory failure and intubated 11/3-11/7. PMhx: renal insufficiency. O2 dependent CoPD, HTN, CA, restless leg syndrome   Clinical Impression   Pt reports she was independent with ADL PTA. Currently pt requires min assist for functional mobility with mod assist to correct multiple LOB during session. Pt overall min assist for ADL. Pt presenting with generalized weakness, decreased activity tolerance, poor safety awareness and attention to task, and poor balance impacting her independence and safety with ADL and functional mobility. Pt planning to d/c home alone. Recommending HHOT for follow up to maximize independence and safety with ADL and functional mobility upon return home. Pt would benefit from continued skilled OT to address established goals.    Follow Up Recommendations  Home health OT;Supervision/Assistance - 24 hour    Equipment Recommendations  3 in 1 bedside commode    Recommendations for Other Services       Precautions / Restrictions Precautions Precautions: Fall Precaution Comments: watch SpO2 Restrictions Weight Bearing Restrictions: No      Mobility Bed Mobility Overal bed mobility: Needs Assistance Bed Mobility: Rolling;Supine to Sit;Sit to Supine Rolling: Supervision   Supine to sit: Min assist Sit to supine: Min guard   General bed mobility comments: HHA to boost up into upright sitting  Transfers Overall transfer level: Needs assistance Equipment used: Rolling walker (2 wheeled) Transfers: Sit to/from Stand Sit to Stand: Min assist         General transfer comment: for steadying balance    Balance Overall balance assessment: Needs assistance Sitting-balance support: Feet supported;No upper extremity supported Sitting  balance-Leahy Scale: Fair     Standing balance support: Bilateral upper extremity supported Standing balance-Leahy Scale: Poor                             ADL either performed or assessed with clinical judgement   ADL Overall ADL's : Needs assistance/impaired Eating/Feeding: Set up;Sitting   Grooming: Set up;Supervision/safety;Sitting   Upper Body Bathing: Set up;Supervision/ safety;Sitting   Lower Body Bathing: Minimal assistance;Sit to/from stand   Upper Body Dressing : Set up;Supervision/safety;Sitting   Lower Body Dressing: Minimal assistance;Sit to/from stand   Toilet Transfer: Minimal assistance;Ambulation;BSC;RW Toilet Transfer Details (indicate cue type and reason): with multiple LOB requiring min-mod assist to correct Toileting- Clothing Manipulation and Hygiene: Minimal assistance;Sit to/from stand       Functional mobility during ADLs: Minimal assistance;Rolling walker;Moderate assistance       Vision         Perception     Praxis      Pertinent Vitals/Pain Pain Assessment: No/denies pain     Hand Dominance Right   Extremity/Trunk Assessment Upper Extremity Assessment Upper Extremity Assessment: Generalized weakness   Lower Extremity Assessment Lower Extremity Assessment: Defer to PT evaluation   Cervical / Trunk Assessment Cervical / Trunk Assessment: Normal   Communication Communication Communication: No difficulties   Cognition Arousal/Alertness: Awake/alert Behavior During Therapy: WFL for tasks assessed/performed Overall Cognitive Status: Impaired/Different from baseline Area of Impairment: Memory;Following commands;Safety/judgement;Awareness;Problem solving;Attention                   Current Attention Level: Selective Memory: Decreased short-term memory Following Commands: Follows one step commands consistently;Follows multi-step commands inconsistently Safety/Judgement: Decreased awareness of safety Awareness:  Emergent  Problem Solving: Difficulty sequencing;Requires verbal cues;Requires tactile cues General Comments: Repeating herself throughout session; reports she is just tired.   General Comments  VSS throughout on 3L supplemental O2    Exercises     Shoulder Instructions      Home Living Family/patient expects to be discharged to:: Private residence Living Arrangements: Alone   Type of Home: House Home Access: Stairs to enter Technical brewer of Steps: 1   Home Layout: One level     Bathroom Shower/Tub: Corporate investment banker: Handicapped height     Home Equipment: None          Prior Functioning/Environment Level of Independence: Independent                 OT Problem List: Decreased strength;Decreased activity tolerance;Impaired balance (sitting and/or standing);Decreased cognition;Decreased safety awareness;Decreased knowledge of use of DME or AE;Decreased knowledge of precautions;Cardiopulmonary status limiting activity      OT Treatment/Interventions: Self-care/ADL training;Energy conservation;DME and/or AE instruction;Therapeutic exercise;Therapeutic activities;Cognitive remediation/compensation;Patient/family education;Balance training    OT Goals(Current goals can be found in the care plan section) Acute Rehab OT Goals Patient Stated Goal: to return home OT Goal Formulation: With patient Time For Goal Achievement: 10/29/17 Potential to Achieve Goals: Good ADL Goals Pt Will Perform Grooming: with modified independence;standing Pt Will Perform Upper Body Bathing: with modified independence;sitting Pt Will Perform Lower Body Bathing: with modified independence;sit to/from stand Pt Will Transfer to Toilet: with modified independence;ambulating;bedside commode Pt Will Perform Toileting - Clothing Manipulation and hygiene: with modified independence;sit to/from stand Pt Will Perform Tub/Shower Transfer: with modified independence;Tub  transfer;ambulating;3 in 1;rolling walker  OT Frequency: Min 2X/week   Barriers to D/C: Decreased caregiver support  pt lives alone       Co-evaluation              AM-PAC PT "6 Clicks" Daily Activity     Outcome Measure Help from another person eating meals?: None Help from another person taking care of personal grooming?: A Little Help from another person toileting, which includes using toliet, bedpan, or urinal?: A Little Help from another person bathing (including washing, rinsing, drying)?: A Little Help from another person to put on and taking off regular upper body clothing?: A Little Help from another person to put on and taking off regular lower body clothing?: A Little 6 Click Score: 19   End of Session Equipment Utilized During Treatment: Rolling walker;Oxygen  Activity Tolerance: Patient tolerated treatment well Patient left: in bed;with call bell/phone within reach;with bed alarm set  OT Visit Diagnosis: Unsteadiness on feet (R26.81);Other abnormalities of gait and mobility (R26.89)                Time: 1635-1700 OT Time Calculation (min): 25 min Charges:  OT General Charges $OT Visit: 1 Visit OT Evaluation $OT Eval Moderate Complexity: 1 Mod OT Treatments $Self Care/Home Management : 8-22 mins G-Codes:     Jarid Sasso A. Ulice Brilliant, M.S., OTR/L Pager: [place] 10/15/2017, 5:10 PM

## 2017-10-15 NOTE — Evaluation (Signed)
Physical Therapy Evaluation Patient Details Name: Carol Carson MRN: [id number] DOB: 10-18-1941 Today's Date: 10/15/2017   History of Present Illness  76 year old with O2 dependent COPD who was found unresponsive with respiratory failure and intubated 11/3-11/7. PMhx: renal insufficiency. O2 dependent CoPD, HTN, CA, restless leg syndrome  Clinical Impression  Pt presents with generalized muscular weakness and decreased mobility secondary to above. Pt was pleased to get up and walking, although her arms and legs "felt a bit shaky". Pt tolerated treatment well and reports no c/o pain or SOB during ambulation or chair exercises. Pt is a good candidate for skilled PT in order to increase independence and improve mobility status.   Pt's SpO2 dropped to 81% on 4L O2 during ambulation, however remained at 100% on 6L.     Follow Up Recommendations Home health PT;Supervision/Assistance - 24 hour    Equipment Recommendations  3in1 (PT);Rolling walker with 5" wheels    Recommendations for Other Services       Precautions / Restrictions Precautions Precautions: Fall Precaution Comments: watch SpO2 Restrictions Weight Bearing Restrictions: No      Mobility  Bed Mobility               General bed mobility comments: Pt in chair.  Transfers Overall transfer level: Needs assistance Equipment used: Rolling walker (2 wheeled) Transfers: Sit to/from Stand Sit to Stand: Min assist;Min guard         General transfer comment: sit to stand x2, initial min assist and second min guard, cued pt to use UEs on chair to stand  Ambulation/Gait Ambulation/Gait assistance: Min guard Ambulation Distance (Feet): 150 Feet Assistive device: Rolling walker (2 wheeled) Gait Pattern/deviations: Step-through pattern;Decreased stride length;Trunk flexed Gait velocity: decreased   General Gait Details: Cued pt to walk inside of walker.  Stairs            Wheelchair Mobility    Modified  Rankin (Stroke Patients Only)       Balance Overall balance assessment: Needs assistance Sitting-balance support: Feet supported;No upper extremity supported Sitting balance-Leahy Scale: Fair Sitting balance - Comments: Pt able to perform dynamic sitting balance (reading and using both UEs for desk activities) with no UE external support.   Standing balance support: Bilateral upper extremity supported;During functional activity Standing balance-Leahy Scale: Poor                               Pertinent Vitals/Pain Pain Assessment: No/denies pain    Home Living Family/patient expects to be discharged to:: Private residence Living Arrangements: Alone Available Help at Discharge: Available 24 hours/day Type of Home: House Home Access: Stairs to enter   [employer] of Steps: 1 Home Layout: One level Home Equipment: None      Prior Function Level of Independence: Independent               Hand Dominance        Extremity/Trunk Assessment   Upper Extremity Assessment Upper Extremity Assessment: Defer to OT evaluation    Lower Extremity Assessment Lower Extremity Assessment: Generalized weakness;Overall [facility] for tasks assessed    Cervical / Trunk Assessment Cervical / Trunk Assessment: Normal  Communication   Communication: No difficulties  Cognition Arousal/Alertness: Awake/alert Behavior During Therapy: WFL for tasks assessed/performed Overall Cognitive Status: Within Functional Limits for tasks assessed  General Comments General comments (skin integrity, edema, etc.): During ambulatoin pt's SpO2 dropped to 81% on 4L, but able to maintain 100% on 6L.    Exercises General Exercises - Lower Extremity Quad Sets: 10 reps;AROM;Both;Seated Hip Flexion/Marching: AROM;10 reps;Both;Seated   Assessment/Plan    PT Assessment Patient needs continued PT services  PT Problem List Decreased  strength;Decreased mobility       PT Treatment Interventions DME instruction;Therapeutic activities;Gait training;Therapeutic exercise;Patient/family education;Stair training;Balance training;Functional mobility training;Neuromuscular re-education    PT Goals (Current goals can be found in the Care Plan section)  Acute Rehab PT Goals Patient Stated Goal: to return home PT Goal Formulation: With patient Time For Goal Achievement: 10/29/17 Potential to Achieve Goals: Good    Frequency Min 3X/week   Barriers to discharge        Co-evaluation               AM-PAC PT "6 Clicks" Daily Activity  Outcome Measure Difficulty turning over in bed (including adjusting bedclothes, sheets and blankets)?: None Difficulty moving from lying on back to sitting on the side of the bed? : None Difficulty sitting down on and standing up from a chair with arms (e.g., wheelchair, bedside commode, etc,.)?: A Little Help needed moving to and from a bed to chair (including a wheelchair)?: A Little Help needed walking in hospital room?: A Little Help needed climbing 3-5 steps with a railing? : A Lot 6 Click Score: 19    End of Session Equipment Utilized During Treatment: Gait belt Activity Tolerance: Patient tolerated treatment well Patient left: with call bell/phone within reach;in chair   PT Visit Diagnosis: Other abnormalities of gait and mobility (R26.89);Muscle weakness (generalized) (M62.81)    Time: 5188-4166 PT Time Calculation (min) (ACUTE ONLY): 24 min   Charges:   PT Evaluation $PT Eval Moderate Complexity: 1 Mod PT Treatments $Gait Training: 8-22 mins   PT G Codes:        Judee Clara, SPT  Judee Clara 10/15/2017, 11:15 AM

## 2017-10-15 NOTE — Progress Notes (Signed)
ANTICOAGULATION CONSULT NOTE - Follow Up Consult  Pharmacy Consult for heparin Indication: possible PE  Labs: Recent Labs    10/13/17 0455  10/14/17 0050 10/14/17 0900 10/15/17 0344  HGB 10.0*  --  9.8*  --  10.0*  HCT 32.1*  --  31.3*  --  32.1*  PLT 143*  --  133*  --  152  HEPARINUNFRC 0.29*   < > 0.42 0.34 0.48  CREATININE  --   --  1.09*  --  1.14*   < > = values in this interval not displayed.    Assessment/Plan:  76yo female on heparin for possible PE. CT without contrast yesterday showed no acute pathology. Critical Care notes indicate continuing heparin infusion for now until able to obtain contrast CT once renal function improves.   Heparin level therapeutic: 0.48, CBC stable, no signs/symptoms of bleeding documented  Goal of Therapy:  Heparin level 0.3-0.7 units/ml Monitor platelets by anticoagulation protocol: Yes  Continue heparin gtt at 700 units/hr    Monitor daily HL and CBC, watch for s/sx of bleeding  Georga Bora, PharmD Clinical Pharmacist 10/15/2017 7:52 AM

## 2017-10-16 DIAGNOSIS — J9691 Respiratory failure, unspecified with hypoxia: Secondary | ICD-10-CM

## 2017-10-16 DIAGNOSIS — J441 Chronic obstructive pulmonary disease with (acute) exacerbation: Principal | ICD-10-CM

## 2017-10-16 DIAGNOSIS — J9621 Acute and chronic respiratory failure with hypoxia: Secondary | ICD-10-CM

## 2017-10-16 LAB — CBC
HCT: 29 % — ABNORMAL LOW (ref 36.0–46.0)
Hemoglobin: 9.1 g/dL — ABNORMAL LOW (ref 12.0–15.0)
MCH: 29.3 pg (ref 26.0–34.0)
MCHC: 31.4 g/dL (ref 30.0–36.0)
MCV: 93.2 fL (ref 78.0–100.0)
PLATELETS: 142 10*3/uL — AB (ref 150–400)
RBC: 3.11 MIL/uL — ABNORMAL LOW (ref 3.87–5.11)
RDW: 13.9 % (ref 11.5–15.5)
WBC: 11.3 10*3/uL — ABNORMAL HIGH (ref 4.0–10.5)

## 2017-10-16 LAB — HEPARIN LEVEL (UNFRACTIONATED)

## 2017-10-16 MED ORDER — ALPRAZOLAM 0.25 MG PO TABS
0.2500 mg | ORAL_TABLET | Freq: Two times a day (BID) | ORAL | Status: DC | PRN
  Administered 2017-10-16 – 2017-10-19 (×6): 0.25 mg via ORAL
  Filled 2017-10-16 (×6): qty 1

## 2017-10-16 MED ORDER — ENSURE ENLIVE PO LIQD
237.0000 mL | ORAL | Status: DC
  Administered 2017-10-16 – 2017-10-19 (×2): 237 mL via ORAL

## 2017-10-16 NOTE — Progress Notes (Signed)
Occupational Therapy Treatment Patient Details Name: Carol Carson MRN: [id number] DOB: 1940-12-28 Today's Date: 10/16/2017    History of present illness 76 year old with O2 dependent COPD who was found unresponsive with respiratory failure and intubated 11/3-11/7. PMhx: renal insufficiency. O2 dependent CoPD, HTN, CA, restless leg syndrome   OT comments  Pt with improvements in balance and cognition from previous session but still with decreased safety awareness and requires min guard with RW for functional mobility. HR up to 145bpm with activity; SpO2 low-high 80s on 4L--cues for pursed lip breathing and standing rest breaks. Pt could benefit from further energy conservation education and HHOT for follow up. Will continue to follow acutely.   Follow Up Recommendations  Home health OT;Supervision/Assistance - 24 hour    Equipment Recommendations  3 in 1 bedside commode    Recommendations for Other Services      Precautions / Restrictions Precautions Precautions: Fall Precaution Comments: watch SpO2 Restrictions Weight Bearing Restrictions: No       Mobility Bed Mobility               General bed mobility comments: Pt OOB in chair upon arrival.  Transfers Overall transfer level: Needs assistance Equipment used: Rolling walker (2 wheeled) Transfers: Sit to/from Stand Sit to Stand: Min guard         General transfer comment: Cues for hand placement. Min guard for safety and balance    Balance Overall balance assessment: Needs assistance Sitting-balance support: Feet supported;No upper extremity supported Sitting balance-Leahy Scale: Good     Standing balance support: Bilateral upper extremity supported Standing balance-Leahy Scale: Poor Standing balance comment: RW for support                           ADL either performed or assessed with clinical judgement   ADL Overall ADL's : Needs assistance/impaired                          Toilet Transfer: Min guard;Ambulation           Functional mobility during ADLs: Min guard;Rolling walker General ADL Comments: HR up to 145bpm, SpO2 in low-high 80s on 4L supplemental O2 with activity. Cues for pursed lip breathing and standing rest breaks.     Vision       Perception     Praxis      Cognition Arousal/Alertness: Awake/alert Behavior During Therapy: WFL for tasks assessed/performed Overall Cognitive Status: Impaired/Different from baseline Area of Impairment: Safety/judgement;Awareness                         Safety/Judgement: Decreased awareness of safety Awareness: Emergent   General Comments: Improved from last session but still with decreased safety awareness with plan for return home.        Exercises     Shoulder Instructions       General Comments      Pertinent Vitals/ Pain       Pain Assessment: No/denies pain  Home Living                                          Prior Functioning/Environment              Frequency  Min 2X/week        Progress  Toward Goals  OT Goals(current goals can now be found in the care plan section)  Progress towards OT goals: Progressing toward goals  Acute Rehab OT Goals Patient Stated Goal: to return home OT Goal Formulation: With patient  Plan Discharge plan remains appropriate    Co-evaluation                 AM-PAC PT "6 Clicks" Daily Activity     Outcome Measure   Help from another person eating meals?: None Help from another person taking care of personal grooming?: A Little Help from another person toileting, which includes using toliet, bedpan, or urinal?: A Little Help from another person bathing (including washing, rinsing, drying)?: A Little Help from another person to put on and taking off regular upper body clothing?: A Little Help from another person to put on and taking off regular lower body clothing?: A Little 6 Click Score: 19     End of Session Equipment Utilized During Treatment: Rolling walker;Gait belt;Oxygen  OT Visit Diagnosis: Unsteadiness on feet (R26.81);Other abnormalities of gait and mobility (R26.89)   Activity Tolerance Patient tolerated treatment well   Patient Left in chair;with call bell/phone within reach;with chair alarm set   Nurse Communication Mobility status;Other (comment)(elevated HR)        Time: 7939-0300 OT Time Calculation (min): 18 min  Charges: OT General Charges $OT Visit: 1 Visit OT Treatments $Therapeutic Activity: 8-22 mins  Cian Costanzo A. Ulice Brilliant, M.S., OTR/L Pager: [place] 10/16/2017, 12:20 PM

## 2017-10-16 NOTE — Progress Notes (Signed)
Progress Note  Patient Name: Carol Carson Date of Encounter: 10/16/2017  Primary Cardiologist: Turner/Allred/Camnitz  Subjective   Patient endorses some chest tightness when she gets anxious but does not feel short of breath; denies chest pain or palpitations. She was able to ambulate a little yesterday and this morning and reports still feeling significantly weak.  Inpatient Medications    Scheduled Meds: . aspirin  81 mg Per Tube Daily  . chlorhexidine gluconate (MEDLINE KIT)  15 mL Mouth Rinse BID  . levalbuterol  0.63 mg Nebulization BID  . metoprolol tartrate  50 mg Oral Q6H   Continuous Infusions: . sodium chloride 100 mL/hr at 10/16/17 0900  . famotidine (PEPCID) IV Stopped (10/15/17 1144)  . feeding supplement (VITAL AF 1.2 CAL) Stopped (10/14/17 1100)  . levofloxacin (LEVAQUIN) IV Stopped (10/14/17 1227)  . phenylephrine (NEO-SYNEPHRINE) Adult infusion Stopped (10/11/17 1230)   PRN Meds: metoprolol tartrate, midazolam   Vital Signs    Vitals:   10/16/17 0700 10/16/17 0800 10/16/17 0837 10/16/17 0900  BP: 135/69 (!) 150/109  (!) 148/93  Pulse: (!) 109 (!) 122  (!) 135  Resp: (!) 22 (!) 30  20  Temp:  98.1 F (36.7 C)    TempSrc:  Oral    SpO2: 97% 96% 98% 98%  Weight:      Height:        Intake/Output Summary (Last 24 hours) at 10/16/2017 0933 Last data filed at 10/16/2017 0900 Gross per 24 hour  Intake 2942.5 ml  Output 2050 ml  Net 892.5 ml   Filed Weights   10/14/17 0500 10/15/17 0429 10/16/17 0500  Weight: 126 lb 5.2 oz (57.3 kg) 110 lb 10.7 oz (50.2 kg) 121 lb 4.1 oz (55 kg)    Telemetry    Sinus tach still with abrupt onset intermittent with episodes of SR in 80-90s. Personally Reviewed  ECG    None today.  Physical Exam   GEN: Well nourished, well developed, in no acute distress, sitting up in chair. HEENT: normal  Neck: no JVD Cardiac: RRR; no murmurs, rubs, or gallops,no edema  Respiratory: clear to auscultation bilaterally,  normal work of breathing GI: soft, nontender, nondistended, + BS MS: no deformity or atrophy  Skin: warm and dry, multiple ecchymoses throughout Neuro:  Alert and Oriented x 3, generalized weakness noted Psych: euthymic mood, full affect  Labs    Chemistry Recent Labs  Lab 10/10/17 1011 10/11/17 0423 10/12/17 0233 10/14/17 0050 10/15/17 0344  NA 138 142 140 136 136  K 5.1 3.4* 3.2* 4.1 4.5  CL 98* 104 104 100* 98*  CO2 32 '28 27 27 31  ' GLUCOSE 190* 148* 175* 131* 118*  BUN 25* 18 19 27* 23*  CREATININE 1.87* 1.39* 1.29* 1.09* 1.14*  CALCIUM 8.2* 7.4* 7.0* 6.5* 6.9*  PROT 5.7* 5.1*  --   --   --   ALBUMIN 3.1* 2.7*  --   --   --   AST 17 26  --   --   --   ALT 6* 9*  --   --   --   ALKPHOS 42 37*  --   --   --   BILITOT 0.4 0.5  --   --   --   GFRNONAA 25* 36* 39* 48* 45*  GFRAA 29* 41* 45* 55* 52*  ANIONGAP '8 10 9 9 7     ' Hematology Recent Labs  Lab 10/14/17 0050 10/15/17 0344 10/16/17 0225  WBC 15.4* 15.8*  11.3*  RBC 3.38* 3.46* 3.11*  HGB 9.8* 10.0* 9.1*  HCT 31.3* 32.1* 29.0*  MCV 92.6 92.8 93.2  MCH 29.0 28.9 29.3  MCHC 31.3 31.2 31.4  RDW 14.1 13.9 13.9  PLT 133* 152 142*    Cardiac Enzymes Recent Labs  Lab 10/11/17 0423 10/11/17 1155 10/11/17 1642 10/12/17 0014  TROPONINI 0.04* 0.06* 0.06* 0.07*   No results for input(s): TROPIPOC in the last 168 hours.   BNPNo results for input(s): BNP, PROBNP in the last 168 hours.   DDimer  Recent Labs  Lab 10/10/17 1841  DDIMER 1.93*     Radiology    Dg Chest 2 View  Result Date: 10/15/2017 CLINICAL DATA:  Respiratory failure. Pt denies chest pain. Hx of COPD and HTN. Former smoker. EXAM: CHEST  2 VIEW COMPARISON:  Chest x-ray dated 10/12/2017, 06/28/2015 and 01/13/2013. FINDINGS: Heart size and mediastinal contours are stable. Atherosclerotic changes noted at the aortic arch. Lungs are hyperexpanded. Stable nodular scarring/fibrosis in the left upper lobe. No evidence of pneumonia or pulmonary  edema. No pleural effusion or pneumothorax seen. No acute or suspicious osseous finding IMPRESSION: 1. No active cardiopulmonary disease. No evidence of pneumonia or pulmonary edema. 2. Aortic atherosclerosis. Electronically Signed   By: Franki Cabot M.D.   On: 10/15/2017 16:02   Mr Brain Wo Contrast  Result Date: 10/15/2017 CLINICAL DATA:  Altered mental status EXAM: MRI HEAD WITHOUT CONTRAST TECHNIQUE: Multiplanar, multiecho pulse sequences of the brain and surrounding structures were obtained without intravenous contrast. COMPARISON:  Head CT 10/10/2017 FINDINGS: Brain: The midline structures are normal. There is no acute infarct or acute hemorrhage. No mass lesion, hydrocephalus, dural abnormality or extra-axial collection. There is multifocal white matter hyperintensity suggesting chronic ischemic microangiopathy. No age-advanced or lobar predominant atrophy. No chronic microhemorrhage or superficial siderosis. Vascular: Major intracranial arterial and venous sinus flow voids are preserved. Skull and upper cervical spine: The visualized skull base, calvarium, upper cervical spine and extracranial soft tissues are normal. Sinuses/Orbits: No fluid levels or advanced mucosal thickening. No mastoid or middle ear effusion. Normal orbits. IMPRESSION: No acute intracranial abnormality. Sequelae of chronic ischemic microangiopathy. Electronically Signed   By: Ulyses Jarred M.D.   On: 10/15/2017 05:56   Nm Pulmonary Perf And Vent  Result Date: 10/15/2017 CLINICAL DATA:  Positive D-dimer with difficulty breathing EXAM: NUCLEAR MEDICINE VENTILATION - PERFUSION LUNG SCAN VIEWS: Anterior, posterior, left lateral, right lateral, RPO, LPO, RAO, LAO -ventilation and perfusion RADIOPHARMACEUTICALS:  31.4 mCi Technetium-3mDTPA aerosol inhalation and 4.1 mCi Technetium-917mAA IV COMPARISON:  Chest radiograph October 15, 2017 ; chest CT October 12, 2017 FINDINGS: Ventilation: There are near complete segmental areas of  photopenia involving anterior and posterior segments of each upper lobe. There are scattered subsegmental lower lobe areas of photopenia. Perfusion: On the perfusion study, there are photopenic areas in each upper lobe which match the ventilation defects. There are matching subsegmental defects in each lower lobe. There is no appreciable ventilation/perfusion mismatch. Chest radiograph and chest CT shows evidence suggesting emphysema without widespread bullous disease. IMPRESSION: There are multiple matching ventilation and profusion defects which for extensive in the upper lobes bilaterally and relatively minor elsewhere. There is no appreciable ventilation/ perfusion mismatch. There is underlying emphysematous change per chest radiograph and chest CT studies. The degree perfusion abnormality places the study in an intermediate category for pulmonary embolus. The lack of appreciable ventilation/perfusion mismatch places this study at the lower end of the spectrum of the intermediate category for  pulmonary embolus, at approximately 20% probability based on PIOPED II criteria. Electronically Signed   By: Lowella Grip III M.D.   On: 10/15/2017 16:00    Cardiac Studies   ECHO 10/12/17- normal EF  Patient Profile     76 y.o. female history of tachycardia, though sinus tachycardia as well as SVT on ventilator after being unresponsive at home. She has since been extubated successfully though still having episodes of ST.  Assessment & Plan    Sinus tachycardia/PSVT Continues to have abrupt onset of sinus tach; interestingly, as soon as I began speaking with patient and examining her, rates dropped to 90s, and when I exited room HR jumped back up to 130s. VQ scan was low-intermediate so heparin was stopped. --We may be able to switch to metoprolol 113m BID today. She is still receiving Levaquin for CAP; after completion we can restart her flecainide 567mBID.   For questions or updates, please contact  [facility] consult [email or web address] for contact info under Cardiology/STEMI.   Signed, GoAlphonzo GrieveMD  10/16/2017, 9:33 AM    Personally seen and examined. Agree with above.  About to walk with PT. Less SOB, no CP Tachy RR - Tele shows Sinus tach and NSR. Heparin off  Recs are to resume flecainide when completed course of Levaquin Continue with current metoprolol dosing. (if needed can consolidate to BID)  Will sign off. Call with ?  MaCandee FurbishMD

## 2017-10-16 NOTE — Progress Notes (Signed)
Nutrition Follow-up  INTERVENTION:   Ensure Enlive po at HS (strawberry), each supplement provides 350 kcal and 20 grams of protein  Magic cup TID with meals, each supplement provides 290 kcal and 9 grams of protein   NUTRITION DIAGNOSIS:   Inadequate oral intake related to decreased appetite as evidenced by meal completion < 50%, per patient/family report. Ongoing.   GOAL:   Patient will meet greater than or equal to 90% of their needs Progressing.   MONITOR:   PO intake, Supplement acceptance  ASSESSMENT:   Carol Carson has a PMH of O2 Dependent COPD, HLD, HTN, Endometrial and Uterine cancer, presents unresponsive with air trapping and CO2 retention, now intubated  Pt discussed during ICU rounds and with RN.   Meal Completion: 25% Per pt she is not very hungry. She did have a BM this am and that has helped a little.  Pt tried ensure a long time ago and did not like it but is willing to try Strawberry Ensure Enlive and magic cups.  Labs and meds reviewed.    Diet Order:  Diet regular Room service appropriate? Yes; Fluid consistency: Thin  EDUCATION NEEDS:   Not appropriate for education at this time  Skin:  Skin Assessment: Reviewed RN Assessment  Last BM:  11/9  Height:   Ht Readings from Last 1 Encounters:  10/10/17 5\' 5"  (1.651 m)    Weight:   Wt Readings from Last 1 Encounters:  10/16/17 121 lb 4.1 oz (55 kg)    Ideal Body Weight:  56.81 kg  BMI:  Body mass index is 20.18 kg/m.  Estimated Nutritional Needs:   Kcal:  1400-1600  Protein:  65-80 grams  Fluid:  >1.5L  Maylon Peppers RD, [place], [place] Pager [phone] After Hours Pager

## 2017-10-16 NOTE — Progress Notes (Signed)
PULMONARY / CRITICAL CARE MEDICINE   Name: Carol Carson MRN: [id number] DOB: [birth year]    ADMISSION DATE:  10/10/2017 CONSULTATION DATE: October 10, 2017  REFERRING MD: Dr. Alvino Chapel  CHIEF COMPLAINT: Found down, shortness of breath  HISTORY OF PRESENT ILLNESS:   76 year old female with a past medical history significant for COPD who was intubated in the [facility] emergency department on October 10, 2017 for hypercapnic respiratory failure.  She was initially hypothermic but subsequently had a temperature and an elevated lactate.  She was extensively volume loaded and empirically was started on Levaquin in addition to systemic steroids and bronchodilators.  She was difficult to wean and became overtly apneic on one attempt. All opiates were held and her FiO2 reduced and she was successfully extubated 11/7.  She has a chronic sinus tachycardia now being treated with beta blockers. 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 PA pressure was estimated at 35. Nuclear study 10/2016 did not show fixed or reversible ischemia. 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 D dimer this admission was positive, dopplers negative, and V/Q low prob  SUBJECTIVE: She is very alert and denying dyspnea again this morning. She is sitting up in a chair reading. She is very anxious   VITAL SIGNS: BP (!) 124/91   Pulse 99   Temp 98.1 F (36.7 C) (Oral)   Resp 19   Ht 5\' 5"  (1.651 m)   Wt 121 lb 4.1 oz (55 kg)   SpO2 97%   BMI 20.18 kg/m   HEMODYNAMICS:    VENTILATOR SETTINGS:    INTAKE / OUTPUT: I/O last 3 completed shifts: In: 4454.5 [P.O.:440; I.V.:3964.5; IV Piggyback:50] Out: 3220 [Urine:4115]  PHYSICAL EXAMINATION:  General: Elderly white female sitting in a chair in NAD PULM:   There is symmetric air movement scattered rhonchi no wheezes  CV:S1 and S2 regular without m g or rub GI: BS+, soft, nontender MSK: normal bulk and tone No edema     LABS:  BMET Recent Labs  Lab  10/12/17 0233 10/14/17 0050 10/15/17 0344  NA 140 136 136  K 3.2* 4.1 4.5  CL 104 100* 98*  CO2 27 27 31   BUN 19 27* 23*  CREATININE 1.29* 1.09* 1.14*  GLUCOSE 175* 131* 118*    Electrolytes Recent Labs  Lab 10/12/17 0233 10/12/17 1642 10/13/17 0455 10/14/17 0050 10/15/17 0344  CALCIUM 7.0*  --   --  6.5* 6.9*  MG 1.7 1.4* 1.8 1.5*  --   PHOS 2.7 3.7 3.6  --   --     CBC Recent Labs  Lab 10/14/17 0050 10/15/17 0344 10/16/17 0225  WBC 15.4* 15.8* 11.3*  HGB 9.8* 10.0* 9.1*  HCT 31.3* 32.1* 29.0*  PLT 133* 152 142*    Coag's Recent Labs  Lab 10/10/17 1011  INR 0.96    Sepsis Markers Recent Labs  Lab 10/10/17 1018 10/10/17 1638 10/10/17 1841 10/11/17 0423 10/12/17 0233  LATICACIDVEN 0.71 3.6* 4.1*  --   --   PROCALCITON  --  <0.10  --  0.24 0.17    ABG Recent Labs  Lab 10/13/17 0420 10/14/17 0345 10/15/17 0403  PHART 7.487* 7.392 7.379  PCO2ART 37.9 49.6* 53.3*  PO2ART 118* 88.9 82.5*    Liver Enzymes Recent Labs  Lab 10/10/17 1011 10/11/17 0423  AST 17 26  ALT 6* 9*  ALKPHOS 42 37*  BILITOT 0.4 0.5  ALBUMIN 3.1* 2.7*    Cardiac Enzymes Recent Labs  Lab 10/11/17  1155 10/11/17 1642 10/12/17 0014  TROPONINI 0.06* 0.06* 0.07*    Glucose Recent Labs  Lab 10/14/17 1953 10/14/17 2347 10/15/17 0321 10/15/17 0752 10/15/17 1144 10/15/17 1613  GLUCAP 121* 119* 142* 85 103* 102*    Imaging Dg Chest 2 View  Result Date: 10/15/2017 CLINICAL DATA:  Respiratory failure. Pt denies chest pain. Hx of COPD and HTN. Former smoker. EXAM: CHEST  2 VIEW COMPARISON:  Chest x-ray dated 10/12/2017, 06/28/2015 and 01/13/2013. FINDINGS: Heart size and mediastinal contours are stable. Atherosclerotic changes noted at the aortic arch. Lungs are hyperexpanded. Stable nodular scarring/fibrosis in the left upper lobe. No evidence of pneumonia or pulmonary edema. No pleural effusion or pneumothorax seen. No acute or suspicious osseous finding  IMPRESSION: 1. No active cardiopulmonary disease. No evidence of pneumonia or pulmonary edema. 2. Aortic atherosclerosis. Electronically Signed   By: Franki Cabot M.D.   On: 10/15/2017 16:02   Nm Pulmonary Perf And Vent  Result Date: 10/15/2017 CLINICAL DATA:  Positive D-dimer with difficulty breathing EXAM: NUCLEAR MEDICINE VENTILATION - PERFUSION LUNG SCAN VIEWS: Anterior, posterior, left lateral, right lateral, RPO, LPO, RAO, LAO -ventilation and perfusion RADIOPHARMACEUTICALS:  31.4 mCi Technetium-63m DTPA aerosol inhalation and 4.1 mCi Technetium-31m MAA IV COMPARISON:  Chest radiograph October 15, 2017 ; chest CT October 12, 2017 FINDINGS: Ventilation: There are near complete segmental areas of photopenia involving anterior and posterior segments of each upper lobe. There are scattered subsegmental lower lobe areas of photopenia. Perfusion: On the perfusion study, there are photopenic areas in each upper lobe which match the ventilation defects. There are matching subsegmental defects in each lower lobe. There is no appreciable ventilation/perfusion mismatch. Chest radiograph and chest CT shows evidence suggesting emphysema without widespread bullous disease. IMPRESSION: There are multiple matching ventilation and profusion defects which for extensive in the upper lobes bilaterally and relatively minor elsewhere. There is no appreciable ventilation/ perfusion mismatch. There is underlying emphysematous change per chest radiograph and chest CT studies. The degree perfusion abnormality places the study in an intermediate category for pulmonary embolus. The lack of appreciable ventilation/perfusion mismatch places this study at the lower end of the spectrum of the intermediate category for pulmonary embolus, at approximately 20% probability based on PIOPED II criteria. Electronically Signed   By: Lowella Grip III M.D.   On: 10/15/2017 16:00     STUDIES:  11/3 CT head :  There is low-density within  the left internal and external capsule as described. Age indeterminate infarcts are suggested. Please note that the insular cortex is within normal limits .  CULTURES: 11/3 resp culture >  11/3 blood culture >  11/3 urine culture >   ANTIBIOTICS: 11/3 cipro/doxy > 11/4 11/4 levaquin > 11/9  SIGNIFICANT EVENTS:   LINES/TUBES: 11/3 ETT >   DISCUSSION: 76 year old female with COPD found down on October 10, 2017 in the setting of acute hypercapnic respiratory failure.  Acute precipitant is not overt. She has a dilated RV which I suspect represent the consequences of severe central apnea with hypoxia. D-dimer was slightly elevated but dopplers negative. We have not done a CTA due to intermittent  boarderline renal function, but V/q was not remarkable and anticoagulation was discontinued.  ASSESSMENT / PLAN:  PULMONARY A: Acute respiratory failure with hypercapnia COPD, acute exacerbation    CT scan of the chest did not show acute pathology and MRI is unremarkable. She is clearly having episodes of severe central apnea. All opiates have been held, and keeping FiO2 as low as  possible. I did allow a very small dose of Xanax as she is very anxious. She needs a formal sleep study. Observing today off antibiotics, off steroids, and using only Xopenex for bronchospasm out of respect for her chronic tachycardia     CARDIOVASCULAR A:  Sinus tachycardia Elevated d-dimer  She has a dilated hypokinetic RV which may entirely explain the tachycardia. Metanephrines are pending. tachycardia   RENAL A:   Acute kidney injury, resolved   Lars Masson, MD [place] [facility] Pager: [phone]  After 3pm or if no response, call [phone]    10/16/2017, 10:55 AM

## 2017-10-16 NOTE — Care Management Note (Addendum)
Case Management Note  Patient Details  Name: Carol Carson MRN: [id number] Date of Birth: 03-11-1941  Subjective/Objective:    Pt admitted on 10/10/17 with hypercapnic respiratory failure.  She has COPD, and is on chronic home oxygen, [facility].  PTA, pt independent, lives alone.                  Action/Plan: Met with pt to discuss dc plans.  PT/OT recommending HH follow up.  Pt states that son, daughter in law and granddaughter are able to assist at dc, and even stay with her at home, if necessary.  We did discuss PT/OT recommendation of 24h supervision at first when dc, and pt states she may go stay with her son/D-I-L for a while.  I provided pt a  list of [facility] providers, but she is not sure she wants HH at this time.  She states that she will definitely consider it.  CM will follow up with pt regarding [place] prior to dc.    Expected Discharge Date:                  Expected Discharge Plan:     In-House Referral:     Discharge planning Services  CM Consult  Post Acute Care Choice:    Choice offered to:     DME Arranged:    DME Agency:     HH Arranged:    HH Agency:     Status of Service:  In process, will continue to follow  If discussed at Long Length of Stay Meetings, dates discussed:    Additional Comments:  Reinaldo Raddle, RN, BSN  Trauma/Neuro ICU Case Manager [phone]

## 2017-10-17 DIAGNOSIS — R000 Tachycardia, unspecified: Secondary | ICD-10-CM

## 2017-10-17 DIAGNOSIS — R5381 Other malaise: Secondary | ICD-10-CM

## 2017-10-17 LAB — CBC
HCT: 29.6 % — ABNORMAL LOW (ref 36.0–46.0)
HEMOGLOBIN: 9.2 g/dL — AB (ref 12.0–15.0)
MCH: 29.2 pg (ref 26.0–34.0)
MCHC: 31.1 g/dL (ref 30.0–36.0)
MCV: 94 fL (ref 78.0–100.0)
Platelets: 132 10*3/uL — ABNORMAL LOW (ref 150–400)
RBC: 3.15 MIL/uL — ABNORMAL LOW (ref 3.87–5.11)
RDW: 13.7 % (ref 11.5–15.5)
WBC: 7.7 10*3/uL (ref 4.0–10.5)

## 2017-10-17 MED ORDER — BISOPROLOL FUMARATE 5 MG PO TABS: 5.0000 mg | ORAL_TABLET | Freq: Every day | ORAL | Status: DC

## 2017-10-17 MED ORDER — LEVALBUTEROL HCL 0.63 MG/3ML IN NEBU: 0.6300 mg | INHALATION_SOLUTION | RESPIRATORY_TRACT | Status: DC | PRN

## 2017-10-17 MED ORDER — IPRATROPIUM BROMIDE 0.02 % IN SOLN
0.5000 mg | Freq: Four times a day (QID) | RESPIRATORY_TRACT | Status: DC
  Administered 2017-10-17 – 2017-10-18 (×3): 0.5 mg via RESPIRATORY_TRACT
  Filled 2017-10-17 (×3): qty 2.5

## 2017-10-17 MED ORDER — FLECAINIDE ACETATE 50 MG PO TABS
50.0000 mg | ORAL_TABLET | Freq: Two times a day (BID) | ORAL | Status: DC
  Administered 2017-10-17 – 2017-10-20 (×7): 50 mg via ORAL
  Filled 2017-10-17 (×7): qty 1

## 2017-10-17 MED ORDER — CHLORHEXIDINE GLUCONATE 0.12 % MT SOLN
OROMUCOSAL | Status: AC
  Filled 2017-10-17: qty 15

## 2017-10-17 MED ORDER — LEVALBUTEROL HCL 0.63 MG/3ML IN NEBU
0.6300 mg | INHALATION_SOLUTION | Freq: Four times a day (QID) | RESPIRATORY_TRACT | Status: DC
  Administered 2017-10-17 – 2017-10-18 (×3): 0.63 mg via RESPIRATORY_TRACT
  Filled 2017-10-17 (×3): qty 3

## 2017-10-17 NOTE — Progress Notes (Signed)
Tried to call report x1

## 2017-10-17 NOTE — Assessment & Plan Note (Signed)
-

## 2017-10-17 NOTE — Assessment & Plan Note (Signed)
HR 100s  Plan Restart home  flecanidie Continue lopressor (on atenolol) Cards to adjust futher

## 2017-10-17 NOTE — Progress Notes (Signed)
Occupational Therapy Treatment Patient Details Name: Carol Carson MRN: [id number] DOB: [birth year] Today's Date: 10/17/2017    History of present illness 76 year old with O2 dependent COPD who was found unresponsive with respiratory failure and intubated 11/3-11/7. PMhx: renal insufficiency. O2 dependent CoPD, HTN, CA, restless leg syndrome   OT comments  Pt demonstrates improving endurance.  She is able to perform ADLs with min guard assist.  02 sats decreased to 85% on 3L supplemental 02 during activity, but increased to 93% on 4L.  She plans to discharge home with intermittent assist from family (lives alone).  Will benefit from further OT to maximize safety and independence as well as to review energy conservation techniques   Follow Up Recommendations  Home health OT;Supervision/Assistance - 24 hour    Equipment Recommendations  3 in 1 bedside commode    Recommendations for Other Services      Precautions / Restrictions Precautions Precautions: Fall Precaution Comments: watch SpO2       Mobility Bed Mobility                  Transfers Overall transfer level: Needs assistance Equipment used: Rolling walker (2 wheeled) Transfers: Sit to/from [facility] Sit to Stand: Min guard         General transfer comment: verbal cues for walker safety     Balance Overall balance assessment: Needs assistance Sitting-balance support: Feet supported;No upper extremity supported Sitting balance-Leahy Scale: Good     Standing balance support: Bilateral upper extremity supported;During functional activity Standing balance-Leahy Scale: Poor Standing balance comment: reliant on Ue support                            ADL either performed or assessed with clinical judgement   ADL Overall ADL's : Needs assistance/impaired     Grooming: Wash/dry hands;Wash/dry face;Oral care;Brushing hair;Min guard;Standing       Lower Body Bathing: Min  guard;Sit to/from stand       Lower Body Dressing: Min guard;Sit to/from stand   Toilet Transfer: Min guard;Ambulation;Comfort height toilet;RW;Grab bars   Toileting- Clothing Manipulation and Hygiene: Min guard;Sit to/from stand       Functional mobility during ADLs: Min guard;Rolling walker General ADL Comments: 02 sats 85% on 3L supplemental 02, sats improved to 93% on 4L 02 during activity      Vision       Perception     Praxis      Cognition Arousal/Alertness: Awake/alert Behavior During Therapy: WFL for tasks assessed/performed Overall Cognitive Status: Impaired/Different from baseline Area of Impairment: Safety/judgement;Problem solving                         Safety/Judgement: Decreased awareness of safety   Problem Solving: Requires verbal cues General Comments: Requires min cues for safety awareness         Exercises     Shoulder Instructions       General Comments Pt instructed to remove area rugs from home, and options for walker bag or basket     Pertinent Vitals/ Pain       Pain Assessment: No/denies pain  Home Living                                          Prior Functioning/Environment  Frequency  Min 2X/week        Progress Toward Goals  OT Goals(current goals can now be found in the care plan section)  Progress towards OT goals: Progressing toward goals     Plan Discharge plan remains appropriate    Co-evaluation                 AM-PAC PT "6 Clicks" Daily Activity     Outcome Measure   Help from another person eating meals?: None Help from another person taking care of personal grooming?: A Little Help from another person toileting, which includes using toliet, bedpan, or urinal?: A Little Help from another person bathing (including washing, rinsing, drying)?: A Little Help from another person to put on and taking off regular upper body clothing?: A Little Help from  another person to put on and taking off regular lower body clothing?: A Little 6 Click Score: 19    End of Session Equipment Utilized During Treatment: Rolling walker;Oxygen  OT Visit Diagnosis: Unsteadiness on feet (R26.81);Other abnormalities of gait and mobility (R26.89)   Activity Tolerance Patient limited by fatigue   Patient Left in chair;with call bell/phone within reach;with chair alarm set   Nurse Communication Mobility status        Time: 7591-6384 OT Time Calculation (min): 24 min  Charges: OT General Charges $OT Visit: 1 Visit OT Treatments $Self Care/Home Management : 23-37 mins  [facility], OTR/L [phone]    Lucille Passy M 10/17/2017, 5:02 PM

## 2017-10-17 NOTE — Assessment & Plan Note (Signed)
She is improved. In retrospect no hx of AECOPD but presented like that. Improve dwithout steroids or antibiotics  Plan Continue o2 baseline 3LNC Add scheduled BD - xopenex and atrovent (ANORO at home) Hold off steroids but need to talk to her about it - atleast ICS Check RVP

## 2017-10-17 NOTE — Progress Notes (Signed)
Patient transported to [address] [place] on wheel chair by hospital staff.

## 2017-10-17 NOTE — Progress Notes (Signed)
Patient is being transferred to [place]. Report called to the receiving RN. Patient's brother called to notify that patient is been transferred.

## 2017-10-17 NOTE — Assessment & Plan Note (Signed)
Will hold off on abx and steroid Rx given improvement though this might have been the inciting cause

## 2017-10-17 NOTE — Assessment & Plan Note (Signed)
Resolved. Hyprecapnia only issue found as etiology

## 2017-10-17 NOTE — Progress Notes (Signed)
PULMONARY / CRITICAL CARE MEDICINE   Name: Carol Carson MRN: [id number] DOB: 03/31/1941 PCP Leeroy Cha, MD LOS 7 as of 10/17/2017     ADMISSION DATE:  10/10/2017 CONSULTATION DATE:  10/10/2017   REFERRING MD:  Alvino Chapel  CHIEF COMPLAINT:  Found down  BRIEF   76 year old female with a past medical history significant for COPD who was intubated in the [facility] emergency department on October 10, 2017 for hypercapnic respiratory failure.  She was initially hypothermic but subsequently had a temperature and an elevated lactate.  She was extensively volume loaded and empirically was started on Levaquin in addition to systemic steroids and bronchodilators.  She was difficult to wean and became overtly apneic on one attempt. All opiates were held and her FiO2 reduced and she was successfully extubated 11/7.  She has a chronic sinus tachycardia now being treated with beta blockers. 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 PA pressure was estimated at 35. Nuclear study 10/2016 did not show fixed or reversible ischemia. 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 D dimer this admission was positive, dopplers negative, and V/Q low prob  EVENTS 10/10/2017 - admit 11/5 - CT chest - Previous nodular opacity abutting the pleura in the left upper lobe is now diffusely calcified and felt to represent benign etiology. There are new nodular opacities on the left, largest measuring 7 x 6 mm in the lateral segment left lower lobe. Non-contrast chest CT at 6-12 months is recommended. If the nodule is stable at time of repeat CT, then future CT at 18-24 months (from today's scan) is considered optional for low-risk patients, but is recommended for high-risk patients. This recommendation follows the consensus statement: Guidelines for Management of Incidental Pulmonary Nodules Detected on CT Images: From the Fleischner Society 2017; Radiology 2017; 580:998-338.  11/7 - extubated  11/8  - MRI brain - chroic ischemic changes only  10/16/17 - She is very alert and denying dyspnea again this morning. She is sitting up in a chair reading. She is very anxious    SUBJECTIVE/OVERNIGHT/INTERVAL HX 11/10 - remains extubated. RN says patient off levaquin and should be able to go to po flecanide.  Current HR 128/min. Sitting and watching TV. RN think patient is transferrable. Ambulated 10/17/2017/ Cards 11/9 ok with flecanide restart after completiopn of levaquin/.   Now on 3L [place] for her baseline COPD - baseline MW patient . Swsitched to MR from Aug 2018. On ANORO. CAT score 18 in August. Patient reports that on day proper admit had done some extra household work becuas sisters were coming. It was they who found her unresponsive. Denies AECOPD symptoms  D dimer this admission was positive, dopplers negative, and V/Q low prob     VITAL SIGNS: BP (!) 128/58   Pulse (!) 137   Temp 98 F (36.7 C) (Oral)   Resp (!) 23   Ht 5\' 5"  (1.651 m)   Wt 53.8 kg (118 lb 9.7 oz)   SpO2 100%   BMI 19.74 kg/m   HEMODYNAMICS:    VENTILATOR SETTINGS:    INTAKE / OUTPUT: I/O last 3 completed shifts: In: 2320 [P.O.:920; I.V.:1400] Out: 2050 [Urine:2050]     EXAM  General Appearance:    Looks well. Watching TV sitting in chair  Head:    Normocephalic, without obvious abnormality, atraumatic  Eyes:    PERRL - yes, conjunctiva/corneas - clear      Ears:    Normal external ear canals, both ears  Nose:   NG tube - no. O2 on  Throat:  ETT TUBE - no , OG tube - no  Neck:   Supple,  No enlargement/tenderness/nodules     Lungs:     Clear to auscultation bilaterally,   Chest wall:    No deformity  Heart:    S1 and S2 normal, no murmur, CVP - no.  Pressors - no  Abdomen:     Soft, no masses, no organomegaly  Genitalia:    Not done  Rectal:   not done  Extremities:   Extremities- intact     Skin:   Intact in exposed areas . Sacral area - no decub     Neurologic:   Sedation - non ->  RASS - 0 . Moves all 4s - yes. CAM-ICU - neg for delirum . Orientation -  X 3 +       LABS  PULMONARY Recent Labs  Lab 10/10/17 1621 10/10/17 2135 10/13/17 0420 10/14/17 0345 10/15/17 0403  PHART 7.197* 7.447 7.487* 7.392 7.379  PCO2ART 61.4* 40.2 37.9 49.6* 53.3*  PO2ART 64.0* 102.0 118* 88.9 82.5*  HCO3 24.9 27.7 28.5* 29.5* 30.8*  TCO2 27 29  --   --   --   O2SAT 91.0 98.0 98.9 96.6 96.2    CBC Recent Labs  Lab 10/15/17 0344 10/16/17 0225 10/17/17 0346  HGB 10.0* 9.1* 9.2*  HCT 32.1* 29.0* 29.6*  WBC 15.8* 11.3* 7.7  PLT 152 142* 132*    COAGULATION No results for input(s): INR in the last 168 hours.  CARDIAC   Recent Labs  Lab 10/10/17 1841 10/11/17 0423 10/11/17 1155 10/11/17 1642 10/12/17 0014  TROPONINI <0.03 0.04* 0.06* 0.06* 0.07*   No results for input(s): PROBNP in the last 168 hours.   CHEMISTRY Recent Labs  Lab 10/11/17 0423 10/11/17 1852 10/12/17 0233 10/12/17 1642 10/13/17 0455 10/14/17 0050 10/15/17 0344  NA 142  --  140  --   --  136 136  K 3.4*  --  3.2*  --   --  4.1 4.5  CL 104  --  104  --   --  100* 98*  CO2 28  --  27  --   --  27 31  GLUCOSE 148*  --  175*  --   --  131* 118*  BUN 18  --  19  --   --  27* 23*  CREATININE 1.39*  --  1.29*  --   --  1.09* 1.14*  CALCIUM 7.4*  --  7.0*  --   --  6.5* 6.9*  MG 2.0 1.8 1.7 1.4* 1.8 1.5*  --   PHOS <1.0* 2.4* 2.7 3.7 3.6  --   --    Estimated Creatinine Clearance: 35.1 mL/min (A) (by C-G formula based on SCr of 1.14 mg/dL (H)).   LIVER Recent Labs  Lab 10/11/17 0423  AST 26  ALT 9*  ALKPHOS 37*  BILITOT 0.5  PROT 5.1*  ALBUMIN 2.7*     INFECTIOUS Recent Labs  Lab 10/10/17 1638 10/10/17 1841 10/11/17 0423 10/12/17 0233  LATICACIDVEN 3.6* 4.1*  --   --   PROCALCITON <0.10  --  0.24 0.17     ENDOCRINE CBG (last 3)  Recent Labs    10/15/17 0752 10/15/17 1144 10/15/17 1613  GLUCAP 85 103* 102*         IMAGING x48h  - image(s) personally  visualized  -   highlighted in bold Dg Chest  2 View  Result Date: 10/15/2017 CLINICAL DATA:  Respiratory failure. Pt denies chest pain. Hx of COPD and HTN. Former smoker. EXAM: CHEST  2 VIEW COMPARISON:  Chest x-ray dated 10/12/2017, 06/28/2015 and 01/13/2013. FINDINGS: Heart size and mediastinal contours are stable. Atherosclerotic changes noted at the aortic arch. Lungs are hyperexpanded. Stable nodular scarring/fibrosis in the left upper lobe. No evidence of pneumonia or pulmonary edema. No pleural effusion or pneumothorax seen. No acute or suspicious osseous finding IMPRESSION: 1. No active cardiopulmonary disease. No evidence of pneumonia or pulmonary edema. 2. Aortic atherosclerosis. Electronically Signed   By: Franki Cabot M.D.   On: 10/15/2017 16:02   Nm Pulmonary Perf And Vent  Result Date: 10/15/2017 CLINICAL DATA:  Positive D-dimer with difficulty breathing EXAM: NUCLEAR MEDICINE VENTILATION - PERFUSION LUNG SCAN VIEWS: Anterior, posterior, left lateral, right lateral, RPO, LPO, RAO, LAO -ventilation and perfusion RADIOPHARMACEUTICALS:  31.4 mCi Technetium-23m DTPA aerosol inhalation and 4.1 mCi Technetium-77m MAA IV COMPARISON:  Chest radiograph October 15, 2017 ; chest CT October 12, 2017 FINDINGS: Ventilation: There are near complete segmental areas of photopenia involving anterior and posterior segments of each upper lobe. There are scattered subsegmental lower lobe areas of photopenia. Perfusion: On the perfusion study, there are photopenic areas in each upper lobe which match the ventilation defects. There are matching subsegmental defects in each lower lobe. There is no appreciable ventilation/perfusion mismatch. Chest radiograph and chest CT shows evidence suggesting emphysema without widespread bullous disease. IMPRESSION: There are multiple matching ventilation and profusion defects which for extensive in the upper lobes bilaterally and relatively minor elsewhere. There is no appreciable  ventilation/ perfusion mismatch. There is underlying emphysematous change per chest radiograph and chest CT studies. The degree perfusion abnormality places the study in an intermediate category for pulmonary embolus. The lack of appreciable ventilation/perfusion mismatch places this study at the lower end of the spectrum of the intermediate category for pulmonary embolus, at approximately 20% probability based on PIOPED II criteria. Electronically Signed   By: Lowella Grip III M.D.   On: 10/15/2017 16:00     Results for orders placed or performed during the hospital encounter of 10/10/17  MRSA PCR Screening     Status: None   Collection Time: 10/10/17  2:02 PM  Result Value Ref Range Status   MRSA by PCR NEGATIVE NEGATIVE Final    Comment:        The GeneXpert MRSA Assay (FDA approved for NASAL specimens only), is one component of a comprehensive MRSA colonization surveillance program. It is not intended to diagnose MRSA infection nor to guide or monitor treatment for MRSA infections.   Culture, respiratory (NON-Expectorated)     Status: None   Collection Time: 10/10/17  5:53 PM  Result Value Ref Range Status   Specimen Description TRACHEAL ASPIRATE  Final   Special Requests NONE  Final   Gram Stain   Final    ABUNDANT WBC PRESENT,BOTH PMN AND MONONUCLEAR RARE SQUAMOUS EPITHELIAL CELLS PRESENT FEW GRAM NEGATIVE RODS FEW GRAM POSITIVE COCCI IN PAIRS IN CHAINS    Culture   Final    RARE FUNGUS (MOLD) ISOLATED, PROBABLE CONTAMINANT/COLONIZER (SAPROPHYTE). CONTACT MICROBIOLOGY IF FURTHER IDENTIFICATION REQUIRED [phone].   Report Status 10/13/2017 FINAL  Final  Culture, Urine     Status: None   Collection Time: 10/10/17  6:38 PM  Result Value Ref Range Status   Specimen Description URINE, CATHETERIZED  Final   Special Requests NONE  Final   Culture NO GROWTH  Final   Report Status 10/11/2017 FINAL  Final  Culture, blood (routine x 2)     Status: None   Collection Time:  10/10/17  6:41 PM  Result Value Ref Range Status   Specimen Description BLOOD LEFT ANTECUBITAL  Final   Special Requests IN PEDIATRIC BOTTLE Blood Culture adequate volume  Final   Culture NO GROWTH 5 DAYS  Final   Report Status 10/15/2017 FINAL  Final  Culture, blood (routine x 2)     Status: None   Collection Time: 10/10/17  6:41 PM  Result Value Ref Range Status   Specimen Description BLOOD BLOOD LEFT HAND  Final   Special Requests IN PEDIATRIC BOTTLE Blood Culture adequate volume  Final   Culture NO GROWTH 5 DAYS  Final   Report Status 10/15/2017 FINAL  Final      ASSESSMENT and PLAN  Acute on chronic respiratory failure with hypoxia and hypercapnia (HCC) She is improved. In retrospect no hx of AECOPD but presented like that. Improve dwithout steroids or antibiotics  Plan Continue o2 baseline 3LNC Add scheduled BD - xopenex and atrovent (ANORO at home) Hold off steroids but need to talk to her about it - atleast ICS Check RVP  Coma ([employer]) Resolved. Hyprecapnia only issue found as etiology  COPD exacerbation ([place]) Will hold off on abx and steroid Rx given improvement though this might have been the inciting cause  Tachycardia HR 100s  Plan Restart home  flecanidie Continue lopressor (on atenolol) Cards to adjust futher  Physical deconditioning PT consult      FAMILY  - Updates: 10/17/2017 --> patient at bedsided  - Inter-disciplinary family meet or Palliative Care meeting due by:  DAy 7. Current LOS is LOS 7 days  CODE STATUS    Code Status Orders  (From admission, onward)        Start     Ordered   10/10/17 1137  Full code  Continuous     10/10/17 1137    Code Status History    Date Active Date Inactive Code Status Order ID Comments User Context   This patient has a current code status but no historical code status.        DISPO r Transfer to med surg tele . Give to [facility] and ccm consult      Dr. Brand Males, M.D.,  [facility].C.P Pulmonary and Critical Care Medicine Staff Physician [facility] Pager: [phone], If no answer or between  15:00h - 7:00h: call [phone]  10/17/2017 1:52 PM

## 2017-10-18 ENCOUNTER — Inpatient Hospital Stay (HOSPITAL_COMMUNITY): Payer: Medicare Other

## 2017-10-18 DIAGNOSIS — I509 Heart failure, unspecified: Secondary | ICD-10-CM

## 2017-10-18 DIAGNOSIS — I82409 Acute embolism and thrombosis of unspecified deep veins of unspecified lower extremity: Secondary | ICD-10-CM

## 2017-10-18 DIAGNOSIS — J9601 Acute respiratory failure with hypoxia: Secondary | ICD-10-CM

## 2017-10-18 DIAGNOSIS — J9621 Acute and chronic respiratory failure with hypoxia: Secondary | ICD-10-CM

## 2017-10-18 LAB — BLOOD GAS, ARTERIAL
Acid-Base Excess: 13.6 mmol/L — ABNORMAL HIGH (ref 0.0–2.0)
BICARBONATE: 39.6 mmol/L — AB (ref 20.0–28.0)
DRAWN BY: 52076
O2 Content: 3 L/min
O2 Saturation: 96.9 %
PCO2 ART: 72.1 mmHg — AB (ref 32.0–48.0)
PH ART: 7.359 (ref 7.350–7.450)
Patient temperature: 98.6
pO2, Arterial: 91.9 mmHg (ref 83.0–108.0)

## 2017-10-18 LAB — BASIC METABOLIC PANEL
ANION GAP: 10 (ref 5–15)
BUN: 15 mg/dL (ref 6–20)
CO2: 38 mmol/L — ABNORMAL HIGH (ref 22–32)
Calcium: 8.7 mg/dL — ABNORMAL LOW (ref 8.9–10.3)
Chloride: 89 mmol/L — ABNORMAL LOW (ref 101–111)
Creatinine, Ser: 1.25 mg/dL — ABNORMAL HIGH (ref 0.44–1.00)
GFR calc Af Amer: 47 mL/min — ABNORMAL LOW (ref >60)
GFR, EST NON AFRICAN AMERICAN: 40 mL/min — AB (ref >60)
Glucose, Bld: 135 mg/dL — ABNORMAL HIGH (ref 65–99)
POTASSIUM: 3.3 mmol/L — AB (ref 3.5–5.1)
SODIUM: 137 mmol/L (ref 135–145)

## 2017-10-18 LAB — RESPIRATORY PANEL BY PCR
ADENOVIRUS-RVPPCR: NOT DETECTED
Bordetella pertussis: NOT DETECTED
CORONAVIRUS 229E-RVPPCR: NOT DETECTED
CORONAVIRUS HKU1-RVPPCR: NOT DETECTED
CORONAVIRUS NL63-RVPPCR: NOT DETECTED
CORONAVIRUS OC43-RVPPCR: NOT DETECTED
Chlamydophila pneumoniae: NOT DETECTED
Influenza A: NOT DETECTED
Influenza B: NOT DETECTED
METAPNEUMOVIRUS-RVPPCR: NOT DETECTED
Mycoplasma pneumoniae: NOT DETECTED
PARAINFLUENZA VIRUS 1-RVPPCR: NOT DETECTED
PARAINFLUENZA VIRUS 3-RVPPCR: NOT DETECTED
Parainfluenza Virus 2: NOT DETECTED
Parainfluenza Virus 4: NOT DETECTED
RHINOVIRUS / ENTEROVIRUS - RVPPCR: NOT DETECTED
Respiratory Syncytial Virus: NOT DETECTED

## 2017-10-18 LAB — CBC
HEMATOCRIT: 27.7 % — AB (ref 36.0–46.0)
Hemoglobin: 8.5 g/dL — ABNORMAL LOW (ref 12.0–15.0)
MCH: 29 pg (ref 26.0–34.0)
MCHC: 30.7 g/dL (ref 30.0–36.0)
MCV: 94.5 fL (ref 78.0–100.0)
Platelets: 134 10*3/uL — ABNORMAL LOW (ref 150–400)
RBC: 2.93 MIL/uL — ABNORMAL LOW (ref 3.87–5.11)
RDW: 13.6 % (ref 11.5–15.5)
WBC: 7 10*3/uL (ref 4.0–10.5)

## 2017-10-18 LAB — MAGNESIUM: Magnesium: 1.4 mg/dL — ABNORMAL LOW (ref 1.7–2.4)

## 2017-10-18 LAB — IRON AND TIBC
IRON: 30 ug/dL (ref 28–170)
Saturation Ratios: 12 % (ref 10.4–31.8)
TIBC: 252 ug/dL (ref 250–450)
UIBC: 222 ug/dL

## 2017-10-18 LAB — RETICULOCYTES
RBC.: 3.24 MIL/uL — ABNORMAL LOW (ref 3.87–5.11)
RETIC COUNT ABSOLUTE: 113.4 10*3/uL (ref 19.0–186.0)
Retic Ct Pct: 3.5 % — ABNORMAL HIGH (ref 0.4–3.1)

## 2017-10-18 LAB — VITAMIN B12: Vitamin B-12: 1055 pg/mL — ABNORMAL HIGH (ref 180–914)

## 2017-10-18 LAB — PHOSPHORUS: PHOSPHORUS: 3.7 mg/dL (ref 2.5–4.6)

## 2017-10-18 LAB — FERRITIN: Ferritin: 141 ng/mL (ref 11–307)

## 2017-10-18 LAB — FOLATE: FOLATE: 26 ng/mL (ref >5.9)

## 2017-10-18 MED ORDER — APIXABAN 5 MG PO TABS: 5.0000 mg | ORAL_TABLET | Freq: Two times a day (BID) | ORAL | Status: DC

## 2017-10-18 MED ORDER — PREDNISONE 50 MG PO TABS
50.0000 mg | ORAL_TABLET | Freq: Four times a day (QID) | ORAL | Status: AC
  Administered 2017-10-18 – 2017-10-19 (×3): 50 mg via ORAL
  Filled 2017-10-18 (×3): qty 1

## 2017-10-18 MED ORDER — DIPHENHYDRAMINE HCL 25 MG PO CAPS: 50.0000 mg | ORAL_CAPSULE | Freq: Once | ORAL | Status: DC

## 2017-10-18 MED ORDER — UMECLIDINIUM-VILANTEROL 62.5-25 MCG/INH IN AEPB
1.0000 | INHALATION_SPRAY | Freq: Every day | RESPIRATORY_TRACT | Status: DC
  Administered 2017-10-19 – 2017-10-20 (×2): 1 via RESPIRATORY_TRACT
  Filled 2017-10-18 (×2): qty 14

## 2017-10-18 MED ORDER — LEVALBUTEROL HCL 0.63 MG/3ML IN NEBU
0.6300 mg | INHALATION_SOLUTION | Freq: Three times a day (TID) | RESPIRATORY_TRACT | Status: DC
  Filled 2017-10-18 (×2): qty 3

## 2017-10-18 MED ORDER — MAGNESIUM SULFATE 2 GM/50ML IV SOLN
2.0000 g | Freq: Once | INTRAVENOUS | Status: AC
  Administered 2017-10-18: 2 g via INTRAVENOUS
  Filled 2017-10-18: qty 50

## 2017-10-18 MED ORDER — APIXABAN 5 MG PO TABS
10.0000 mg | ORAL_TABLET | Freq: Two times a day (BID) | ORAL | Status: DC
  Administered 2017-10-18 – 2017-10-19 (×3): 10 mg via ORAL
  Filled 2017-10-18 (×3): qty 2

## 2017-10-18 MED ORDER — IPRATROPIUM BROMIDE 0.02 % IN SOLN: 0.5000 mg | Freq: Three times a day (TID) | RESPIRATORY_TRACT | Status: DC

## 2017-10-18 MED ORDER — SODIUM CHLORIDE 0.9 % IV SOLN
INTRAVENOUS | Status: DC
Start: 2017-10-18 — End: 2017-10-20
  Administered 2017-10-18 – 2017-10-19 (×3): via INTRAVENOUS

## 2017-10-18 NOTE — Progress Notes (Signed)
CRITICAL VALUE ALERT  Critical Value:  PCO2 72.1  Date & Time Notied:  10/18/2017 0450  Provider Notified: Dr. Corinna Lines  Orders Received/Actions taken: Monitor patient per Dr. Corinna Lines who states patient has chronic CO2 retention. RN will continue to monitor patient.  Patient does not appear SOB and having no difficulty breathing at this time. P.J. Linus Mako, RN

## 2017-10-18 NOTE — Progress Notes (Signed)
RN received message from Lovey Newcomer, NP that [facility] are not yet caring for patient.  RN called [facility] and spoke with secretary who states Dr. Corinna Lines is caring for patient.  RN transferred to Dr. Corinna Lines and informed him patient is due for Metoprolol 50 now and had Flecainide 50 mg at 2244 and that patient's BP is 119/50 with HR of 137.  RN inquired if patient is ok to receive Metoprolol with current BP.  Dr. Corinna Lines states that oral Metoprolol will not lower BP like IV Metoprolol will and instructed RN to give dose as ordered.  RN will monitor for any issues and report issues to MD as needed.  P.J. Linus Mako, RN

## 2017-10-18 NOTE — Progress Notes (Signed)
RN notified Lovey Newcomer, NP that patient's BP 119/50 and HR 137.  Patient is due for Metoprolol and RN inquired if patient is ok to receive medication as patient received Flecainide 50 mg @ 2111.  RN awaiting response.  P.J. Linus Mako, RN

## 2017-10-18 NOTE — Progress Notes (Addendum)
PROGRESS NOTE    Carol Carson  AGT:[id number] DOB: 08/04/1941 DOA: 10/10/2017 PCP: Leeroy Cha, MD   Brief Narrative: 76 year old female with a past medical history significant for COPD who was intubated in the [facility] emergency department on October 10, 2017 for hypercapnic respiratory failure. She was initially hypothermic but subsequently had a temperature and an elevated lactate. She was extensively volume loaded and empirically was started on Levaquin in addition to systemic steroids and bronchodilators. She was difficult to wean and became overtly apneic on one attempt. All opiates were held and her FiO2 reduced and she was successfully extubated 11/7. She has a chronic sinus tachycardia now being treated with beta blockers. 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 PA pressure was estimated at 35. Nuclear study 10/2016 did not show fixed or reversible ischemia. 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 D dimer this admission was positive, dopplers negative, and V/Q low prob  EVENTS 10/10/2017 - admit 11/5 - CT chest - Previous nodular opacity abutting the pleura in the left upper lobe is now diffusely calcified and felt to represent benign etiology. There are new nodular opacities on the left, largest measuring 7 x 6 mm in the lateral segment left lower lobe. Non-contrast chest CT at 6-12 months is recommended. If the nodule is stable at time of repeat CT, then future CT at 18-24 months (from today's scan) is considered optional for low-risk patients, but is recommended for high-risk patients. This recommendation follows the consensus statement: Guidelines for Management of Incidental Pulmonary Nodules Detected on CT Images: From the Fleischner Society 2017; Radiology 2017; 224:825-003. 11/7 - extubated 11/8 - MRI brain - chroic ischemic changes only 10/16/17 - She is very alert and denying dyspnea againthis morning. She is sitting up in a chair  reading.She is very anxious       Assessment & Plan:   Active Problems:   COPD exacerbation (HCC)   Acute on chronic respiratory failure with hypoxia and hypercapnia (HCC)   Coma (HCC)   Tachycardia   Physical deconditioning  Acute on chronic Respiratory Failure, hypoxia and hypercapnia.  In retrospect no hx of AECOPD but presented like that.  scheduled BD - xopenex and atrovent (ANORO at home) PCO at 72, this is compensated PH 7.3. 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 Will hydrate patient overnight, will premedicate with prednisone.  I will discontinue benadryl. Patient report adverse effect to benadryl. Will stat eliquis until we are able to get CT angio. Repeat renal function today and tomorrow.  -patient with dyspnea, asking for annoro. Resume her medication. CCM will follow on patient today.  - Coma ([place]) Resolved. Hyprecapnia only issue found as etiology  COPD exacerbation ([place]) Will hold off on abx and steroid Rx given improvement though this might have been the inciting cause resume annoro.   Tachycardia/PSVT On   flecanidie Continue lopressor (on atenolol) Cards to adjust futher  Hypomagnesemia;  Replete IV.   Physical deconditioning PT consult  Anemia; check anemia panel.        DVT prophylaxis: eliquis Code Status: full code.  Family Communication: care discussed with patient.  Disposition Plan: home when stab;e.   Consultants:      Procedures:   V Q scan; intermediate probability    Antimicrobials:   Finished levaquin    Subjective: She is complaining of SOB, would like to be back on anoro.  Patient relates that she was told  that she could not have contrast due to renal failure. She denies allergic reaction to her knowledge. .   Objective: Vitals:   10/18/17 0533 10/18/17 0656 10/18/17 0743 10/18/17 0804  BP: (!) 142/58 (!) 120/56 124/60   Pulse: 85 100    Resp: 19       Temp: 98.6 F (37 C)     TempSrc: Oral     SpO2: 98%   98%  Weight:      Height:        Intake/Output Summary (Last 24 hours) at 10/18/2017 1221 Last data filed at 10/18/2017 1003 Gross per 24 hour  Intake 480 ml  Output 202 ml  Net 278 ml   Filed Weights   10/15/17 0429 10/16/17 0500 10/17/17 0351  Weight: 50.2 kg (110 lb 10.7 oz) 55 kg (121 lb 4.1 oz) 53.8 kg (118 lb 9.7 oz)    Examination:  General exam: Appears calm and comfortable  Respiratory system: Clear to auscultation. Ussing accessory muscle to breath, no wheezing, sat 100 3 L.  Cardiovascular system: S1 & S2 heard, RRR. No JVD, murmurs, rubs, gallops or clicks. No pedal edema. Gastrointestinal system: Abdomen is nondistended, soft and nontender. No organomegaly or masses felt. Normal bowel sounds heard. Central nervous system: Alert and oriented. No focal neurological deficits. Extremities: Symmetric 5 x 5 power. Skin: No rashes, lesions or ulcers    Data Reviewed: I have personally reviewed following labs and imaging studies  CBC: Recent Labs  Lab 10/12/17 0233  10/14/17 0050 10/15/17 0344 10/16/17 0225 10/17/17 0346 10/18/17 0443  WBC 13.7*   < > 15.4* 15.8* 11.3* 7.7 7.0  NEUTROABS 12.5*  --   --  14.4*  --   --   --   HGB 10.0*   < > 9.8* 10.0* 9.1* 9.2* 8.5*  HCT 32.1*   < > 31.3* 32.1* 29.0* 29.6* 27.7*  MCV 93.6   < > 92.6 92.8 93.2 94.0 94.5  PLT 145*   < > 133* 152 142* 132* 134*   < > = values in this interval not displayed.   Basic Metabolic Panel: Recent Labs  Lab 10/11/17 1852 10/12/17 0233 10/12/17 1642 10/13/17 0455 10/14/17 0050 10/15/17 0344 10/18/17 0443  NA  --  140  --   --  136 136  --   K  --  3.2*  --   --  4.1 4.5  --   CL  --  104  --   --  100* 98*  --   CO2  --  27  --   --  27 31  --   GLUCOSE  --  175*  --   --  131* 118*  --   BUN  --  19  --   --  27* 23*  --   CREATININE  --  1.29*  --   --  1.09* 1.14*  --   CALCIUM  --  7.0*  --   --  6.5* 6.9*  --    MG 1.8 1.7 1.4* 1.8 1.5*  --  1.4*  PHOS 2.4* 2.7 3.7 3.6  --   --  3.7   GFR: Estimated Creatinine Clearance: 35.1 mL/min (A) (by C-G formula based on SCr of 1.14 mg/dL (H)). Liver Function Tests: No results for input(s): AST, ALT, ALKPHOS, BILITOT, PROT, ALBUMIN in the last 168 hours. No results for input(s): LIPASE, AMYLASE in the last 168 hours. No results for input(s): AMMONIA in the last  168 hours. Coagulation Profile: No results for input(s): INR, PROTIME in the last 168 hours. Cardiac Enzymes: Recent Labs  Lab 10/11/17 1642 10/12/17 0014  TROPONINI 0.06* 0.07*   BNP (last 3 results) Recent Labs    10/24/16 1638  PROBNP 282.0*   HbA1C: No results for input(s): HGBA1C in the last 72 hours. CBG: Recent Labs  Lab 10/14/17 2347 10/15/17 0321 10/15/17 0752 10/15/17 1144 10/15/17 1613  GLUCAP 119* 142* 85 103* 102*   Lipid Profile: No results for input(s): CHOL, HDL, LDLCALC, TRIG, CHOLHDL, LDLDIRECT in the last 72 hours. Thyroid Function Tests: No results for input(s): TSH, T4TOTAL, FREET4, T3FREE, THYROIDAB in the last 72 hours. Anemia Panel: No results for input(s): VITAMINB12, FOLATE, FERRITIN, TIBC, IRON, RETICCTPCT in the last 72 hours. Sepsis Labs: Recent Labs  Lab 10/12/17 0233  PROCALCITON 0.17    Recent Results (from the past 240 hour(s))  MRSA PCR Screening     Status: None   Collection Time: 10/10/17  2:02 PM  Result Value Ref Range Status   MRSA by PCR NEGATIVE NEGATIVE Final    Comment:        The GeneXpert MRSA Assay (FDA approved for NASAL specimens only), is one component of a comprehensive MRSA colonization surveillance program. It is not intended to diagnose MRSA infection nor to guide or monitor treatment for MRSA infections.   Culture, respiratory (NON-Expectorated)     Status: None   Collection Time: 10/10/17  5:53 PM  Result Value Ref Range Status   Specimen Description TRACHEAL ASPIRATE  Final   Special Requests NONE   Final   Gram Stain   Final    ABUNDANT WBC PRESENT,BOTH PMN AND MONONUCLEAR RARE SQUAMOUS EPITHELIAL CELLS PRESENT FEW GRAM NEGATIVE RODS FEW GRAM POSITIVE COCCI IN PAIRS IN CHAINS    Culture   Final    RARE FUNGUS (MOLD) ISOLATED, PROBABLE CONTAMINANT/COLONIZER (SAPROPHYTE). CONTACT MICROBIOLOGY IF FURTHER IDENTIFICATION REQUIRED [phone].   Report Status 10/13/2017 FINAL  Final  Culture, Urine     Status: None   Collection Time: 10/10/17  6:38 PM  Result Value Ref Range Status   Specimen Description URINE, CATHETERIZED  Final   Special Requests NONE  Final   Culture NO GROWTH  Final   Report Status 10/11/2017 FINAL  Final  Culture, blood (routine x 2)     Status: None   Collection Time: 10/10/17  6:41 PM  Result Value Ref Range Status   Specimen Description BLOOD LEFT ANTECUBITAL  Final   Special Requests IN PEDIATRIC BOTTLE Blood Culture adequate volume  Final   Culture NO GROWTH 5 DAYS  Final   Report Status 10/15/2017 FINAL  Final  Culture, blood (routine x 2)     Status: None   Collection Time: 10/10/17  6:41 PM  Result Value Ref Range Status   Specimen Description BLOOD BLOOD LEFT HAND  Final   Special Requests IN PEDIATRIC BOTTLE Blood Culture adequate volume  Final   Culture NO GROWTH 5 DAYS  Final   Report Status 10/15/2017 FINAL  Final  Respiratory Panel by PCR     Status: None   Collection Time: 10/17/17  6:41 PM  Result Value Ref Range Status   Adenovirus NOT DETECTED NOT DETECTED Final   Coronavirus 229E NOT DETECTED NOT DETECTED Final   Coronavirus HKU1 NOT DETECTED NOT DETECTED Final   Coronavirus NL63 NOT DETECTED NOT DETECTED Final   Coronavirus OC43 NOT DETECTED NOT DETECTED Final   Metapneumovirus NOT DETECTED NOT DETECTED  Final   Rhinovirus / Enterovirus NOT DETECTED NOT DETECTED Final   Influenza A NOT DETECTED NOT DETECTED Final   Influenza B NOT DETECTED NOT DETECTED Final   Parainfluenza Virus 1 NOT DETECTED NOT DETECTED Final   Parainfluenza  Virus 2 NOT DETECTED NOT DETECTED Final   Parainfluenza Virus 3 NOT DETECTED NOT DETECTED Final   Parainfluenza Virus 4 NOT DETECTED NOT DETECTED Final   Respiratory Syncytial Virus NOT DETECTED NOT DETECTED Final   Bordetella pertussis NOT DETECTED NOT DETECTED Final   Chlamydophila pneumoniae NOT DETECTED NOT DETECTED Final   Mycoplasma pneumoniae NOT DETECTED NOT DETECTED Final         Radiology Studies: No results found.      Scheduled Meds: . feeding supplement (ENSURE ENLIVE)  237 mL Oral Q24H  . flecainide  50 mg Oral BID  . ipratropium  0.5 mg Nebulization TID  . levalbuterol  0.63 mg Nebulization TID  . metoprolol tartrate  50 mg Oral Q6H   Continuous Infusions:   LOS: 8 days    Time spent: 35 minutes.     Elmarie Shiley, MD [facility] Pager [phone]  If 7PM-7AM, please contact night-coverage [email or web address] Password [email or web address] 10/18/2017, 12:21 PM

## 2017-10-18 NOTE — Progress Notes (Addendum)
Vascular lab called to confirm that they should do vascular ultrasound of lower extremities as this was done on 10/11/17. Dr.Kline text paged.   11:51 Dr.Kline confirmed he wants vascular ultrasound done.

## 2017-10-18 NOTE — Progress Notes (Signed)
*  PRELIMINARY RESULTS* Vascular Ultrasound Bilateral lower extremity venous duplex has been completed.  Preliminary findings: No evidence of deep vein thrombosis or baker's cysts bilaterally.   Everrett Coombe 10/18/2017, 12:22 PM

## 2017-10-18 NOTE — Progress Notes (Signed)
[facility] Attending Note   ADMISSION DATE:  10/10/2017  CONSULTATION DATE:  10/10/2017  REFERRING MD:  Alvino Chapel  CHIEF COMPLAINT:  Found down  Presenting HPI:  76 y.o. female with past medical history significant for COPD. Seen on 11/3 in the emergency department with acute hypercarbic respiratory failure. Found down initially hypothermic and with elevated lactic acid. Patient was volume resuscitated and empirically started on Levaquin with steroids and bronchodilator therapy. After holding opiates patient was able to successfully extubate 11/7. Chronic sinus tachycardia treated with beta blockers. 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 Evidence of cor pulmonale on echocardiogram.  Subjective:  No acute events overnight. Patient feels as though nebulizer therapies are making her cough worse. Denies any audible wheezing. Ambulating without significant dyspnea. Denies any near syncope. No chest pain, pressure, tightness, or palpitations.  Review of Systems:  Denies any subjective fever, chills, or sweats. No abdominal pain or nausea.  Temp:  [98.3 F (36.8 C)-98.7 F (37.1 C)] 98.5 F (36.9 C) (11/11 1832) Pulse Rate:  [85-150] 140 (11/11 1832) Resp:  [16-26] 16 (11/11 1832) BP: (103-142)/(46-73) 118/73 (11/11 1832) SpO2:  [97 %-100 %] 100 % (11/11 1832)  On 3 L/m via nasal cannula  General:  Awake. No distress. Alert. Nurse at bedside. Integument:  Warm & dry. No rash on exposed skin. HEENT:  No scleral icterus or injection. Pupils symmetric.  Pulmonary:  Clear bilaterally to auscultation with good aeration in the bases. No accessory muscle use on nasal cannula oxygen.  Cardiovascular:  Regular rate. No JVD apprecaited.  Abdomen:  Soft. Nontender. Normal bowel sounds. Neurological:  Cranial nerves grossly in tact. No meningismus. Moving all 4 extremities equally.   CBC Latest Ref Rng & Units 10/18/2017 10/17/2017 10/16/2017  WBC 4.0 - 10.5 K/uL 7.0 7.7 11.3(H)   Hemoglobin 12.0 - 15.0 g/dL 8.5(L) 9.2(L) 9.1(L)  Hematocrit 36.0 - 46.0 % 27.7(L) 29.6(L) 29.0(L)  Platelets 150 - 400 K/uL 134(L) 132(L) 142(L)    BMP Latest Ref Rng & Units 10/18/2017 10/15/2017 10/14/2017  Glucose 65 - 99 mg/dL 135(H) 118(H) 131(H)  BUN 6 - 20 mg/dL 15 23(H) 27(H)  Creatinine 0.44 - 1.00 mg/dL 1.25(H) 1.14(H) 1.09(H)  Sodium 135 - 145 mmol/L 137 136 136  Potassium 3.5 - 5.1 mmol/L 3.3(L) 4.5 4.1  Chloride 101 - 111 mmol/L 89(L) 98(L) 100(L)  CO2 22 - 32 mmol/L 38(H) 31 27  Calcium 8.9 - 10.3 mg/dL 8.7(L) 6.9(L) 6.5(L)    IMAGING/STUDIES: PFT 01/17/13:  FVC 1.39 L (48%) FEV1 0.77 L (35%) FEV1/FVC 0.55 FEF 25-75 0.3. There (18%) negative bronchodilator response TLC 4.76 L (95%) RV 145% DLCO uncorrected 32% VENOUS DUPLEX BILAT LE 10/11/17:  Negative for DVT or SVT.  TTE 10/12/17:  LV normal in size with EF 60-65%. Inadequate to assess wall motion. Unable to assess diastolic function. LA & RA normal in size. RV with images suggesting dilation and severe hypokinesis suggestive of RV strain. No aortic stenosis or regurgitation. Aortic root normal in size. No significant pulmonic regurgitation was poorly visualized valve. Trivial tricuspid regurgitation. No pericardial effusion. CT CHEST W/O 11/5:  Personally reviewed by me. Small bilateral pleural effusions left greater than right. Fluid tracking up with a left fissure. Partially calcified nodular opacity abutting the pleura and left upper lobe laterally. Other subcentimeter nodular opacities on the left noted. No pathologic mediastinal adenopathy. No pericardial effusion. MRI BRAIN W/O 11/8:  No acute intracranial abnormality. Sequelae of chronic ischemic microangiopathy. V/Q Scan 11/8:  There  are multiple matching ventilation and profusion defects which for extensive in the upper lobes bilaterally and relatively minor elsewhere. There is no appreciable ventilation/ perfusion mismatch. There is underlying emphysematous change per  chest radiograph and chest CT studies. The degree perfusion abnormality places the study in an intermediate category for pulmonary embolus. The lack of appreciable ventilation/perfusion mismatch places this study at the lower end of the spectrum of the intermediate category for pulmonary embolus, at approximately 20% probability based on PIOPED II criteria.  MICROBIOLOGY: MRSA PCR 11/3:  Negative Blood Cultures x2 11/3:  Negative  Urine Culture 11/3:  Negative  Tracheal Aspirate Culture 11/3:  Negative  Respiratory Viral Panel PCR 11/10:  Negative   ANTIBIOTICS: Cipro 11/3 (x1 dose) Levaquin 11/4 - 11/9  ASSESSMENT/PLAN:  76 y.o. female with severe COPD based on a function testing from 2014. Patient admitted with acute on chronic hypoxic respiratory failure. Echocardiogram suggestive of cor pulmonale which could be chronic or possibly related to pulmonary embolism.  1. Acute on chronic hypoxic respiratory failure: Recommend continuing to wean FiO2 as tolerated for saturation greater than 90%. 2. Severe COPD: No signs of acute exacerbation at this time. Recommend continuing Anoro daily. Discontinuing scheduled Xopenex nebulizer therapies. Continuing to use Xopenex as needed. 3. Cor pulmonale: Plan for CT angiogram of the chest tomorrow. Currently on systemic anticoagulation with Eliquis.  I have spent a total of 36 minutes of time today caring for the patient, reviewing the patient's electronic medical record, and with more than 50% of that time spent coordinating care with the patient as well as reviewing the continuing plan of care with the patient at bedside.  Remainder of care as per primary service and other consultants.   Sonia Baller Ashok Cordia, M.D. [facility] Pager:  [phone] After 7pm or if no response, call [phone] 7:37 PM 10/18/17

## 2017-10-18 NOTE — Plan of Care (Signed)
Patient is having anxiety about CT angiogram tomorrow and requests Alprazolam for relaxation.  Otherwise, patient is breathing well and having no difficulties at this time. RN will continue to monitor for any issues and make MD aware as needed. P.J. Linus Mako, RN

## 2017-10-18 NOTE — Progress Notes (Signed)
   10/18/17 1426  Vitals  Temp 98.7 F (37.1 C)  Temp Source Oral  BP (!) 110/55  BP Location Left Arm  BP Method Manual  Patient Position (if appropriate) Sitting  Pulse Rate (!) 138  Pulse Rate Source Monitor  Resp 16  Oxygen Therapy  SpO2 98 %  O2 Device Nasal Cannula  O2 Flow Rate (L/min) 3 L/min  Pain Assessment  Pain Assessment No/denies pain  give metoprolol per regalado

## 2017-10-18 NOTE — Progress Notes (Signed)
Progress Note  Patient Name: Carol Carson Date of Encounter: 10/18/2017  Primary Cardiologist: Turner/Allred/Camnitz  Subjective   Denies chest pain and is unaware of her tachycardia.  Echo RV failure new from 2014  LV function normal   VQ yday equivocal   Was not receviing flecainide until yday   Inpatient Medications    Scheduled Meds: . feeding supplement (ENSURE ENLIVE)  237 mL Oral Q24H  . flecainide  50 mg Oral BID  . ipratropium  0.5 mg Nebulization Q6H  . levalbuterol  0.63 mg Nebulization Q6H  . metoprolol tartrate  50 mg Oral Q6H   Continuous Infusions:  PRN Meds: ALPRAZolam, levalbuterol, metoprolol tartrate   Vital Signs    Vitals:   10/18/17 0533 10/18/17 0656 10/18/17 0743 10/18/17 0804  BP: (!) 142/58 (!) 120/56 124/60   Pulse: 85 100    Resp: 19     Temp: 98.6 F (37 C)     TempSrc: Oral     SpO2: 98%   98%  Weight:      Height:        Intake/Output Summary (Last 24 hours) at 10/18/2017 0941 Last data filed at 10/18/2017 0710 Gross per 24 hour  Intake 920 ml  Output 1 ml  Net 919 ml   Filed Weights   10/15/17 0429 10/16/17 0500 10/17/17 0351  Weight: 110 lb 10.7 oz (50.2 kg) 121 lb 4.1 oz (55 kg) 118 lb 9.7 oz (53.8 kg)    Telemetry    Recurrent episodes of atrial tachycardia.  Multiple.  ECG    None today.  Physical Exam   Well developed and nourished in no acute distress HENT normal Neck supple with JVP-flat Carotids brisk and full without bruits Clear Rapdi but regular rate and rhythm, no murmurs or gallops Abd-soft with active BS without hepatomegaly No Clubbing cyanosis edema Skin-warm and dry A & Oriented  Grossly normal sensory and motor function  preliminarily  Labs    Chemistry Recent Labs  Lab 10/12/17 0233 10/14/17 0050 10/15/17 0344  NA 140 136 136  K 3.2* 4.1 4.5  CL 104 100* 98*  CO2 27 27 31   GLUCOSE 175* 131* 118*  BUN 19 27* 23*  CREATININE 1.29* 1.09* 1.14*  CALCIUM 7.0* 6.5* 6.9*    GFRNONAA 39* 48* 45*  GFRAA 45* 55* 52*  ANIONGAP 9 9 7      Hematology Recent Labs  Lab 10/16/17 0225 10/17/17 0346 10/18/17 0443  WBC 11.3* 7.7 7.0  RBC 3.11* 3.15* 2.93*  HGB 9.1* 9.2* 8.5*  HCT 29.0* 29.6* 27.7*  MCV 93.2 94.0 94.5  MCH 29.3 29.2 29.0  MCHC 31.4 31.1 30.7  RDW 13.9 13.7 13.6  PLT 142* 132* 134*    Cardiac Enzymes Recent Labs  Lab 10/11/17 1155 10/11/17 1642 10/12/17 0014  TROPONINI 0.06* 0.06* 0.07*   No results for input(s): TROPIPOC in the last 168 hours.   BNPNo results for input(s): BNP, PROBNP in the last 168 hours.   DDimer  No results for input(s): DDIMER in the last 168 hours.   Radiology    No results found.  Cardiac Studies   ECHO 10/12/17- normal EF RV hypokinesis  Patient Profile     76 y.o. female history of tachycardia, admitted admitted with respiratory failure requiring intubation.  She is having recurrent tachycardia.  Telemetry consistent with atrial tachycardia  Assessment & Plan    Atrial tachycardia/PSVT   COPD  RV failure  Dye allergy   recurrent  atrial tach, but only just received flec again yesterday- I have discussed with Dr Bonnita Hollow and he is agreeable to using amiodarone if necessary.  We are also concerned about RV failure which is new in the context of respiratory failure and a indeterminate VQ scan.  We will do venous Dopplers to see if we can avoid CT scanning.  Her concern was that Dr. Justin Mend has voiced concerns of renal risk contrast.  Her creatinine right now is not bad with a GFR just under 50  Right heart failure, we will also get a 5 HIAA to exclude carcinoid  She is averse to heparin; we will start her on NOAC pending final results from the above testing    Virl Axe, MD

## 2017-10-18 NOTE — Progress Notes (Signed)
CCMD reports Junctional tachycardia, cardiology signed off yesterday,  Dr. Tyrell Antonio text paged, requested I page cardiology, cardiology on call gave verbal for EKG and will be coming to see patient.

## 2017-10-18 NOTE — Progress Notes (Signed)
   10/18/17 1832  Vitals  Temp 98.5 F (36.9 C)  Temp Source Oral  BP 118/73  Pulse Rate (!) 140  Resp 16  Oxygen Therapy  SpO2 100 %  O2 Device Nasal Cannula  O2 Flow Rate (L/min) 3 L/min   Provider made aware okay to give metoprolol

## 2017-10-18 NOTE — Progress Notes (Signed)
  ANTICOAGULATION CONSULT NOTE - Initial Consult  Pharmacy Consult for apixaban  Indication: pulmonary embolus (presumed)   Allergies  Allergen Reactions  . Contrast Media [Iodinated Diagnostic Agents] Other (See Comments)    LOW KIDNEY FUNCTION///NEED TO CHECK WITH NEPHROLOGIST BEFORE PROCEDURE.  . Biaxin [Clarithromycin] Hives  . Penicillins Hives    Has patient had a PCN reaction causing immediate rash, facial/tongue/throat swelling, SOB or lightheadedness with hypotension: Yes Has patient had a PCN reaction causing severe rash involving mucus membranes or skin necrosis: No Has patient had a PCN reaction that required hospitalization: No Has patient had a PCN reaction occurring within the last 10 years: No If all of the above answers are "NO", then may proceed with Cephalosporin use.  . Sulfa Antibiotics Rash    Patient Measurements: Height: 5\' 5"  (165.1 cm) Weight: 118 lb 9.7 oz (53.8 kg) IBW/kg (Calculated) : 57   Vital Signs: Temp: 98.6 F (37 C) (11/11 0533) Temp Source: Oral (11/11 0533) BP: 124/60 (11/11 0743) Pulse Rate: 100 (11/11 0656)  Labs: Recent Labs    10/16/17 0225 10/17/17 0346 10/18/17 0443  HGB 9.1* 9.2* 8.5*  HCT 29.0* 29.6* 27.7*  PLT 142* 132* 134*  HEPARINUNFRC <0.10*  --   --     Estimated Creatinine Clearance: 35.1 mL/min (A) (by C-G formula based on SCr of 1.14 mg/dL (H)).   Medical History: Past Medical History:  Diagnosis Date  . Chronic renal insufficiency   . COPD (chronic obstructive pulmonary disease) ([place])   . Diverticulosis   . DJD (degenerative joint disease)   . Endometrial cancer ([place])   . Hyperlipidemia   . Hypertension   . IBS (irritable bowel syndrome)   . RLS (restless legs syndrome)   . SVT (supraventricular tachycardia) ([place])   . Uterine cancer [facility])     Assessment: Patient is being started on apixaban for presumed PE after V/Q on 11/10 was indeterminate and patient averse to resuming heparin drip until a  clot can be ruled out completely. Bilateral LE dopplers preliminary results show no evidence of DVT.   Plan:  Start Apixaban 10mg  PO BID for 7 days, then 5mg  PO BID  Continue to monitor for confirmation of PE/ DVT and continuation of therapy  Monitor for signs and symptoms of bleeding   Jalene Mullet, Pharm.D. PGY1 Pharmacy Resident 10/18/2017 1:58 PM Main Pharmacy: [phone]

## 2017-10-19 ENCOUNTER — Ambulatory Visit: Payer: Medicare Other | Admitting: Internal Medicine

## 2017-10-19 ENCOUNTER — Encounter (HOSPITAL_COMMUNITY): Payer: Self-pay | Admitting: Radiology

## 2017-10-19 ENCOUNTER — Inpatient Hospital Stay (HOSPITAL_COMMUNITY): Payer: Medicare Other

## 2017-10-19 LAB — BASIC METABOLIC PANEL
Anion gap: 9 (ref 5–15)
BUN: 14 mg/dL (ref 6–20)
CHLORIDE: 90 mmol/L — AB (ref 101–111)
CO2: 37 mmol/L — ABNORMAL HIGH (ref 22–32)
Calcium: 8 mg/dL — ABNORMAL LOW (ref 8.9–10.3)
Creatinine, Ser: 1 mg/dL (ref 0.44–1.00)
GFR calc Af Amer: >60 mL/min (ref >60)
GFR calc non Af Amer: 53 mL/min — ABNORMAL LOW (ref >60)
Glucose, Bld: 142 mg/dL — ABNORMAL HIGH (ref 65–99)
POTASSIUM: 3.9 mmol/L (ref 3.5–5.1)
SODIUM: 136 mmol/L (ref 135–145)

## 2017-10-19 LAB — CALCIUM, IONIZED: Calcium, Ionized, Serum: 4.6 mg/dL (ref 4.5–5.6)

## 2017-10-19 LAB — PHOSPHORUS: Phosphorus: 3.7 mg/dL (ref 2.5–4.6)

## 2017-10-19 LAB — CBC
HEMATOCRIT: 30.4 % — AB (ref 36.0–46.0)
Hemoglobin: 9.2 g/dL — ABNORMAL LOW (ref 12.0–15.0)
MCH: 28.9 pg (ref 26.0–34.0)
MCHC: 30.3 g/dL (ref 30.0–36.0)
MCV: 95.6 fL (ref 78.0–100.0)
Platelets: 173 10*3/uL (ref 150–400)
RBC: 3.18 MIL/uL — ABNORMAL LOW (ref 3.87–5.11)
RDW: 13.4 % (ref 11.5–15.5)
WBC: 6.8 10*3/uL (ref 4.0–10.5)

## 2017-10-19 LAB — MAGNESIUM: Magnesium: 1.7 mg/dL (ref 1.7–2.4)

## 2017-10-19 MED ORDER — LOPERAMIDE HCL 2 MG PO CAPS
4.0000 mg | ORAL_CAPSULE | Freq: Once | ORAL | Status: AC
  Administered 2017-10-19: 4 mg via ORAL
  Filled 2017-10-19: qty 2

## 2017-10-19 MED ORDER — IOPAMIDOL (ISOVUE-370) INJECTION 76%
INTRAVENOUS | Status: AC
  Administered 2017-10-19: 65 mL
  Filled 2017-10-19: qty 100

## 2017-10-19 MED ORDER — MAGNESIUM SULFATE 2 GM/50ML IV SOLN
2.0000 g | Freq: Once | INTRAVENOUS | Status: AC
  Administered 2017-10-19: 2 g via INTRAVENOUS
  Filled 2017-10-19: qty 50

## 2017-10-19 MED ORDER — POTASSIUM CHLORIDE CRYS ER 20 MEQ PO TBCR
40.0000 meq | EXTENDED_RELEASE_TABLET | Freq: Once | ORAL | Status: AC
  Administered 2017-10-19: 40 meq via ORAL
  Filled 2017-10-19: qty 2

## 2017-10-19 NOTE — Progress Notes (Signed)
[facility] Attending Note   ADMISSION DATE:  10/10/2017  CONSULTATION DATE:  10/10/2017  REFERRING MD:  Alvino Chapel  CHIEF COMPLAINT:  Found down  Presenting HPI:  76 y.o. female with past medical history significant for COPD. Seen on 11/3 in the emergency department with acute hypercarbic respiratory failure. Found down initially hypothermic and with elevated lactic acid. Patient was volume resuscitated and empirically started on Levaquin with steroids and bronchodilator therapy. After holding opiates patient was able to successfully extubate 11/7. Chronic sinus tachycardia treated with beta blockers. 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 Evidence of cor pulmonale on echocardiogram.  Subjective:  No events overnight, no new complaints, feels she continues to need her BD  Review of Systems:  Denies any subjective fever, chills, or sweats. No abdominal pain or nausea.  Temp:  [97.7 F (36.5 C)-98.7 F (37.1 C)] 97.9 F (36.6 C) (11/12 0554) Pulse Rate:  [123-140] 124 (11/12 0554) Resp:  [16-20] 20 (11/12 0554) BP: (103-144)/(46-75) 144/75 (11/12 0554) SpO2:  [93 %-100 %] 93 % (11/12 0840) Weight:  [54.1 kg (119 lb 5 oz)] 54.1 kg (119 lb 5 oz) (11/12 0500)  On 3 L/m via nasal cannula  General:  Well appearing, NAD Integument:  Warm & dry. No rash on exposed skin. HEENT:  [place]/AT, PERRL, EOM-I and MMM Pulmonary:  Clear bilaterally to auscultation with good aeration in the bases. No accessory muscle use on nasal cannula oxygen.  Cardiovascular:  RRR, Nl S1/S2, -M/R/G. Abdomen:  Soft, NT, ND and +BS Neurological:  Cranial nerves grossly in tact. No meningismus. Moving all 4 extremities equally.   CBC Latest Ref Rng & Units 10/19/2017 10/18/2017 10/17/2017  WBC 4.0 - 10.5 K/uL 6.8 7.0 7.7  Hemoglobin 12.0 - 15.0 g/dL 9.2(L) 8.5(L) 9.2(L)  Hematocrit 36.0 - 46.0 % 30.4(L) 27.7(L) 29.6(L)  Platelets 150 - 400 K/uL 173 134(L) 132(L)   BMP Latest Ref Rng & Units  10/19/2017 10/18/2017 10/15/2017  Glucose 65 - 99 mg/dL 142(H) 135(H) 118(H)  BUN 6 - 20 mg/dL 14 15 23(H)  Creatinine 0.44 - 1.00 mg/dL 1.00 1.25(H) 1.14(H)  Sodium 135 - 145 mmol/L 136 137 136  Potassium 3.5 - 5.1 mmol/L 3.9 3.3(L) 4.5  Chloride 101 - 111 mmol/L 90(L) 89(L) 98(L)  CO2 22 - 32 mmol/L 37(H) 38(H) 31  Calcium 8.9 - 10.3 mg/dL 8.0(L) 8.7(L) 6.9(L)    IMAGING/STUDIES: PFT 01/17/13:  FVC 1.39 L (48%) FEV1 0.77 L (35%) FEV1/FVC 0.55 FEF 25-75 0.3. There (18%) negative bronchodilator response TLC 4.76 L (95%) RV 145% DLCO uncorrected 32% VENOUS DUPLEX BILAT LE 10/11/17:  Negative for DVT or SVT.  TTE 10/12/17:  LV normal in size with EF 60-65%. Inadequate to assess wall motion. Unable to assess diastolic function. LA & RA normal in size. RV with images suggesting dilation and severe hypokinesis suggestive of RV strain. No aortic stenosis or regurgitation. Aortic root normal in size. No significant pulmonic regurgitation was poorly visualized valve. Trivial tricuspid regurgitation. No pericardial effusion. CT CHEST W/O 11/5:  Personally reviewed by me. Small bilateral pleural effusions left greater than right. Fluid tracking up with a left fissure. Partially calcified nodular opacity abutting the pleura and left upper lobe laterally. Other subcentimeter nodular opacities on the left noted. No pathologic mediastinal adenopathy. No pericardial effusion. MRI BRAIN W/O 11/8:  No acute intracranial abnormality. Sequelae of chronic ischemic microangiopathy. V/Q Scan 11/8:  There are multiple matching ventilation and profusion defects which for extensive in the upper lobes bilaterally  and relatively minor elsewhere. There is no appreciable ventilation/ perfusion mismatch. There is underlying emphysematous change per chest radiograph and chest CT studies. The degree perfusion abnormality places the study in an intermediate category for pulmonary embolus. The lack of appreciable ventilation/perfusion  mismatch places this study at the lower end of the spectrum of the intermediate category for pulmonary embolus, at approximately 20% probability based on PIOPED II criteria.  MICROBIOLOGY: MRSA PCR 11/3:  Negative Blood Cultures x2 11/3:  Negative  Urine Culture 11/3:  Negative  Tracheal Aspirate Culture 11/3:  Negative  Respiratory Viral Panel PCR 11/10:  Negative   ANTIBIOTICS: Cipro 11/3 (x1 dose) Levaquin 11/4 - 11/9  I reviewed CXR myself, no acute disease noted.  ASSESSMENT/PLAN:  76 y.o. female with severe COPD based on a function testing from 2014. Patient admitted with acute on chronic hypoxic respiratory failure. Echocardiogram suggestive of cor pulmonale which could be chronic or possibly related to pulmonary embolism.  1. Acute on chronic hypoxic respiratory failure:  1. Titrate O2 for sat of 90% or higher given pulmonary HTN 2. Will need arrangement of home Ow  2. Severe COPD: No signs of acute exacerbation at this time.  1. Continuing Anoro daily.  2. Discontinuing scheduled Xopenex and change to PRN  3. Cor pulmonale:  1. CT today 2. Continue chronic anti-coagulation  Discussed with PCCM-NP  Continue treatment as above, continue anti-coagulation regardless of CT results.  Arrange for F/U with pulmonary as outpatient when closer to discharge.  PCCM will sign off, please call back if needed.  Rush Farmer, M.D. [facility] Pulmonary/Critical Care Medicine. Pager: [phone]. After hours pager: [phone].  9:38 AM 10/19/17

## 2017-10-19 NOTE — Progress Notes (Signed)
Progress Note  Patient Name: Carol Carson Date of Encounter: 10/19/2017  Primary Cardiologist: Turner/Allred/Camnitz  Subjective   Patient endorses feeling back to baseline. She denies palpitations, shortness of breath, chest pain, dizziness. She feels that she is getting her strength back and doing well with ambulation.   Inpatient Medications    Scheduled Meds: . apixaban  10 mg Oral BID   Followed by  . [START ON 10/25/2017] apixaban  5 mg Oral BID  . feeding supplement (ENSURE ENLIVE)  237 mL Oral Q24H  . flecainide  50 mg Oral BID  . metoprolol tartrate  50 mg Oral Q6H  . umeclidinium-vilanterol  1 puff Inhalation Daily   Continuous Infusions: . sodium chloride 75 mL/hr at 10/18/17 2228   PRN Meds: ALPRAZolam, levalbuterol, metoprolol tartrate   Vital Signs    Vitals:   10/19/17 0055 10/19/17 0500 10/19/17 0554 10/19/17 0840  BP: 130/69  (!) 144/75   Pulse: (!) 123  (!) 124   Resp:   20   Temp:   97.9 F (36.6 C)   TempSrc:   Oral   SpO2: 100%  100% 93%  Weight:  119 lb 5 oz (54.1 kg)    Height:        Intake/Output Summary (Last 24 hours) at 10/19/2017 0916 Last data filed at 10/19/2017 6734 Gross per 24 hour  Intake 1178.75 ml  Output 201 ml  Net 977.75 ml   Filed Weights   10/16/17 0500 10/17/17 0351 10/19/17 0500  Weight: 121 lb 4.1 oz (55 kg) 118 lb 9.7 oz (53.8 kg) 119 lb 5 oz (54.1 kg)    Telemetry    Atrial tachycardia - Personally Reviewed  ECG    N/a today  Physical Exam   GEN: No acute distress.   Neck: No JVD Cardiac: tachycardia, regular rhythm, no murmurs, rubs, or gallops.  Respiratory: left sided rales, otherwise equal bil breath sounds, no wheezing or inc work of breathing GI: Soft, nontender, non-distended  MS: No edema; No deformity. Neuro:  Nonfocal  Psych: Normal affect   Labs    Chemistry Recent Labs  Lab 10/15/17 0344 10/18/17 1530 10/19/17 0718  NA 136 137 136  K 4.5 3.3* 3.9  CL 98* 89* 90*  CO2  31 38* 37*  GLUCOSE 118* 135* 142*  BUN 23* 15 14  CREATININE 1.14* 1.25* 1.00  CALCIUM 6.9* 8.7* 8.0*  GFRNONAA 45* 40* 53*  GFRAA 52* 47* >60  ANIONGAP 7 10 9      Hematology Recent Labs  Lab 10/17/17 0346 10/18/17 0443 10/18/17 1530 10/19/17 0518  WBC 7.7 7.0  --  6.8  RBC 3.15* 2.93* 3.24* 3.18*  HGB 9.2* 8.5*  --  9.2*  HCT 29.6* 27.7*  --  30.4*  MCV 94.0 94.5  --  95.6  MCH 29.2 29.0  --  28.9  MCHC 31.1 30.7  --  30.3  RDW 13.7 13.6  --  13.4  PLT 132* 134*  --  173    Cardiac EnzymesNo results for input(s): TROPONINI in the last 168 hours. No results for input(s): TROPIPOC in the last 168 hours.   BNPNo results for input(s): BNP, PROBNP in the last 168 hours.   DDimer No results for input(s): DDIMER in the last 168 hours.   Radiology    No results found.  Cardiac Studies   ECHO 10/12/17- normal EF RV hypokinesis  Patient Profile     76 y.o. female with h/o tachycardia, COPD, admitted  with respiratory failure requiring intubation. Workup has only been positive for hypercapnea. She had intermediate VQ scan and Echo showed new RV failure. Patient is going to get CTA chest for further workup of cor pulmonale. She also continues to have atrial tachycardia.  Assessment & Plan    PSVT Continues with episodes of NSR in 80-90s. Her home flecainide was restarted (initially held as she was on levaquin for concern for QT prolongation) with no overall improvement in her HRs. She is also receiving metoprolol here. --continue flecainide 50mg  BID --continue metoprolol 50mg  q6hrs --considering starting amiodarone as her HR have not been responsive to above therapies  Cor pulmonale Started on therapeutic Eliquis. Going for chest CTA today.  Acute on chronic resp failure s/p intubation and extubation Doing well. Now on 3L [place] which is home regimen for her COPD.  For questions or updates, please contact [place] Please consult [email or web address] for contact info under  Cardiology/STEMI.      Signed, Alphonzo Grieve, MD  10/19/2017, 9:16 AM

## 2017-10-19 NOTE — Progress Notes (Signed)
PT Cancellation Note  Patient Details Name: Carol Carson MRN: [id number] DOB: [birth year]   Cancelled Treatment:    Reason Eval/Treat Not Completed: Patient at procedure or test/unavailable. On arrival to pt's room, transport present to take pt to CT. Will check back if time allows.  Benjiman Core, PTA Pager [phone] Acute Rehab   Allena Katz 10/19/2017, 11:45 AM

## 2017-10-19 NOTE — Progress Notes (Addendum)
Physical Therapy Treatment Patient Details Name: Carol Carson MRN: [id number] DOB: 13-Nov-1941 Today's Date: 10/19/2017    History of Present Illness 76 year old with O2 dependent COPD who was found unresponsive with respiratory failure and intubated 11/3-11/7. PMhx: renal insufficiency. O2 dependent CoPD, HTN, CA, restless leg syndrome    PT Comments    Pt is progressing towards her goals today, however she continues to be limited by oxygen desaturation with ambulation. Pt currently supervision for bed mobility and transfers and is min guard for ambulation of 200 feet with RW. Pt requires skilled PT in the acute setting to progress mobility and improve strength and endurance to safely navigate their discharge environment.    Follow Up Recommendations  Home health PT;Supervision/Assistance - 24 hour     Equipment Recommendations  3in1 (PT);Rolling walker with 5" wheels    Recommendations for Other Services       Precautions / Restrictions Precautions Precautions: Fall Precaution Comments: watch SpO2 Restrictions Weight Bearing Restrictions: No    Mobility  Bed Mobility Overal bed mobility: Needs Assistance Bed Mobility: Rolling;Supine to Sit;Sit to Supine     Supine to sit: Supervision;HOB elevated     General bed mobility comments: able to bring herself to EoB with assist of bedrail  Transfers Overall transfer level: Needs assistance Equipment used: Rolling walker (2 wheeled) Transfers: Sit to/from Stand Sit to Stand: Supervision         General transfer comment: able to push off from bed surface come to standing and steady herself before reaching for RW  Ambulation/Gait Ambulation/Gait assistance: Min guard Ambulation Distance (Feet): 200 Feet Assistive device: Rolling walker (2 wheeled) Gait Pattern/deviations: Step-through pattern;Decreased stride length;Trunk flexed Gait velocity: slowed Gait velocity interpretation: Below normal speed for  age/gender General Gait Details: min guard for safety, vc for staying within walker, and for upright posture      Balance Overall balance assessment: Needs assistance Sitting-balance support: Feet supported;No upper extremity supported Sitting balance-Leahy Scale: Fair     Standing balance support: Bilateral upper extremity supported Standing balance-Leahy Scale: Fair Standing balance comment: able to steady without assist                            Cognition Arousal/Alertness: Awake/alert Behavior During Therapy: WFL for tasks assessed/performed                                   General Comments: Pt reports she is back at her baseline level and is feeling better over all      Exercises      General Comments General comments (skin integrity, edema, etc.): Pt on 3 L O2 via nasal cannula, SaO2 98%O2, with ambulation on 3L SaO2 dropped to 85%O2, O2 increased to 4L and SaO2 only rose to 88%O2 while standing in hallway, O2 increased to 6L and SaO2 increased to 95%O2, in sitting back in her room pt able to maintain 94%O2 on 3L O2Pt with max HR in ambulation of 145 bpm      Pertinent Vitals/Pain Pain Assessment: No/denies pain           PT Goals (current goals can now be found in the care plan section) Acute Rehab PT Goals Patient Stated Goal: to return home PT Goal Formulation: With patient Time For Goal Achievement: 10/29/17 Potential to Achieve Goals: Good Progress towards PT goals: Progressing toward goals  Frequency    Min 3X/week      PT Plan Current plan remains appropriate       AM-PAC PT "6 Clicks" Daily Activity  Outcome Measure  Difficulty turning over in bed (including adjusting bedclothes, sheets and blankets)?: None Difficulty moving from lying on back to sitting on the side of the bed? : None Difficulty sitting down on and standing up from a chair with arms (e.g., wheelchair, bedside commode, etc,.)?: None Help needed  moving to and from a bed to chair (including a wheelchair)?: A Little Help needed walking in hospital room?: A Little Help needed climbing 3-5 steps with a railing? : A Lot 6 Click Score: 20    End of Session Equipment Utilized During Treatment: Gait belt Activity Tolerance: Patient tolerated treatment well Patient left: with call bell/phone within reach;in chair Nurse Communication: Mobility status PT Visit Diagnosis: Other abnormalities of gait and mobility (R26.89);Muscle weakness (generalized) (M62.81)     Time: 7494-4967 PT Time Calculation (min) (ACUTE ONLY): 22 min  Charges:  $Gait Training: 8-22 mins                    G Codes:       Yetta Marceaux B. Migdalia Dk PT, DPT Acute Rehabilitation  [phone] Pager [phone]     [place] 10/19/2017, 5:20 PM

## 2017-10-19 NOTE — Progress Notes (Signed)
Occupational Therapy Treatment Patient Details Name: Carol Carson MRN: [id number] DOB: 01-23-1941 Today's Date: 10/19/2017    History of present illness 76 year old with O2 dependent COPD who was found unresponsive with respiratory failure and intubated 11/3-11/7. PMhx: renal insufficiency. O2 dependent CoPD, HTN, CA, restless leg syndrome   OT comments  Pt demonstrates improving activity tolerance and ability to perform ADLs.  She requires supervision for ADLs.  She appears to have intermittent memory deficits.  I had spoken with her at end of PT session, to inform her I would see her this pm, however, she had no recollection, then she walked to end of hallway and had no idea how to return to her room.  Daughter in law will be providing 24 hour supervision (per pt) initially.  Recommend pt does not drive until cognition further assessed by [facility] therapists.     Follow Up Recommendations  Home health OT;Supervision/Assistance - 24 hour    Equipment Recommendations  3 in 1 bedside commode    Recommendations for Other Services      Precautions / Restrictions Precautions Precautions: Fall Precaution Comments: watch SpO2       Mobility Bed Mobility               General bed mobility comments: sitting EOB   Transfers Overall transfer level: Needs assistance Equipment used: Rolling walker (2 wheeled)   Sit to Stand: Supervision Stand pivot transfers: Supervision            Balance Overall balance assessment: Needs assistance Sitting-balance support: Feet supported;No upper extremity supported Sitting balance-Leahy Scale: Good     Standing balance support: No upper extremity supported Standing balance-Leahy Scale: Fair                             ADL either performed or assessed with clinical judgement   ADL Overall ADL's : Needs assistance/impaired                                       General ADL Comments: Pt able to perform ADLs  with supervision.  She indpendently initiates rest breaks and self paces      Vision       Perception     Praxis      Cognition Arousal/Alertness: Awake/alert Behavior During Therapy: WFL for tasks assessed/performed Overall Cognitive Status: No family/caregiver present to determine baseline cognitive functioning                                 General Comments: Pt able to recall details from her day, however, this therapist spoke directly with her right after PT, and instructed her I would return this pm.  Pt with no recollection of this conversation.  When ambulating in hallway, pt walked down long straight hallway, got to the end, then asked if she needed to go Rt or Lt down the next hallway to return to her room         Exercises     Shoulder Instructions       General Comments DOE 3/4 with activity and HR to 140 with pt on 3L supplemental 02.       Pertinent Vitals/ Pain       Pain Assessment: No/denies pain  Home Living  Prior Functioning/Environment              Frequency  Min 2X/week        Progress Toward Goals  OT Goals(current goals can now be found in the care plan section)  Progress towards OT goals: Progressing toward goals     Plan Discharge plan remains appropriate    Co-evaluation                 AM-PAC PT "6 Clicks" Daily Activity     Outcome Measure   Help from another person eating meals?: None Help from another person taking care of personal grooming?: A Little Help from another person toileting, which includes using toliet, bedpan, or urinal?: A Little Help from another person bathing (including washing, rinsing, drying)?: A Little Help from another person to put on and taking off regular upper body clothing?: A Little Help from another person to put on and taking off regular lower body clothing?: A Little 6 Click Score: 19    End of Session  Equipment Utilized During Treatment: Rolling walker;Oxygen  OT Visit Diagnosis: Unsteadiness on feet (R26.81);Other abnormalities of gait and mobility (R26.89)   Activity Tolerance Patient limited by fatigue   Patient Left in bed;with call bell/phone within reach   Nurse Communication Mobility status        Time: 3202-3343 OT Time Calculation (min): 16 min  Charges: OT Treatments $Self Care/Home Management : 8-22 mins  [facility], OTR/L [phone]    Lucille Passy M 10/19/2017, 6:12 PM

## 2017-10-19 NOTE — Progress Notes (Addendum)
Progress Note  Patient Name: Carol Carson Date of Encounter: 10/19/2017  Primary Cardiologist: Turner/Camnitz  Subjective   No chest pain and no SOB, tells me it will take a few weeks to reduce heart rate.   Inpatient Medications    Scheduled Meds: . apixaban  10 mg Oral BID   Followed by  . [START ON 10/25/2017] apixaban  5 mg Oral BID  . feeding supplement (ENSURE ENLIVE)  237 mL Oral Q24H  . flecainide  50 mg Oral BID  . metoprolol tartrate  50 mg Oral Q6H  . umeclidinium-vilanterol  1 puff Inhalation Daily   Continuous Infusions: . sodium chloride 75 mL/hr at 10/18/17 2228   PRN Meds: ALPRAZolam, levalbuterol, metoprolol tartrate   Vital Signs    Vitals:   10/19/17 0055 10/19/17 0500 10/19/17 0554 10/19/17 0840  BP: 130/69  (!) 144/75   Pulse: (!) 123  (!) 124   Resp:   20   Temp:   97.9 F (36.6 C)   TempSrc:   Oral   SpO2: 100%  100% 93%  Weight:  119 lb 5 oz (54.1 kg)    Height:        Intake/Output Summary (Last 24 hours) at 10/19/2017 0933 Last data filed at 10/19/2017 4431 Gross per 24 hour  Intake 1178.75 ml  Output 201 ml  Net 977.75 ml   Filed Weights   10/16/17 0500 10/17/17 0351 10/19/17 0500  Weight: 121 lb 4.1 oz (55 kg) 118 lb 9.7 oz (53.8 kg) 119 lb 5 oz (54.1 kg)    Telemetry    ST at 126 - Personally Reviewed  ECG    No new - Personally Reviewed  Physical Exam   GEN: No acute distress.   Neck: No JVD Cardiac: RRR, no murmurs, rubs, or gallops.  Respiratory: Clear to auscultation bilaterally. GI: Soft, nontender, non-distended  MS: No edema; No deformity. Neuro:  Nonfocal  Psych: Normal affect   Labs    Chemistry Recent Labs  Lab 10/15/17 0344 10/18/17 1530 10/19/17 0718  NA 136 137 136  K 4.5 3.3* 3.9  CL 98* 89* 90*  CO2 31 38* 37*  GLUCOSE 118* 135* 142*  BUN 23* 15 14  CREATININE 1.14* 1.25* 1.00  CALCIUM 6.9* 8.7* 8.0*  GFRNONAA 45* 40* 53*  GFRAA 52* 47* >60  ANIONGAP 7 10 9       Hematology Recent Labs  Lab 10/17/17 0346 10/18/17 0443 10/18/17 1530 10/19/17 0518  WBC 7.7 7.0  --  6.8  RBC 3.15* 2.93* 3.24* 3.18*  HGB 9.2* 8.5*  --  9.2*  HCT 29.6* 27.7*  --  30.4*  MCV 94.0 94.5  --  95.6  MCH 29.2 29.0  --  28.9  MCHC 31.1 30.7  --  30.3  RDW 13.7 13.6  --  13.4  PLT 132* 134*  --  173    Cardiac EnzymesNo results for input(s): TROPONINI in the last 168 hours. No results for input(s): TROPIPOC in the last 168 hours.   BNPNo results for input(s): BNP, PROBNP in the last 168 hours.   DDimer No results for input(s): DDIMER in the last 168 hours.   Radiology    No results found.  Cardiac Studies   Venous dopplers bil 10/18/17 No evidence of deep vein thrombosis or baker's cysts bilaterally.  VQ 10/15/17  IMPRESSION: There are multiple matching ventilation and profusion defects which for extensive in the upper lobes bilaterally and relatively minor elsewhere. There is  no appreciable ventilation/ perfusion mismatch. There is underlying emphysematous change per chest radiograph and chest CT studies. The degree perfusion abnormality places the study in an intermediate category for pulmonary embolus. The lack of appreciable ventilation/perfusion mismatch places this study at the lower end of the spectrum of the intermediate category for pulmonary embolus, at approximately 20% probability based on PIOPED II criteria.  Patient Profile     76 y.o. female history of tachycardia, though sinus tachycardia as well as SVT on ventilator after being unresponsive at home. She has since been extubated successfully though still having episodes of ST.  Now back on flecainide     Assessment & Plan   atrial tachycardia/PSVT  Flecainide resumed 10/17/17  Also on lopressor 50 mg BID.  On Eliquis   Per Dr. Abelino Derrick "I have discussed with Dr Bonnita Hollow and he is agreeable to using amiodarone if necessary"  Abnormal VQ with neg venous dopplers are legs. ? Need  for CTA of chaest   Right heart failure, we will also get a 5 HIAA to exclude carcinoid    For questions or updates, please contact [place] Please consult [email or web address] for contact info under Cardiology/STEMI.      Signed, Cecilie Kicks, NP  10/19/2017, 9:33 AM    Patient examined chart reviewed see not from resident. Exam with marked pulmonary fibrosis and velcro crackles She wants to go home and tachycardia is asymptomatic. Amiodarone poor choice for lung disease and flecainide typically Takes a few weeks to work for her [place] to d/c home on current meds Has outpatient f/u with Dr Rayann Heman who can consider amiodarone Vs ablation after being on flecainide for a few weeks Will sign off  [employer]

## 2017-10-19 NOTE — Progress Notes (Addendum)
PROGRESS NOTE    Carol Carson  YQM:[id number] DOB: 1941-01-16 DOA: 10/10/2017 PCP: Leeroy Cha, MD   Brief Narrative: 76 year old female with a past medical history significant for COPD who was intubated in the [facility] emergency department on October 10, 2017 for hypercapnic respiratory failure. She was initially hypothermic but subsequently had a temperature and an elevated lactate. She was extensively volume loaded and empirically was started on Levaquin in addition to systemic steroids and bronchodilators. She was difficult to wean and became overtly apneic on one attempt. All opiates were held and her FiO2 reduced and she was successfully extubated 11/7. She has a chronic sinus tachycardia now being treated with beta blockers. 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 PA pressure was estimated at 35. Nuclear study 10/2016 did not show fixed or reversible ischemia. 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 D dimer this admission was positive, dopplers negative, and V/Q low prob  EVENTS 10/10/2017 - admit 11/5 - CT chest - Previous nodular opacity abutting the pleura in the left upper lobe is now diffusely calcified and felt to represent benign etiology. There are new nodular opacities on the left, largest measuring 7 x 6 mm in the lateral segment left lower lobe. Non-contrast chest CT at 6-12 months is recommended. If the nodule is stable at time of repeat CT, then future CT at 18-24 months (from today's scan) is considered optional for low-risk patients, but is recommended for high-risk patients. This recommendation follows the consensus statement: Guidelines for Management of Incidental Pulmonary Nodules Detected on CT Images: From the Fleischner Society 2017; Radiology 2017; 528:413-244. 11/7 - extubated 11/8 - MRI brain - chroic ischemic changes only 10/16/17 - She is very alert and denying dyspnea againthis morning. She is sitting up in a chair  reading.She is very anxious       Assessment & Plan:   Active Problems:   COPD exacerbation (HCC)   Acute on chronic respiratory failure with hypoxia and hypercapnia (HCC)   Coma (HCC)   Tachycardia   Physical deconditioning   Acute on chronic respiratory failure with hypoxia (HCC)  Acute on chronic Respiratory Failure, hypoxia and hypercapnia.  In retrospect no hx of AECOPD but presented like that.  scheduled BD - xopenex and atrovent (ANORO at home) PCO at 72, this is compensated PH 7.3. 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 Will hydrate patient overnight, will premedicate with prednisone.  I will discontinue benadryl. Patient report adverse effect to benadryl.  Continue with Eliquis until CT results available.  Started on Anoro, I have asked pharmacy to get med for patient.  Plan to repeat B-met tomorrow to follow renal function post contrast.  Discussed with Dr Johnsie Cancel, patient does not need anticoagulation from cardiac perspective. He agree with ruling out PE wit CT angio.  Patient agree with procedure. She understand there is risk.   Coma ([place]) Resolved. Hyprecapnia only issue found as etiology.  COPD exacerbation ([place]) Will hold off on abx and steroid Rx given improvement though this might have been the inciting cause resume annoro.   Tachycardia/PSVT On   flecanidie Continue lopressor  Cards to adjust futher  Hypomagnesemia;  Replete IV.   Physical deconditioning PT consult  Anemia; hb stable.  Will try iron trial/   Hypokalemia; replete orally      DVT prophylaxis: eliquis Code Status: full code.  Family Communication: care discussed with patient.  Disposition Plan: home when  stab;e.   Consultants:      Procedures:   V Q scan; intermediate probability    Antimicrobials:   Finished levaquin    Subjective: She can feel that she needs her anoro.  She agree to proceed with CT  angio.   Objective: Vitals:   10/18/17 2218 10/19/17 0055 10/19/17 0500 10/19/17 0554  BP: (!) 143/66 130/69  (!) 144/75  Pulse: (!) 131 (!) 123  (!) 124  Resp:    20  Temp: 97.7 F (36.5 C)   97.9 F (36.6 C)  TempSrc: Oral   Oral  SpO2: 96% 100%  100%  Weight:   54.1 kg (119 lb 5 oz)   Height:        Intake/Output Summary (Last 24 hours) at 10/19/2017 0810 Last data filed at 10/19/2017 6295 Gross per 24 hour  Intake 1178.75 ml  Output 201 ml  Net 977.75 ml   Filed Weights   10/16/17 0500 10/17/17 0351 10/19/17 0500  Weight: 55 kg (121 lb 4.1 oz) 53.8 kg (118 lb 9.7 oz) 54.1 kg (119 lb 5 oz)    Examination:  General exam:NAD Respiratory system: Mild tachypnea, no wheezing.  Cardiovascular system: S 1, S 2 RRR tachycardia Gastrointestinal system: BS present, soft, nt Central nervous system: non focal.  Extremities: symmetric power.  Skin: No rashes, lesions or ulcers    Data Reviewed: I have personally reviewed following labs and imaging studies  CBC: Recent Labs  Lab 10/15/17 0344 10/16/17 0225 10/17/17 0346 10/18/17 0443 10/19/17 0518  WBC 15.8* 11.3* 7.7 7.0 6.8  NEUTROABS 14.4*  --   --   --   --   HGB 10.0* 9.1* 9.2* 8.5* 9.2*  HCT 32.1* 29.0* 29.6* 27.7* 30.4*  MCV 92.8 93.2 94.0 94.5 95.6  PLT 152 142* 132* 134* 284   Basic Metabolic Panel: Recent Labs  Lab 10/12/17 1642 10/13/17 0455 10/14/17 0050 10/15/17 0344 10/18/17 0443 10/18/17 1530 10/19/17 0518  NA  --   --  136 136  --  137  --   K  --   --  4.1 4.5  --  3.3*  --   CL  --   --  100* 98*  --  89*  --   CO2  --   --  27 31  --  38*  --   GLUCOSE  --   --  131* 118*  --  135*  --   BUN  --   --  27* 23*  --  15  --   CREATININE  --   --  1.09* 1.14*  --  1.25*  --   CALCIUM  --   --  6.5* 6.9*  --  8.7*  --   MG 1.4* 1.8 1.5*  --  1.4*  --  1.7  PHOS 3.7 3.6  --   --  3.7  --  3.7   GFR: Estimated Creatinine Clearance: 32.2 mL/min (A) (by C-G formula based on SCr of 1.25  mg/dL (H)). Liver Function Tests: No results for input(s): AST, ALT, ALKPHOS, BILITOT, PROT, ALBUMIN in the last 168 hours. No results for input(s): LIPASE, AMYLASE in the last 168 hours. No results for input(s): AMMONIA in the last 168 hours. Coagulation Profile: No results for input(s): INR, PROTIME in the last 168 hours. Cardiac Enzymes: No results for input(s): CKTOTAL, CKMB, CKMBINDEX, TROPONINI in the last 168 hours. BNP (last 3 results) Recent Labs    10/24/16 1638  PROBNP  282.0*   HbA1C: No results for input(s): HGBA1C in the last 72 hours. CBG: Recent Labs  Lab 10/14/17 2347 10/15/17 0321 10/15/17 0752 10/15/17 1144 10/15/17 1613  GLUCAP 119* 142* 85 103* 102*   Lipid Profile: No results for input(s): CHOL, HDL, LDLCALC, TRIG, CHOLHDL, LDLDIRECT in the last 72 hours. Thyroid Function Tests: No results for input(s): TSH, T4TOTAL, FREET4, T3FREE, THYROIDAB in the last 72 hours. Anemia Panel: Recent Labs    10/18/17 1530  VITAMINB12 1,055*  FOLATE 26.0  FERRITIN 141  TIBC 252  IRON 30  RETICCTPCT 3.5*   Sepsis Labs: No results for input(s): PROCALCITON, LATICACIDVEN in the last 168 hours.  Recent Results (from the past 240 hour(s))  MRSA PCR Screening     Status: None   Collection Time: 10/10/17  2:02 PM  Result Value Ref Range Status   MRSA by PCR NEGATIVE NEGATIVE Final    Comment:        The GeneXpert MRSA Assay (FDA approved for NASAL specimens only), is one component of a comprehensive MRSA colonization surveillance program. It is not intended to diagnose MRSA infection nor to guide or monitor treatment for MRSA infections.   Culture, respiratory (NON-Expectorated)     Status: None   Collection Time: 10/10/17  5:53 PM  Result Value Ref Range Status   Specimen Description TRACHEAL ASPIRATE  Final   Special Requests NONE  Final   Gram Stain   Final    ABUNDANT WBC PRESENT,BOTH PMN AND MONONUCLEAR RARE SQUAMOUS EPITHELIAL CELLS PRESENT FEW  GRAM NEGATIVE RODS FEW GRAM POSITIVE COCCI IN PAIRS IN CHAINS    Culture   Final    RARE FUNGUS (MOLD) ISOLATED, PROBABLE CONTAMINANT/COLONIZER (SAPROPHYTE). CONTACT MICROBIOLOGY IF FURTHER IDENTIFICATION REQUIRED [phone].   Report Status 10/13/2017 FINAL  Final  Culture, Urine     Status: None   Collection Time: 10/10/17  6:38 PM  Result Value Ref Range Status   Specimen Description URINE, CATHETERIZED  Final   Special Requests NONE  Final   Culture NO GROWTH  Final   Report Status 10/11/2017 FINAL  Final  Culture, blood (routine x 2)     Status: None   Collection Time: 10/10/17  6:41 PM  Result Value Ref Range Status   Specimen Description BLOOD LEFT ANTECUBITAL  Final   Special Requests IN PEDIATRIC BOTTLE Blood Culture adequate volume  Final   Culture NO GROWTH 5 DAYS  Final   Report Status 10/15/2017 FINAL  Final  Culture, blood (routine x 2)     Status: None   Collection Time: 10/10/17  6:41 PM  Result Value Ref Range Status   Specimen Description BLOOD BLOOD LEFT HAND  Final   Special Requests IN PEDIATRIC BOTTLE Blood Culture adequate volume  Final   Culture NO GROWTH 5 DAYS  Final   Report Status 10/15/2017 FINAL  Final  Respiratory Panel by PCR     Status: None   Collection Time: 10/17/17  6:41 PM  Result Value Ref Range Status   Adenovirus NOT DETECTED NOT DETECTED Final   Coronavirus 229E NOT DETECTED NOT DETECTED Final   Coronavirus HKU1 NOT DETECTED NOT DETECTED Final   Coronavirus NL63 NOT DETECTED NOT DETECTED Final   Coronavirus OC43 NOT DETECTED NOT DETECTED Final   Metapneumovirus NOT DETECTED NOT DETECTED Final   Rhinovirus / Enterovirus NOT DETECTED NOT DETECTED Final   Influenza A NOT DETECTED NOT DETECTED Final   Influenza B NOT DETECTED NOT DETECTED Final   Parainfluenza  Virus 1 NOT DETECTED NOT DETECTED Final   Parainfluenza Virus 2 NOT DETECTED NOT DETECTED Final   Parainfluenza Virus 3 NOT DETECTED NOT DETECTED Final   Parainfluenza Virus 4  NOT DETECTED NOT DETECTED Final   Respiratory Syncytial Virus NOT DETECTED NOT DETECTED Final   Bordetella pertussis NOT DETECTED NOT DETECTED Final   Chlamydophila pneumoniae NOT DETECTED NOT DETECTED Final   Mycoplasma pneumoniae NOT DETECTED NOT DETECTED Final         Radiology Studies: No results found.      Scheduled Meds: . apixaban  10 mg Oral BID   Followed by  . [START ON 10/25/2017] apixaban  5 mg Oral BID  . feeding supplement (ENSURE ENLIVE)  237 mL Oral Q24H  . flecainide  50 mg Oral BID  . metoprolol tartrate  50 mg Oral Q6H  . potassium chloride  40 mEq Oral Once  . predniSONE  50 mg Oral Q6H  . umeclidinium-vilanterol  1 puff Inhalation Daily   Continuous Infusions: . sodium chloride 75 mL/hr at 10/18/17 2228  . magnesium sulfate 1 - 4 g bolus IVPB       LOS: 9 days    Time spent: 35 minutes.     Elmarie Shiley, MD [facility] Pager [phone]  If 7PM-7AM, please contact night-coverage [email or web address] Password [email or web address] 10/19/2017, 8:10 AM

## 2017-10-20 LAB — PHOSPHORUS: Phosphorus: 2.4 mg/dL — ABNORMAL LOW (ref 2.5–4.6)

## 2017-10-20 LAB — BASIC METABOLIC PANEL
Anion gap: 8 (ref 5–15)
BUN: 21 mg/dL — AB (ref 6–20)
CHLORIDE: 93 mmol/L — AB (ref 101–111)
CO2: 34 mmol/L — AB (ref 22–32)
CREATININE: 1.16 mg/dL — AB (ref 0.44–1.00)
Calcium: 7.8 mg/dL — ABNORMAL LOW (ref 8.9–10.3)
GFR calc Af Amer: 51 mL/min — ABNORMAL LOW (ref >60)
GFR calc non Af Amer: 44 mL/min — ABNORMAL LOW (ref >60)
Glucose, Bld: 115 mg/dL — ABNORMAL HIGH (ref 65–99)
POTASSIUM: 3.4 mmol/L — AB (ref 3.5–5.1)
Sodium: 135 mmol/L (ref 135–145)

## 2017-10-20 LAB — MAGNESIUM: Magnesium: 2.2 mg/dL (ref 1.7–2.4)

## 2017-10-20 LAB — METANEPHRINES, PLASMA
Metanephrine, Free: 29 pg/mL (ref 0–62)
Normetanephrine, Free: 276 pg/mL — ABNORMAL HIGH (ref 0–145)

## 2017-10-20 LAB — CBC
HCT: 26.2 % — ABNORMAL LOW (ref 36.0–46.0)
Hemoglobin: 8.2 g/dL — ABNORMAL LOW (ref 12.0–15.0)
MCH: 30 pg (ref 26.0–34.0)
MCHC: 31.3 g/dL (ref 30.0–36.0)
MCV: 96 fL (ref 78.0–100.0)
PLATELETS: 168 10*3/uL (ref 150–400)
RBC: 2.73 MIL/uL — ABNORMAL LOW (ref 3.87–5.11)
RDW: 14.1 % (ref 11.5–15.5)
WBC: 10.9 10*3/uL — ABNORMAL HIGH (ref 4.0–10.5)

## 2017-10-20 LAB — METANEPHRINES, URINE, 24 HOUR
METANEPH TOTAL UR: 20 ug/L
Metanephrines, 24H Ur: 45 ug/24 hr (ref 45–290)
NORMETANEPHRINE 24H UR: 247 ug/(24.h) (ref 82–500)
Normetanephrine, Ur: 111 ug/L
Total Volume: 2225

## 2017-10-20 MED ORDER — METOPROLOL TARTRATE 50 MG PO TABS: 50.0000 mg | ORAL_TABLET | Freq: Four times a day (QID) | ORAL | 0 refills | Status: AC

## 2017-10-20 MED ORDER — K PHOS MONO-SOD PHOS DI & MONO 155-852-130 MG PO TABS: 500.0000 mg | ORAL_TABLET | Freq: Two times a day (BID) | ORAL | 0 refills | Status: AC

## 2017-10-20 MED ORDER — ENSURE ENLIVE PO LIQD: 237.0000 mL | ORAL | 12 refills | Status: AC

## 2017-10-20 MED ORDER — K PHOS MONO-SOD PHOS DI & MONO 155-852-130 MG PO TABS
500.0000 mg | ORAL_TABLET | Freq: Two times a day (BID) | ORAL | Status: DC
  Administered 2017-10-20: 500 mg via ORAL
  Filled 2017-10-20: qty 2

## 2017-10-20 NOTE — Progress Notes (Signed)
Progress Note  Patient Name: Carol Carson Date of Encounter: 10/20/2017  Primary Cardiologist: Turner/Camnitz  Subjective   Feels great wants to go home has converted to NSR   Inpatient Medications    Scheduled Meds: . feeding supplement (ENSURE ENLIVE)  237 mL Oral Q24H  . flecainide  50 mg Oral BID  . metoprolol tartrate  50 mg Oral Q6H  . umeclidinium-vilanterol  1 puff Inhalation Daily   Continuous Infusions: . sodium chloride 75 mL/hr at 10/19/17 1641   PRN Meds: ALPRAZolam, levalbuterol, metoprolol tartrate   Vital Signs    Vitals:   10/19/17 2212 10/20/17 0451 10/20/17 0621 10/20/17 0836  BP: (!) 129/55 134/73    Pulse: (!) 124 (!) 120  (!) 121  Resp: 18 18  18   Temp: 97.7 F (36.5 C) 98.4 F (36.9 C)    TempSrc: Oral Oral    SpO2: 100% 93%  95%  Weight:   114 lb 6.4 oz (51.9 kg)   Height:        Intake/Output Summary (Last 24 hours) at 10/20/2017 0901 Last data filed at 10/20/2017 0518 Gross per 24 hour  Intake 2333.75 ml  Output -  Net 2333.75 ml   Filed Weights   10/17/17 0351 10/19/17 0500 10/20/17 0621  Weight: 118 lb 9.7 oz (53.8 kg) 119 lb 5 oz (54.1 kg) 114 lb 6.4 oz (51.9 kg)    Telemetry    NSR rates 75-88 - Personally Reviewed  ECG    No new - Personally Reviewed  Physical Exam   GEN: No acute distress.   Neck: No JVD Cardiac: RRR, no murmurs, rubs, or gallops.  Respiratory: Clear to auscultation bilaterally. GI: Soft, nontender, non-distended  MS: No edema; No deformity. Neuro:  Nonfocal  Psych: Normal affect   Labs    Chemistry Recent Labs  Lab 10/15/17 0344 10/18/17 1530 10/19/17 0718  NA 136 137 136  K 4.5 3.3* 3.9  CL 98* 89* 90*  CO2 31 38* 37*  GLUCOSE 118* 135* 142*  BUN 23* 15 14  CREATININE 1.14* 1.25* 1.00  CALCIUM 6.9* 8.7* 8.0*  GFRNONAA 45* 40* 53*  GFRAA 52* 47* >60  ANIONGAP 7 10 9      Hematology Recent Labs  Lab 10/18/17 0443 10/18/17 1530 10/19/17 0518 10/20/17 0431  WBC 7.0   --  6.8 10.9*  RBC 2.93* 3.24* 3.18* 2.73*  HGB 8.5*  --  9.2* 8.2*  HCT 27.7*  --  30.4* 26.2*  MCV 94.5  --  95.6 96.0  MCH 29.0  --  28.9 30.0  MCHC 30.7  --  30.3 31.3  RDW 13.6  --  13.4 14.1  PLT 134*  --  173 168    Cardiac EnzymesNo results for input(s): TROPONINI in the last 168 hours. No results for input(s): TROPIPOC in the last 168 hours.   BNPNo results for input(s): BNP, PROBNP in the last 168 hours.   DDimer No results for input(s): DDIMER in the last 168 hours.   Radiology    Ct Angio Chest Pe W Or Wo Contrast  Result Date: 10/19/2017 CLINICAL DATA:  Hypoxemia EXAM: CT ANGIOGRAPHY CHEST WITH CONTRAST TECHNIQUE: Multidetector CT imaging of the chest was performed using the standard protocol during bolus administration of intravenous contrast. Multiplanar CT image reconstructions and MIPs were obtained to evaluate the vascular anatomy. CONTRAST:  12mL ISOVUE-370 IOPAMIDOL (ISOVUE-370) INJECTION 76% COMPARISON:  10/12/2017 FINDINGS: Cardiovascular: Thoracic aorta demonstrates atherosclerotic calcifications. No aneurysmal dilatation or dissection  is seen. Mild coronary calcifications are noted. The pulmonary artery is well visualize with a normal branching pattern. No filling defects are identified to suggest pulmonary embolism. No significant central pulmonary arterial enlargement is noted. No pericardial effusion is seen. Mediastinum/Nodes: The thoracic inlet is within normal limits. No significant mediastinal or hilar adenopathy is noted. The esophagus as visualized is within normal limits. Lungs/Pleura: Lungs demonstrate biapical pleuroparenchymal scarring similar to that seen on the prior exam. The pleural effusions seen on the prior exam have resolved in the interval. Scattered calcifications consistent with prior granulomatous disease are again noted. The nodule seen in the left lower lobe is stable in appearance measuring approximately 6 mm. The nodularity seen in the medial  aspect of the left lower lobe on the prior exam has resolved and was likely related to inflammatory change. Mild scattered areas of chronic scarring are again seen. No other definitive nodules are noted. Upper Abdomen: Visualized upper abdomen is within normal limits. Musculoskeletal: Degenerative changes of the thoracic spine are noted. No compression deformities are seen. Chronic Schmorl's nodes at T12 is noted superiorly. Review of the MIP images confirms the above findings. IMPRESSION: Stable 6 mm nodule in the left lower lobe. Non-contrast chest CT at 6-12 months is recommended. If the nodule is stable at time of repeat CT, then future CT at 18-24 months (from today's scan) is considered optional for low-risk patients, but is recommended for high-risk patients. This recommendation follows the consensus statement: Guidelines for Management of Incidental Pulmonary Nodules Detected on CT Images: From the Fleischner Society 2017; Radiology 2017; 284:228-243. This follows the previously made recommendations. No evidence of pulmonary emboli. Resolution of previous effusions and atelectasis. The nodular changes in the medial aspect of the left lower lobe have resolved and were likely postinflammatory given the short-term the resolution. Previously seen ascites has resolved as well. Aortic Atherosclerosis (ICD10-I70.0). Electronically Signed   By: Inez Catalina M.D.   On: 10/19/2017 12:52    Cardiac Studies   Venous dopplers bil 10/18/17 No evidence of deep vein thrombosis or baker's cysts bilaterally.  VQ 10/15/17  IMPRESSION: There are multiple matching ventilation and profusion defects which for extensive in the upper lobes bilaterally and relatively minor elsewhere. There is no appreciable ventilation/ perfusion mismatch. There is underlying emphysematous change per chest radiograph and chest CT studies. The degree perfusion abnormality places the study in an intermediate category for pulmonary  embolus. The lack of appreciable ventilation/perfusion mismatch places this study at the lower end of the spectrum of the intermediate category for pulmonary embolus, at approximately 20% probability based on PIOPED II criteria.  Patient Profile     76 y.o. female history of tachycardia, though sinus tachycardia as well as SVT on ventilator after being unresponsive at home. She has since been extubated successfully though still having episodes of ST.  Now back on flecainide     Assessment & Plan   atrial tachycardia/PSVT  Converted with flecainide in NSR rates 70-80 this am ready for d/c home   Abnormal VQ with neg venous dopplers are legs. CTA negative for PE yesterday   Right heart failure, improved continue current meds  Has outpatient f/u with Dr Camnitz/Turner    [place]

## 2017-10-20 NOTE — Discharge Summary (Signed)
Physician Discharge Summary  Carol Carson EAV:[id number] DOB: 01/29/1941 DOA: 10/10/2017  PCP: Leeroy Cha, MD  Admit date: 10/10/2017 Discharge date: 10/20/2017  Admitted From: Home  Disposition:  Home   Recommendations for Outpatient Follow-up:  1. Follow up with PCP in 1-2 weeks 2. Please obtain BMP/CBC in one week 3. Needs follow up for pulmonary nodule.    Home Health: yes.   Discharge Condition: stable.  CODE STATUS: full code.  Diet recommendation: Heart Healthy  Brief/Interim Summary: 76 year old female with a past medical history significant for COPD who was intubated in the [facility] emergency department on October 10, 2017 for hypercapnic respiratory failure. She was initially hypothermic but subsequently had a temperature and an elevated lactate. She was extensively volume loaded and empirically was started on Levaquin in addition to systemic steroids and bronchodilators. She was difficult to wean and became overtly apneic on one attempt. All opiates were held and her FiO2 reduced and she was successfully extubated 11/7. She has a chronic sinus tachycardia now being treated with beta blockers. 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 PA pressure was estimated at 35. Nuclear study 10/2016 did not show fixed or reversible ischemia. 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 D dimer this admission was positive, dopplers negative, and V/Q low prob  EVENTS 10/10/2017- admit 11/5 - CT chest -Previous nodular opacity abutting the pleura in the left upper lobe is now diffusely calcified and felt to represent benign etiology. There are new nodular opacities on the left, largest measuring 7 x 6 mm in the lateral segment left lower lobe. Non-contrast chest CT at 6-12 months is recommended. If the nodule is stable at time of repeat CT, then future CT at 18-24 months (from today's scan) is considered optional for low-risk patients, but is recommended for  high-risk patients. This recommendation follows the consensus statement: Guidelines for Management of Incidental Pulmonary Nodules Detected on CT Images: From the Fleischner Society 2017; Radiology 2017; [id number]. 11/7 - extubated 11/8 - MRI brain - chroic ischemic changes only 10/16/17 -She is very alert and denying dyspnea againthis morning. She is sitting up in a chair reading.She is very anxious      Assessment & Plan:   Active Problems:   COPD exacerbation (HCC)   Acute on chronic respiratory failure with hypoxia and hypercapnia (HCC)   Coma (HCC)   Tachycardia   Physical deconditioning   Acute on chronic respiratory failure with hypoxia (HCC)  Acute on chronic Respiratory Failure, hypoxia and hypercapnia. COPD>  In retrospect no hx of AECOPD but presented like that.  scheduled BD - xopenex and atrovent (ANORO at home) PCO at 72, this is compensated PH 7.3. 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 hydrate patient overnight, will premedicate with prednisone.  Discussed with Dr Johnsie Cancel, patient does not need anticoagulation from cardiac perspective. He agree with ruling out PE CT angio negative for PE. Renal function stable.    Coma ([place]) Resolved. Hyprecapnia only issue found as etiology.  COPD exacerbation ([place]) Will hold off on abx and steroid Rx given improvement though this might have been the inciting cause resume annoro.   Tachycardia/PSVT On  flecanidie Continue lopressor  Cards to adjust futher improved.   Hypomagnesemia;  Replete IV.   Physical deconditioning PT consult  Anemia; hb stable.  Consider iron trial.   Hypokalemia; replete orally , replace phosphorus.  Hypophosphatemia/ replete oral.      Discharge  Diagnoses:  Active Problems:   COPD exacerbation (HCC)   Acute on chronic respiratory failure with hypoxia and hypercapnia (HCC)   Coma (HCC)   Tachycardia    Physical deconditioning   Acute on chronic respiratory failure with hypoxia [facility])    Discharge Instructions  Discharge Instructions    Diet - low sodium heart healthy   Complete by:  As directed    Increase activity slowly   Complete by:  As directed      Allergies as of 10/20/2017      Reactions   Contrast Media [iodinated Diagnostic Agents] Other (See Comments)   LOW KIDNEY FUNCTION///NEED TO CHECK WITH NEPHROLOGIST BEFORE PROCEDURE.   Biaxin [clarithromycin] Hives   Penicillins Hives   Has patient had a PCN reaction causing immediate rash, facial/tongue/throat swelling, SOB or lightheadedness with hypotension: Yes Has patient had a PCN reaction causing severe rash involving mucus membranes or skin necrosis: No Has patient had a PCN reaction that required hospitalization: No Has patient had a PCN reaction occurring within the last 10 years: No If all of the above answers are "NO", then may proceed with Cephalosporin use.   Sulfa Antibiotics Rash      Medication List    STOP taking these medications   amLODipine 5 MG tablet Commonly known as:  NORVASC   atenolol 25 MG tablet Commonly known as:  TENORMIN     TAKE these medications   ALPRAZolam 0.25 MG tablet Commonly known as:  XANAX Take 0.25 mg by mouth 2 (two) times daily as needed for anxiety or sleep.   cholecalciferol 1000 units tablet Commonly known as:  VITAMIN D Take 1,000 Units by mouth daily.   feeding supplement (ENSURE ENLIVE) Liqd Take 237 mLs daily by mouth.   ferrous sulfate 325 (65 FE) MG tablet Take 325 mg daily by mouth.   flecainide 50 MG tablet Commonly known as:  TAMBOCOR Take 1 tablet (50 mg total) by mouth 2 (two) times daily.   levalbuterol 45 MCG/ACT inhaler Commonly known as:  XOPENEX HFA Inhale 2 puffs into the lungs every 4 (four) hours as needed for wheezing or shortness of breath.   metoprolol tartrate 50 MG tablet Commonly known as:  LOPRESSOR Take 1 tablet (50 mg total)  every 6 (six) hours by mouth.   multivitamin with minerals Tabs tablet Take 1 tablet daily by mouth. Centrum   OXYGEN Inhale 2-4 L See admin instructions into the lungs. 2lpm with rest, 4 with exertion and 3 lpm with sleep   phosphorus 155-852-130 MG tablet Commonly known as:  K PHOS NEUTRAL Take 2 tablets (500 mg total) 2 (two) times daily by mouth.   rOPINIRole 0.25 MG tablet Commonly known as:  REQUIP Take 1 tablet (0.25 mg total) by mouth 2 (two) times daily.   simvastatin 20 MG tablet Commonly known as:  ZOCOR TAKE 1 TABLET (20 MG) BY MOUTH IN THE EVENING   SUPER B COMPLEX PO Take 1 tablet daily by mouth.   umeclidinium-vilanterol 62.5-25 MCG/INH Aepb Commonly known as:  ANORO ELLIPTA Inhale 1 puff into the lungs daily.            Durable Medical Equipment  (From admission, onward)        Start     Ordered   10/20/17 0955  For home use only DME 3 n 1  Once     10/20/17 0954   10/19/17 1447  For home use only DME Walker rolling  Once  Question:  Patient needs a walker to treat with the following condition  Answer:  Weakness   10/19/17 [place] Follow up.   Why:  rolling walker will be delivered to bedside prior to discharge Contact information: [place] [address] [phone]        [facility] Follow up.   Specialty:  [place] Why:  home health services arranged, office will call and set up home visits Contact information: [place] [address] [phone]          Allergies  Allergen Reactions  . Contrast Media [Iodinated Diagnostic Agents] Other (See Comments)    LOW KIDNEY FUNCTION///NEED TO CHECK WITH NEPHROLOGIST BEFORE PROCEDURE.  . Biaxin [Clarithromycin] Hives  . Penicillins Hives    Has patient had a PCN reaction causing immediate rash, facial/tongue/throat swelling, SOB or lightheadedness with  hypotension: Yes Has patient had a PCN reaction causing severe rash involving mucus membranes or skin necrosis: No Has patient had a PCN reaction that required hospitalization: No Has patient had a PCN reaction occurring within the last 10 years: No If all of the above answers are "NO", then may proceed with Cephalosporin use.  . Sulfa Antibiotics Rash    Consultations:  Cardiology  CCM  Dr Web   Procedures/Studies: Dg Chest 2 View  Result Date: 10/15/2017 CLINICAL DATA:  Respiratory failure. Pt denies chest pain. Hx of COPD and HTN. Former smoker. EXAM: CHEST  2 VIEW COMPARISON:  Chest x-ray dated 10/12/2017, 06/28/2015 and 01/13/2013. FINDINGS: Heart size and mediastinal contours are stable. Atherosclerotic changes noted at the aortic arch. Lungs are hyperexpanded. Stable nodular scarring/fibrosis in the left upper lobe. No evidence of pneumonia or pulmonary edema. No pleural effusion or pneumothorax seen. No acute or suspicious osseous finding IMPRESSION: 1. No active cardiopulmonary disease. No evidence of pneumonia or pulmonary edema. 2. Aortic atherosclerosis. Electronically Signed   By: Franki Cabot M.D.   On: 10/15/2017 16:02   Ct Head Wo Contrast  Result Date: 10/10/2017 CLINICAL DATA:  Unresponsive EXAM: CT HEAD WITHOUT CONTRAST TECHNIQUE: Contiguous axial images were obtained from the base of the skull through the vertex without intravenous contrast. COMPARISON:  None. FINDINGS: Brain: There is low-density within the anterior limb of the left internal capsule. There is also low-density in the anterior external capsule. The insular cortex is intact. There is no obvious mass effect or hemorrhage. Nonspecific tiny calcification is present in the right occipital cortex. Chronic ischemic changes in the periventricular white matter are superimposed. Vascular: No hyperdense vessel or unexpected calcification. Skull: Cranium is intact. Sinuses/Orbits: There is mucosal thickening in the  anterior ethmoid air cells. Maxillary sinus, sphenoid sinus, and mastoid air cells are clear. Frontal sinuses are clear. Orbits are within normal limits for age. Other: Noncontributory. IMPRESSION: There is low-density within the left internal and external capsule as described. Age indeterminate infarcts are suggested. Please note that the insular cortex is within normal limits and there is no evidence of hyperdense MCA. MRI may be helpful to further characterize. Superimpose chronic ischemic changes are noted. Electronically Signed   By: Marybelle Killings M.D.   On: 10/10/2017 11:26   Ct Chest Wo Contrast  Result Date: 10/12/2017 CLINICAL DATA:  Shortness of breath and hypoxia. EXAM: CT CHEST WITHOUT CONTRAST TECHNIQUE: Multidetector CT imaging of the chest was performed following the standard protocol without IV contrast. COMPARISON:  Chest radiograph October 12, 2017; chest CT August 13, 2006 FINDINGS: Cardiovascular: There is no appreciable thoracic aortic aneurysm. There is calcification at the origins and mid portions of the visualized great vessels without apparent hemodynamically significant obstruction in these vessels. There is calcification in the thoracic aorta. There are multiple foci of coronary artery calcification. There is slight pericardial thickening without well-defined pericardial effusion. Mediastinum/Nodes: There is mild inhomogeneity in the visualized thyroid without dominant thyroid mass evident. There is no evident thoracic adenopathy. Nasogastric tube extends into the stomach. There is no demonstrable thyroid wall thickening on this noncontrast enhanced study. Lungs/Pleura: The patient is intubated with endotracheal tube in the mid trachea. There is mild debris in the posterior mid trachea, possibly secondary to the intubation. No pneumothorax. There is scarring and pleural thickening in each lung apex. There is an area of opacity abutting the pleura in the anterior segment of the left  upper lobe measuring 1.3 x 1.1 cm which now contains diffuse calcification. There is a nodular opacity in the lateral segment of the left lower lobe seen on axial slice 74 series 4 measuring 7 x 6 mm there are pleural effusions bilaterally with bibasilar atelectatic type change. There are areas of scarring in the right middle lobe and inferior lingular regions. There is a stable 3 mm nodular opacity in the inferior lingula seen on axial slice 95 series 4. There is a nodular appearing opacity in the superior segment of the left lower lobe seen on axial slice 86 series 4 measuring 5 x 5 mm which may represent localized atelectasis as opposed to in actual nodule. Which was not present on prior study. Upper Abdomen: In the visualized upper abdomen, there is atherosclerotic calcification in the aorta. Mild ascites is appreciable adjacent to the liver. Musculoskeletal: There are foci of degenerative change in the thoracic spine. There is slight anterior wedging at T12 along the leftward aspect of superior endplate. This appearance is stable compared to prior CT examination. IMPRESSION: 1. Bilateral pleural effusions with bibasilar atelectasis. No well-defined airspace consolidation. 2. Previous nodular opacity abutting the pleura in the left upper lobe is now diffusely calcified and felt to represent benign etiology. There are new nodular opacities on the left, largest measuring 7 x 6 mm in the lateral segment left lower lobe. Non-contrast chest CT at 6-12 months is recommended. If the nodule is stable at time of repeat CT, then future CT at 18-24 months (from today's scan) is considered optional for low-risk patients, but is recommended for high-risk patients. This recommendation follows the consensus statement: Guidelines for Management of Incidental Pulmonary Nodules Detected on CT Images: From the Fleischner Society 2017; Radiology 2017; 284:228-243. 3.  No evident adenopathy. 4. Endotracheal tube tip in mid trachea.  There is debris along the dependent portion of the trachea in this area, potentially due to intubation. Nasogastric tube extends in the stomach. No pneumothorax. 5. There is aortic atherosclerosis. There are multiple foci of coronary artery calcification as well as foci of great vessel calcification. 6.  Mild upper abdominal ascites, incompletely visualized. Aortic Atherosclerosis (ICD10-I70.0). Electronically Signed   By: Lowella Grip III M.D.   On: 10/12/2017 11:09   Ct Angio Chest Pe W Or Wo Contrast  Result Date: 10/19/2017 CLINICAL DATA:  Hypoxemia EXAM: CT ANGIOGRAPHY CHEST WITH CONTRAST TECHNIQUE: Multidetector CT imaging of the chest was performed using the standard protocol during bolus administration of intravenous contrast. Multiplanar CT image reconstructions and MIPs were obtained to evaluate the vascular anatomy.  CONTRAST:  2mL ISOVUE-370 IOPAMIDOL (ISOVUE-370) INJECTION 76% COMPARISON:  10/12/2017 FINDINGS: Cardiovascular: Thoracic aorta demonstrates atherosclerotic calcifications. No aneurysmal dilatation or dissection is seen. Mild coronary calcifications are noted. The pulmonary artery is well visualize with a normal branching pattern. No filling defects are identified to suggest pulmonary embolism. No significant central pulmonary arterial enlargement is noted. No pericardial effusion is seen. Mediastinum/Nodes: The thoracic inlet is within normal limits. No significant mediastinal or hilar adenopathy is noted. The esophagus as visualized is within normal limits. Lungs/Pleura: Lungs demonstrate biapical pleuroparenchymal scarring similar to that seen on the prior exam. The pleural effusions seen on the prior exam have resolved in the interval. Scattered calcifications consistent with prior granulomatous disease are again noted. The nodule seen in the left lower lobe is stable in appearance measuring approximately 6 mm. The nodularity seen in the medial aspect of the left lower lobe on  the prior exam has resolved and was likely related to inflammatory change. Mild scattered areas of chronic scarring are again seen. No other definitive nodules are noted. Upper Abdomen: Visualized upper abdomen is within normal limits. Musculoskeletal: Degenerative changes of the thoracic spine are noted. No compression deformities are seen. Chronic Schmorl's nodes at T12 is noted superiorly. Review of the MIP images confirms the above findings. IMPRESSION: Stable 6 mm nodule in the left lower lobe. Non-contrast chest CT at 6-12 months is recommended. If the nodule is stable at time of repeat CT, then future CT at 18-24 months (from today's scan) is considered optional for low-risk patients, but is recommended for high-risk patients. This recommendation follows the consensus statement: Guidelines for Management of Incidental Pulmonary Nodules Detected on CT Images: From the Fleischner Society 2017; Radiology 2017; 284:228-243. This follows the previously made recommendations. No evidence of pulmonary emboli. Resolution of previous effusions and atelectasis. The nodular changes in the medial aspect of the left lower lobe have resolved and were likely postinflammatory given the short-term the resolution. Previously seen ascites has resolved as well. Aortic Atherosclerosis (ICD10-I70.0). Electronically Signed   By: Inez Catalina M.D.   On: 10/19/2017 12:52   Mr Brain Wo Contrast  Result Date: 10/15/2017 CLINICAL DATA:  Altered mental status EXAM: MRI HEAD WITHOUT CONTRAST TECHNIQUE: Multiplanar, multiecho pulse sequences of the brain and surrounding structures were obtained without intravenous contrast. COMPARISON:  Head CT 10/10/2017 FINDINGS: Brain: The midline structures are normal. There is no acute infarct or acute hemorrhage. No mass lesion, hydrocephalus, dural abnormality or extra-axial collection. There is multifocal white matter hyperintensity suggesting chronic ischemic microangiopathy. No age-advanced or  lobar predominant atrophy. No chronic microhemorrhage or superficial siderosis. Vascular: Major intracranial arterial and venous sinus flow voids are preserved. Skull and upper cervical spine: The visualized skull base, calvarium, upper cervical spine and extracranial soft tissues are normal. Sinuses/Orbits: No fluid levels or advanced mucosal thickening. No mastoid or middle ear effusion. Normal orbits. IMPRESSION: No acute intracranial abnormality. Sequelae of chronic ischemic microangiopathy. Electronically Signed   By: Ulyses Jarred M.D.   On: 10/15/2017 05:56   Nm Pulmonary Perf And Vent  Result Date: 10/15/2017 CLINICAL DATA:  Positive D-dimer with difficulty breathing EXAM: NUCLEAR MEDICINE VENTILATION - PERFUSION LUNG SCAN VIEWS: Anterior, posterior, left lateral, right lateral, RPO, LPO, RAO, LAO -ventilation and perfusion RADIOPHARMACEUTICALS:  31.4 mCi Technetium-63m DTPA aerosol inhalation and 4.1 mCi Technetium-34m MAA IV COMPARISON:  Chest radiograph October 15, 2017 ; chest CT October 12, 2017 FINDINGS: Ventilation: There are near complete segmental areas of photopenia involving anterior and posterior  segments of each upper lobe. There are scattered subsegmental lower lobe areas of photopenia. Perfusion: On the perfusion study, there are photopenic areas in each upper lobe which match the ventilation defects. There are matching subsegmental defects in each lower lobe. There is no appreciable ventilation/perfusion mismatch. Chest radiograph and chest CT shows evidence suggesting emphysema without widespread bullous disease. IMPRESSION: There are multiple matching ventilation and profusion defects which for extensive in the upper lobes bilaterally and relatively minor elsewhere. There is no appreciable ventilation/ perfusion mismatch. There is underlying emphysematous change per chest radiograph and chest CT studies. The degree perfusion abnormality places the study in an intermediate category for  pulmonary embolus. The lack of appreciable ventilation/perfusion mismatch places this study at the lower end of the spectrum of the intermediate category for pulmonary embolus, at approximately 20% probability based on PIOPED II criteria. Electronically Signed   By: Lowella Grip III M.D.   On: 10/15/2017 16:00   Dg Chest Port 1 View  Result Date: 10/12/2017 CLINICAL DATA:  Thoracic aortic atherosclerosis. EXAM: PORTABLE CHEST 1 VIEW COMPARISON:  Chest x-ray of October 10, 2017 FINDINGS: The lungs remain hyperinflated. There is a trace of blunting of the left lateral costophrenic angle. There is a stable 10 x 13 mm radiodensity peripherally in the left upper lobe which appears calcified. The heart and pulmonary vascularity are normal. There is calcification in the wall of the aortic arch. The endotracheal tube tip projects 5.9 cm above the carina. The esophagogastric tube tip in proximal port project below the GE junction. IMPRESSION: No acute pneumonia nor CHF. Chronic hyperinflation and interstitial prominence consistent with COPD. Possible trace left pleural effusion. Evidence of previous granulomatous disease. The support tubes are in reasonable position. Electronically Signed   By: David  Martinique M.D.   On: 10/12/2017 08:09   Dg Chest Portable 1 View  Result Date: 10/10/2017 CLINICAL DATA:  76 year old female with a history of intubation EXAM: PORTABLE CHEST 1 VIEW COMPARISON:  09/15/2016, 06/28/2015 FINDINGS: Cardiomediastinal silhouette unchanged in size and contour. No evidence of central vascular congestion. Diffuse coarsening of interstitial markings, similar to comparison chest x-rays. No new confluent airspace disease. Architectural distortion at the left lung base, similar to prior. No large pleural effusion. Hyperexpansion with relative flattening of the hemidiaphragms, compatible with emphysema. Calcified nodule the left upper chest is again noted, potentially involving the rib. Interval  placement of endotracheal tube, which terminates suitably above the carina, approximately 5.6 cm. Interval placement of gastric tube, terminating out of the field of view. IMPRESSION: Chronic lung changes and emphysema without evidence of superimposed acute cardiopulmonary disease. Interval placement of endotracheal tube which terminates suitably above the carina. Interval placement gastric tube terminating Electronically Signed   By: Corrie Mckusick D.O.   On: 10/10/2017 10:35   Dg Abd Portable 1 View  Result Date: 10/10/2017 CLINICAL DATA:  76 year old female with recent intubation. EXAM: PORTABLE ABDOMEN - 1 VIEW COMPARISON:  None. FINDINGS: Limited plain film of the upper abdomen demonstrates gastric tube with the side port terminating in the stomach lumen and the tip out of the field of view. Gas within stomach. IMPRESSION: Limited plain film of the upper abdomen demonstrates gastric tube terminating in the stomach, although the tip projects beyond the field of view of the study. Electronically Signed   By: Corrie Mckusick D.O.   On: 10/10/2017 10:36    (Echo, Carotid, EGD, Colonoscopy, ERCP)    Subjective:   Discharge Exam: Vitals:   10/20/17 0451 10/20/17  0836  BP: 134/73   Pulse: (!) 120 (!) 121  Resp: 18 18  Temp: 98.4 F (36.9 C)   SpO2: 93% 95%   Vitals:   10/19/17 2212 10/20/17 0451 10/20/17 0621 10/20/17 0836  BP: (!) 129/55 134/73    Pulse: (!) 124 (!) 120  (!) 121  Resp: 18 18  18   Temp: 97.7 F (36.5 C) 98.4 F (36.9 C)    TempSrc: Oral Oral    SpO2: 100% 93%  95%  Weight:   51.9 kg (114 lb 6.4 oz)   Height:        General: Pt is alert, awake, not in acute distress Cardiovascular: RRR, S1/S2 +, no rubs, no gallops Respiratory: CTA bilaterally, no wheezing, no rhonchi Abdominal: Soft, NT, ND, bowel sounds + Extremities: no edema, no cyanosis    The results of significant diagnostics from this hospitalization (including imaging, microbiology, ancillary and  laboratory) are listed below for reference.     Microbiology: Recent Results (from the past 240 hour(s))  MRSA PCR Screening     Status: None   Collection Time: 10/10/17  2:02 PM  Result Value Ref Range Status   MRSA by PCR NEGATIVE NEGATIVE Final    Comment:        The GeneXpert MRSA Assay (FDA approved for NASAL specimens only), is one component of a comprehensive MRSA colonization surveillance program. It is not intended to diagnose MRSA infection nor to guide or monitor treatment for MRSA infections.   Culture, respiratory (NON-Expectorated)     Status: None   Collection Time: 10/10/17  5:53 PM  Result Value Ref Range Status   Specimen Description TRACHEAL ASPIRATE  Final   Special Requests NONE  Final   Gram Stain   Final    ABUNDANT WBC PRESENT,BOTH PMN AND MONONUCLEAR RARE SQUAMOUS EPITHELIAL CELLS PRESENT FEW GRAM NEGATIVE RODS FEW GRAM POSITIVE COCCI IN PAIRS IN CHAINS    Culture   Final    RARE FUNGUS (MOLD) ISOLATED, PROBABLE CONTAMINANT/COLONIZER (SAPROPHYTE). CONTACT MICROBIOLOGY IF FURTHER IDENTIFICATION REQUIRED [phone].   Report Status 10/13/2017 FINAL  Final  Culture, Urine     Status: None   Collection Time: 10/10/17  6:38 PM  Result Value Ref Range Status   Specimen Description URINE, CATHETERIZED  Final   Special Requests NONE  Final   Culture NO GROWTH  Final   Report Status 10/11/2017 FINAL  Final  Culture, blood (routine x 2)     Status: None   Collection Time: 10/10/17  6:41 PM  Result Value Ref Range Status   Specimen Description BLOOD LEFT ANTECUBITAL  Final   Special Requests IN PEDIATRIC BOTTLE Blood Culture adequate volume  Final   Culture NO GROWTH 5 DAYS  Final   Report Status 10/15/2017 FINAL  Final  Culture, blood (routine x 2)     Status: None   Collection Time: 10/10/17  6:41 PM  Result Value Ref Range Status   Specimen Description BLOOD BLOOD LEFT HAND  Final   Special Requests IN PEDIATRIC BOTTLE Blood Culture adequate  volume  Final   Culture NO GROWTH 5 DAYS  Final   Report Status 10/15/2017 FINAL  Final  Respiratory Panel by PCR     Status: None   Collection Time: 10/17/17  6:41 PM  Result Value Ref Range Status   Adenovirus NOT DETECTED NOT DETECTED Final   Coronavirus 229E NOT DETECTED NOT DETECTED Final   Coronavirus HKU1 NOT DETECTED NOT DETECTED Final   Coronavirus  NL63 NOT DETECTED NOT DETECTED Final   Coronavirus OC43 NOT DETECTED NOT DETECTED Final   Metapneumovirus NOT DETECTED NOT DETECTED Final   Rhinovirus / Enterovirus NOT DETECTED NOT DETECTED Final   Influenza A NOT DETECTED NOT DETECTED Final   Influenza B NOT DETECTED NOT DETECTED Final   Parainfluenza Virus 1 NOT DETECTED NOT DETECTED Final   Parainfluenza Virus 2 NOT DETECTED NOT DETECTED Final   Parainfluenza Virus 3 NOT DETECTED NOT DETECTED Final   Parainfluenza Virus 4 NOT DETECTED NOT DETECTED Final   Respiratory Syncytial Virus NOT DETECTED NOT DETECTED Final   Bordetella pertussis NOT DETECTED NOT DETECTED Final   Chlamydophila pneumoniae NOT DETECTED NOT DETECTED Final   Mycoplasma pneumoniae NOT DETECTED NOT DETECTED Final     Labs: BNP (last 3 results) No results for input(s): BNP in the last 8760 hours. Basic Metabolic Panel: Recent Labs  Lab 10/14/17 0050 10/15/17 0344 10/18/17 0443 10/18/17 1530 10/19/17 0518 10/19/17 0718 10/20/17 0431  NA 136 136  --  137  --  136 135  K 4.1 4.5  --  3.3*  --  3.9 3.4*  CL 100* 98*  --  89*  --  90* 93*  CO2 27 31  --  38*  --  37* 34*  GLUCOSE 131* 118*  --  135*  --  142* 115*  BUN 27* 23*  --  15  --  14 21*  CREATININE 1.09* 1.14*  --  1.25*  --  1.00 1.16*  CALCIUM 6.5* 6.9*  --  8.7*  --  8.0* 7.8*  MG 1.5*  --  1.4*  --  1.7  --  2.2  PHOS  --   --  3.7  --  3.7  --  2.4*   Liver Function Tests: No results for input(s): AST, ALT, ALKPHOS, BILITOT, PROT, ALBUMIN in the last 168 hours. No results for input(s): LIPASE, AMYLASE in the last 168 hours. No  results for input(s): AMMONIA in the last 168 hours. CBC: Recent Labs  Lab 10/15/17 0344 10/16/17 0225 10/17/17 0346 10/18/17 0443 10/19/17 0518 10/20/17 0431  WBC 15.8* 11.3* 7.7 7.0 6.8 10.9*  NEUTROABS 14.4*  --   --   --   --   --   HGB 10.0* 9.1* 9.2* 8.5* 9.2* 8.2*  HCT 32.1* 29.0* 29.6* 27.7* 30.4* 26.2*  MCV 92.8 93.2 94.0 94.5 95.6 96.0  PLT 152 142* 132* 134* 173 168   Cardiac Enzymes: No results for input(s): CKTOTAL, CKMB, CKMBINDEX, TROPONINI in the last 168 hours. BNP: Invalid input(s): POCBNP CBG: Recent Labs  Lab 10/14/17 2347 10/15/17 0321 10/15/17 0752 10/15/17 1144 10/15/17 1613  GLUCAP 119* 142* 85 103* 102*   D-Dimer No results for input(s): DDIMER in the last 72 hours. Hgb A1c No results for input(s): HGBA1C in the last 72 hours. Lipid Profile No results for input(s): CHOL, HDL, LDLCALC, TRIG, CHOLHDL, LDLDIRECT in the last 72 hours. Thyroid function studies No results for input(s): TSH, T4TOTAL, T3FREE, THYROIDAB in the last 72 hours.  Invalid input(s): FREET3 Anemia work up Recent Labs    10/18/17 1530  VITAMINB12 1,055*  FOLATE 26.0  FERRITIN 141  TIBC 252  IRON 30  RETICCTPCT 3.5*   Urinalysis    Component Value Date/Time   COLORURINE YELLOW 10/10/2017 1911   APPEARANCEUR CLEAR 10/10/2017 1911   LABSPEC 1.016 10/10/2017 1911   PHURINE 5.0 10/10/2017 1911   GLUCOSEU NEGATIVE 10/10/2017 1911   HGBUR NEGATIVE 10/10/2017 1911  BILIRUBINUR NEGATIVE 10/10/2017 1911   KETONESUR 5 (A) 10/10/2017 1911   PROTEINUR 30 (A) 10/10/2017 1911   NITRITE NEGATIVE 10/10/2017 1911   LEUKOCYTESUR NEGATIVE 10/10/2017 1911   Sepsis Labs Invalid input(s): PROCALCITONIN,  WBC,  LACTICIDVEN Microbiology Recent Results (from the past 240 hour(s))  MRSA PCR Screening     Status: None   Collection Time: 10/10/17  2:02 PM  Result Value Ref Range Status   MRSA by PCR NEGATIVE NEGATIVE Final    Comment:        The GeneXpert MRSA Assay  (FDA approved for NASAL specimens only), is one component of a comprehensive MRSA colonization surveillance program. It is not intended to diagnose MRSA infection nor to guide or monitor treatment for MRSA infections.   Culture, respiratory (NON-Expectorated)     Status: None   Collection Time: 10/10/17  5:53 PM  Result Value Ref Range Status   Specimen Description TRACHEAL ASPIRATE  Final   Special Requests NONE  Final   Gram Stain   Final    ABUNDANT WBC PRESENT,BOTH PMN AND MONONUCLEAR RARE SQUAMOUS EPITHELIAL CELLS PRESENT FEW GRAM NEGATIVE RODS FEW GRAM POSITIVE COCCI IN PAIRS IN CHAINS    Culture   Final    RARE FUNGUS (MOLD) ISOLATED, PROBABLE CONTAMINANT/COLONIZER (SAPROPHYTE). CONTACT MICROBIOLOGY IF FURTHER IDENTIFICATION REQUIRED [phone].   Report Status 10/13/2017 FINAL  Final  Culture, Urine     Status: None   Collection Time: 10/10/17  6:38 PM  Result Value Ref Range Status   Specimen Description URINE, CATHETERIZED  Final   Special Requests NONE  Final   Culture NO GROWTH  Final   Report Status 10/11/2017 FINAL  Final  Culture, blood (routine x 2)     Status: None   Collection Time: 10/10/17  6:41 PM  Result Value Ref Range Status   Specimen Description BLOOD LEFT ANTECUBITAL  Final   Special Requests IN PEDIATRIC BOTTLE Blood Culture adequate volume  Final   Culture NO GROWTH 5 DAYS  Final   Report Status 10/15/2017 FINAL  Final  Culture, blood (routine x 2)     Status: None   Collection Time: 10/10/17  6:41 PM  Result Value Ref Range Status   Specimen Description BLOOD BLOOD LEFT HAND  Final   Special Requests IN PEDIATRIC BOTTLE Blood Culture adequate volume  Final   Culture NO GROWTH 5 DAYS  Final   Report Status 10/15/2017 FINAL  Final  Respiratory Panel by PCR     Status: None   Collection Time: 10/17/17  6:41 PM  Result Value Ref Range Status   Adenovirus NOT DETECTED NOT DETECTED Final   Coronavirus 229E NOT DETECTED NOT DETECTED Final    Coronavirus HKU1 NOT DETECTED NOT DETECTED Final   Coronavirus NL63 NOT DETECTED NOT DETECTED Final   Coronavirus OC43 NOT DETECTED NOT DETECTED Final   Metapneumovirus NOT DETECTED NOT DETECTED Final   Rhinovirus / Enterovirus NOT DETECTED NOT DETECTED Final   Influenza A NOT DETECTED NOT DETECTED Final   Influenza B NOT DETECTED NOT DETECTED Final   Parainfluenza Virus 1 NOT DETECTED NOT DETECTED Final   Parainfluenza Virus 2 NOT DETECTED NOT DETECTED Final   Parainfluenza Virus 3 NOT DETECTED NOT DETECTED Final   Parainfluenza Virus 4 NOT DETECTED NOT DETECTED Final   Respiratory Syncytial Virus NOT DETECTED NOT DETECTED Final   Bordetella pertussis NOT DETECTED NOT DETECTED Final   Chlamydophila pneumoniae NOT DETECTED NOT DETECTED Final   Mycoplasma pneumoniae NOT DETECTED NOT  DETECTED Final     Time coordinating discharge: Over 30 minutes  SIGNED:   Elmarie Shiley, MD  [facility] 10/20/2017, 1:45 PM Pager   If 7PM-7AM, please contact night-coverage [email or web address] Password [email or web address]

## 2017-10-20 NOTE — Progress Notes (Signed)
PT Cancellation Note  Patient Details Name: Carol Carson MRN: [id number] DOB: November 01, 1941   Cancelled Treatment:    Reason Eval/Treat Not Completed: Patient declined, no reason specified. Pt declined treatment, stating she is being d/c today and just waiting on her daughter. No d/c summery in chart. Advised pt no d/c orders currently issued and pt may be in hospital a while longer. Pt continues to decline. Will try again tomorrow if pt still present.  Benjiman Core, PTA Pager [phone] Acute Rehab   Allena Katz 10/20/2017, 1:56 PM

## 2017-10-20 NOTE — Care Management Important Message (Signed)
Important Message  Patient Details  Name: Carol Carson MRN: [id number] Date of Birth: 11/14/1941   Medicare Important Message Given:  Yes    Nathen May 10/20/2017, 11:56 AM

## 2017-10-21 DIAGNOSIS — M1990 Unspecified osteoarthritis, unspecified site: Secondary | ICD-10-CM | POA: Diagnosis not present

## 2017-10-21 DIAGNOSIS — K589 Irritable bowel syndrome without diarrhea: Secondary | ICD-10-CM | POA: Diagnosis not present

## 2017-10-21 DIAGNOSIS — Z87891 Personal history of nicotine dependence: Secondary | ICD-10-CM | POA: Diagnosis not present

## 2017-10-21 DIAGNOSIS — J449 Chronic obstructive pulmonary disease, unspecified: Secondary | ICD-10-CM | POA: Diagnosis not present

## 2017-10-21 DIAGNOSIS — J9622 Acute and chronic respiratory failure with hypercapnia: Secondary | ICD-10-CM | POA: Diagnosis not present

## 2017-10-21 DIAGNOSIS — I2781 Cor pulmonale (chronic): Secondary | ICD-10-CM | POA: Diagnosis not present

## 2017-10-21 DIAGNOSIS — I10 Essential (primary) hypertension: Secondary | ICD-10-CM | POA: Diagnosis not present

## 2017-10-21 DIAGNOSIS — Z8542 Personal history of malignant neoplasm of other parts of uterus: Secondary | ICD-10-CM | POA: Diagnosis not present

## 2017-10-21 DIAGNOSIS — R000 Tachycardia, unspecified: Secondary | ICD-10-CM | POA: Diagnosis not present

## 2017-10-21 DIAGNOSIS — G2581 Restless legs syndrome: Secondary | ICD-10-CM | POA: Diagnosis not present

## 2017-10-21 DIAGNOSIS — Z9981 Dependence on supplemental oxygen: Secondary | ICD-10-CM | POA: Diagnosis not present

## 2017-10-21 DIAGNOSIS — J9621 Acute and chronic respiratory failure with hypoxia: Secondary | ICD-10-CM | POA: Diagnosis not present

## 2017-10-22 DIAGNOSIS — J9621 Acute and chronic respiratory failure with hypoxia: Secondary | ICD-10-CM | POA: Diagnosis not present

## 2017-10-22 DIAGNOSIS — I2781 Cor pulmonale (chronic): Secondary | ICD-10-CM | POA: Diagnosis not present

## 2017-10-22 DIAGNOSIS — J449 Chronic obstructive pulmonary disease, unspecified: Secondary | ICD-10-CM | POA: Diagnosis not present

## 2017-10-22 DIAGNOSIS — J9622 Acute and chronic respiratory failure with hypercapnia: Secondary | ICD-10-CM | POA: Diagnosis not present

## 2017-10-22 DIAGNOSIS — I10 Essential (primary) hypertension: Secondary | ICD-10-CM | POA: Diagnosis not present

## 2017-10-22 DIAGNOSIS — G2581 Restless legs syndrome: Secondary | ICD-10-CM | POA: Diagnosis not present

## 2017-10-23 DIAGNOSIS — I10 Essential (primary) hypertension: Secondary | ICD-10-CM | POA: Diagnosis not present

## 2017-10-23 DIAGNOSIS — J449 Chronic obstructive pulmonary disease, unspecified: Secondary | ICD-10-CM | POA: Diagnosis not present

## 2017-10-23 DIAGNOSIS — G2581 Restless legs syndrome: Secondary | ICD-10-CM | POA: Diagnosis not present

## 2017-10-23 DIAGNOSIS — I2781 Cor pulmonale (chronic): Secondary | ICD-10-CM | POA: Diagnosis not present

## 2017-10-23 DIAGNOSIS — J9621 Acute and chronic respiratory failure with hypoxia: Secondary | ICD-10-CM | POA: Diagnosis not present

## 2017-10-23 DIAGNOSIS — J9622 Acute and chronic respiratory failure with hypercapnia: Secondary | ICD-10-CM | POA: Diagnosis not present

## 2017-10-26 DIAGNOSIS — I2781 Cor pulmonale (chronic): Secondary | ICD-10-CM | POA: Diagnosis not present

## 2017-10-26 DIAGNOSIS — J9621 Acute and chronic respiratory failure with hypoxia: Secondary | ICD-10-CM | POA: Diagnosis not present

## 2017-10-26 DIAGNOSIS — J449 Chronic obstructive pulmonary disease, unspecified: Secondary | ICD-10-CM | POA: Diagnosis not present

## 2017-10-26 DIAGNOSIS — I10 Essential (primary) hypertension: Secondary | ICD-10-CM | POA: Diagnosis not present

## 2017-10-26 DIAGNOSIS — G2581 Restless legs syndrome: Secondary | ICD-10-CM | POA: Diagnosis not present

## 2017-10-26 DIAGNOSIS — J9622 Acute and chronic respiratory failure with hypercapnia: Secondary | ICD-10-CM | POA: Diagnosis not present

## 2017-10-28 DIAGNOSIS — J9622 Acute and chronic respiratory failure with hypercapnia: Secondary | ICD-10-CM | POA: Diagnosis not present

## 2017-10-28 DIAGNOSIS — J9621 Acute and chronic respiratory failure with hypoxia: Secondary | ICD-10-CM | POA: Diagnosis not present

## 2017-10-28 DIAGNOSIS — I2781 Cor pulmonale (chronic): Secondary | ICD-10-CM | POA: Diagnosis not present

## 2017-10-28 DIAGNOSIS — G2581 Restless legs syndrome: Secondary | ICD-10-CM | POA: Diagnosis not present

## 2017-10-28 DIAGNOSIS — I10 Essential (primary) hypertension: Secondary | ICD-10-CM | POA: Diagnosis not present

## 2017-10-28 DIAGNOSIS — J449 Chronic obstructive pulmonary disease, unspecified: Secondary | ICD-10-CM | POA: Diagnosis not present

## 2017-11-02 DIAGNOSIS — I2781 Cor pulmonale (chronic): Secondary | ICD-10-CM | POA: Diagnosis not present

## 2017-11-02 DIAGNOSIS — J449 Chronic obstructive pulmonary disease, unspecified: Secondary | ICD-10-CM | POA: Diagnosis not present

## 2017-11-02 DIAGNOSIS — J9621 Acute and chronic respiratory failure with hypoxia: Secondary | ICD-10-CM | POA: Diagnosis not present

## 2017-11-02 DIAGNOSIS — I10 Essential (primary) hypertension: Secondary | ICD-10-CM | POA: Diagnosis not present

## 2017-11-02 DIAGNOSIS — J9622 Acute and chronic respiratory failure with hypercapnia: Secondary | ICD-10-CM | POA: Diagnosis not present

## 2017-11-02 DIAGNOSIS — G2581 Restless legs syndrome: Secondary | ICD-10-CM | POA: Diagnosis not present

## 2017-11-05 DIAGNOSIS — J9622 Acute and chronic respiratory failure with hypercapnia: Secondary | ICD-10-CM | POA: Diagnosis not present

## 2017-11-05 DIAGNOSIS — I2781 Cor pulmonale (chronic): Secondary | ICD-10-CM | POA: Diagnosis not present

## 2017-11-05 DIAGNOSIS — G2581 Restless legs syndrome: Secondary | ICD-10-CM | POA: Diagnosis not present

## 2017-11-05 DIAGNOSIS — J449 Chronic obstructive pulmonary disease, unspecified: Secondary | ICD-10-CM | POA: Diagnosis not present

## 2017-11-05 DIAGNOSIS — J9621 Acute and chronic respiratory failure with hypoxia: Secondary | ICD-10-CM | POA: Diagnosis not present

## 2017-11-05 DIAGNOSIS — I10 Essential (primary) hypertension: Secondary | ICD-10-CM | POA: Diagnosis not present

## 2017-11-09 DIAGNOSIS — I2781 Cor pulmonale (chronic): Secondary | ICD-10-CM | POA: Diagnosis not present

## 2017-11-09 DIAGNOSIS — J9621 Acute and chronic respiratory failure with hypoxia: Secondary | ICD-10-CM | POA: Diagnosis not present

## 2017-11-09 DIAGNOSIS — I10 Essential (primary) hypertension: Secondary | ICD-10-CM | POA: Diagnosis not present

## 2017-11-09 DIAGNOSIS — G2581 Restless legs syndrome: Secondary | ICD-10-CM | POA: Diagnosis not present

## 2017-11-09 DIAGNOSIS — J9622 Acute and chronic respiratory failure with hypercapnia: Secondary | ICD-10-CM | POA: Diagnosis not present

## 2017-11-09 DIAGNOSIS — J449 Chronic obstructive pulmonary disease, unspecified: Secondary | ICD-10-CM | POA: Diagnosis not present

## 2017-11-10 DIAGNOSIS — L89152 Pressure ulcer of sacral region, stage 2: Secondary | ICD-10-CM | POA: Diagnosis not present

## 2017-11-10 DIAGNOSIS — J449 Chronic obstructive pulmonary disease, unspecified: Secondary | ICD-10-CM | POA: Diagnosis not present

## 2017-11-10 DIAGNOSIS — E785 Hyperlipidemia, unspecified: Secondary | ICD-10-CM | POA: Diagnosis not present

## 2017-11-10 DIAGNOSIS — F411 Generalized anxiety disorder: Secondary | ICD-10-CM | POA: Diagnosis not present

## 2017-11-13 DIAGNOSIS — I2781 Cor pulmonale (chronic): Secondary | ICD-10-CM | POA: Diagnosis not present

## 2017-11-13 DIAGNOSIS — I10 Essential (primary) hypertension: Secondary | ICD-10-CM | POA: Diagnosis not present

## 2017-11-13 DIAGNOSIS — G2581 Restless legs syndrome: Secondary | ICD-10-CM | POA: Diagnosis not present

## 2017-11-13 DIAGNOSIS — J9622 Acute and chronic respiratory failure with hypercapnia: Secondary | ICD-10-CM | POA: Diagnosis not present

## 2017-11-13 DIAGNOSIS — J9621 Acute and chronic respiratory failure with hypoxia: Secondary | ICD-10-CM | POA: Diagnosis not present

## 2017-11-13 DIAGNOSIS — J449 Chronic obstructive pulmonary disease, unspecified: Secondary | ICD-10-CM | POA: Diagnosis not present

## 2017-11-17 DIAGNOSIS — I2781 Cor pulmonale (chronic): Secondary | ICD-10-CM | POA: Diagnosis not present

## 2017-11-17 DIAGNOSIS — G2581 Restless legs syndrome: Secondary | ICD-10-CM | POA: Diagnosis not present

## 2017-11-17 DIAGNOSIS — J9621 Acute and chronic respiratory failure with hypoxia: Secondary | ICD-10-CM | POA: Diagnosis not present

## 2017-11-17 DIAGNOSIS — J9622 Acute and chronic respiratory failure with hypercapnia: Secondary | ICD-10-CM | POA: Diagnosis not present

## 2017-11-17 DIAGNOSIS — J449 Chronic obstructive pulmonary disease, unspecified: Secondary | ICD-10-CM | POA: Diagnosis not present

## 2017-11-17 DIAGNOSIS — I10 Essential (primary) hypertension: Secondary | ICD-10-CM | POA: Diagnosis not present

## 2017-11-18 DIAGNOSIS — J449 Chronic obstructive pulmonary disease, unspecified: Secondary | ICD-10-CM | POA: Diagnosis not present

## 2017-11-18 DIAGNOSIS — G2581 Restless legs syndrome: Secondary | ICD-10-CM | POA: Diagnosis not present

## 2017-11-18 DIAGNOSIS — J9621 Acute and chronic respiratory failure with hypoxia: Secondary | ICD-10-CM | POA: Diagnosis not present

## 2017-11-18 DIAGNOSIS — I10 Essential (primary) hypertension: Secondary | ICD-10-CM | POA: Diagnosis not present

## 2017-11-18 DIAGNOSIS — J9622 Acute and chronic respiratory failure with hypercapnia: Secondary | ICD-10-CM | POA: Diagnosis not present

## 2017-11-18 DIAGNOSIS — I2781 Cor pulmonale (chronic): Secondary | ICD-10-CM | POA: Diagnosis not present

## 2017-11-20 DIAGNOSIS — I10 Essential (primary) hypertension: Secondary | ICD-10-CM | POA: Diagnosis not present

## 2017-11-20 DIAGNOSIS — I2781 Cor pulmonale (chronic): Secondary | ICD-10-CM | POA: Diagnosis not present

## 2017-11-20 DIAGNOSIS — J9622 Acute and chronic respiratory failure with hypercapnia: Secondary | ICD-10-CM | POA: Diagnosis not present

## 2017-11-20 DIAGNOSIS — G2581 Restless legs syndrome: Secondary | ICD-10-CM | POA: Diagnosis not present

## 2017-11-20 DIAGNOSIS — J449 Chronic obstructive pulmonary disease, unspecified: Secondary | ICD-10-CM | POA: Diagnosis not present

## 2017-11-20 DIAGNOSIS — J9621 Acute and chronic respiratory failure with hypoxia: Secondary | ICD-10-CM | POA: Diagnosis not present

## 2017-11-24 ENCOUNTER — Encounter: Payer: Self-pay | Admitting: Internal Medicine

## 2017-11-24 ENCOUNTER — Ambulatory Visit (INDEPENDENT_AMBULATORY_CARE_PROVIDER_SITE_OTHER): Payer: Medicare Other | Admitting: Internal Medicine

## 2017-11-24 VITALS — BP 120/60 | HR 90 | Ht 63.0 in | Wt 107.6 lb

## 2017-11-24 DIAGNOSIS — J449 Chronic obstructive pulmonary disease, unspecified: Secondary | ICD-10-CM

## 2017-11-24 DIAGNOSIS — R5381 Other malaise: Secondary | ICD-10-CM | POA: Diagnosis not present

## 2017-11-24 DIAGNOSIS — R911 Solitary pulmonary nodule: Secondary | ICD-10-CM

## 2017-11-24 DIAGNOSIS — J9612 Chronic respiratory failure with hypercapnia: Secondary | ICD-10-CM

## 2017-11-24 DIAGNOSIS — J9611 Chronic respiratory failure with hypoxia: Secondary | ICD-10-CM | POA: Diagnosis not present

## 2017-11-24 MED ORDER — MOMETASONE FUROATE 100 MCG/ACT IN AERO: 2.0000 | INHALATION_SPRAY | Freq: Two times a day (BID) | RESPIRATORY_TRACT | 0 refills | Status: AC

## 2017-11-24 NOTE — Patient Instructions (Addendum)
ICD-10-CM   1. Chronic respiratory failure with hypoxia and hypercapnia (HCC) J96.11    J96.12   2. COPD GOLD III/ emphysematous predominantly  J44.9   3. Physical deconditioning R53.81   4. Nodule of lower lobe of left lung R91.1    Glad you are improved from exacerbation in mid November 2018.   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  You do have a 6 mm left lower lobe lung nodule November 2018   Plan moving forward - Continue your scheduled inhaler of-  anoro daily -Add inhaled corticosteroid either Pulmicort or Asmanex or Arnuity daily to her regimen -Continue oxygen between 2 and 4 L with rest and exertion; all you need his pulse ox greater than 88% we - Discussed pulmonary rehabilitation but respect the fact that you will do exercise with physical therapy and further exercises by yourself -Initiate paperwork for nocturnal BiPAP based on carbon dioxide retention -Left lower lobe nodule discovered in November 2018; need follow-up CT chest without contrast in 7 months  Follow-up -In 3 months do spirometry and DLCO without bronchodilator response\ -Return to see Dr. Chase Caller in 3 months; CAT score at follow-up

## 2017-11-24 NOTE — Progress Notes (Signed)
Subjective:     Patient ID: Carol Carson, female   DOB: 04/27/1941, 76 y.o.   MRN: [id number]  HPI  59 yowf quit smoking 2000  with GOLD III  COPD documented 2014    History of Present Illness  08/10/2014 NP  Follow up  Returns for a 2 week follow up for COPD flare  Tx with doxycycline and steroid taper .  She is feeling much better . Breathing has returned to her baseline.  rec No change rx anoro     06/28/2015 f/u ov/Wert re:  Copd/ anoro maint rx/ xopenex rescue  Chief Complaint  Patient presents with  . Acute Visit    started w/ a tickle in the throat, now a cough w/ a yellow mucus, gets tried easily w/ a few steps begins puffing  acutely worse since 7/17 and having to use xopenex sev times a day where at baseline not needing at all but does mostly relieve here sense of sob and chest tightness but not the tickle or coughing fits rec Continue anoro each am  Only use your xopenex as a rescue medication  Doxycycline x 7 days  Prednisone 10 mg take  4 each am x 2 days,   2 each am x 2 days,  1 each am x 2 days and stop  late add check walking sats      08/09/2015 f/u ov/Wert re: copd GOLD III Chief Complaint  Patient presents with  . Follow-up    Pt states breathing is back to baseline. She states she has no new complaints at this time.   overall feels losing ground x one year and admits she's getting more and more sedendary/ has never done  Rehab  Doe MMRC 1 to 2 level Rarely using xopenex/ maint on anoro daily rec Pace yourself with exercise with goal of keeping your 02 sats at or above 90%        11/09/2015  f/u ov/Wert re: copd gold III / noct and ex desats chronically on maint anoro Chief Complaint  Patient presents with  . Follow-up    Pt c/o continued dyspnea with exertion. Pt denies cough/wheeze/CP/tightness.   rarely uses xopenex Could not do [employer] s stopping  MMRC3 = can't walk 100 yards even at a slow pace at a flat grade s stopping due to sob   rec Stop anoro and try stiolto 2 puffs each am instead  Pace yourself with walks  Late add Recheck amb 02, consider refer to rehab next ov    02/15/2016  f/u ov/Wert re:  Copd III/ noct 02   maint rx with anoro and prn xopenex / mouth too dry on stiolto Chief Complaint  Patient presents with  . Follow-up    Breathing is unchanged. She is not coughing or wheezing much. She uses xopenex 2-3 x per wk on average.   only use xopenox p unusual exertion, never before desired activities  Sleeping ok on 02 2lpm  rec Continue anoro each am  Only use your xopenex as a rescue medication prn Pulmonary f/u is as needed if not 100% satisfied  06/2016 stopped doing treamill exercise    09/15/2016 acute extended ov/Wert re:  Copd GOLD  III/ noct 02 maint anoro/prn xopnex  Chief Complaint  Patient presents with  . Acute Visit    Feeling SOB with exertion x thurs, Having troubling catching her breath, yellow sputum, some wheezing, Pt. denies chest pain, States inhaler is working  at baseline can  do HT but not [employer] on RA while maintaining on Anoro and rarely any xoponex  09/11/16 new onset  when wakes up to go to bathroom gets sob getting back to bed assoc with cough productive of yellow mucus - has not increased xoponex. rec Doxycycline 100 mg twice daily x 10 days Prednisone 10 mg take  4 each am x 2 days,   2 each am x 2 days,  1 each am x 2 days and stop  Declined amb 02     09/29/2016  f/u ov/Wert re: copd gold III/ noct 02 / anoro and rare xoponex  Chief Complaint  Patient presents with  . Follow-up    Pt states her breathing is back to her normal baseline. She has occ wheezing.    still not doing treadmill but all other activities at baseline /sleeping ok on 02  rec Try using your 02 when you do your treadmill exercise or monitor your saturations with goal of keeping it over 90%  schedule overnight 02 sat on 2lpm > desats on 2lpm so increased to 3lpm No change  In your  anoro and use   as needed xopenex Declined amb 02      10/24/2016 NP Acute OV : COPD GOLD III, on nocturnal O2.  Pt presents for an acute office visit . Says over last few months getting more sob with activity ,  Unable to less and less. For last 2 weeks Breathing has been getting worse with severe DOE. She was seen by cardiology and had cardiac stress test this morning . Stress test was low risk with nml EF . O2 sats have been running low at home.  She has had desats in past with walking into mid 80s on room air but did not want to start on o2 . Now sats are running in low 80s . On arrival today o2 sats 73% . She said they had to put her on O2 at stress test today so they could do test. She has o2 at home at home At bedtime  .  She was placed on 2 l/m of O2 with sats. >90%.  She complains of increased cough and congestion . Was treated with doxycycline and prednisone last month  rec Begin Doxcycline 100mg  Twice daily  For 7 days  Begin Prednisone taper over next week.  Check  labs today .  Begin Oxygen 2l/m during daytime  Continue oxygen 3l/m At bedtime   Mucinex DM Twice daily  As needed  Cough/congestion     11/12/2016  Extended summary f/u ov/Wert re: copd III /anoro  Chief Complaint  Patient presents with  . Follow-up    Breathing has improved with supplemental o2 use.    cough resolved p above rx/ says 02 helping ex tol but not the xopenex (note that was not the purpose of the xopenex as expressed to her previously in office and in writing. / has not resumed exercise as prev rec and refuses rehab  rec Continue anoro each am  Please see patient coordinator before you leave today  to schedule ambulatory 02 evaluation to see if you are eligible for POC> "not eligible" no alternatives per pt to her tank that only lasts an hour Only use your xopenex as a rescue medication    12/04/2016 acute extended ov/Wert re: GOLD III/ maint rx anoro and 02 2lpm 24/7 does not titrate Chief Complaint  Patient  presents with  . Acute Visit    Pt states  her breathing has been worse for the past 2 wks. She states her sats are low 60-70's with exertion on 2lpm.    despite flare of sob x 2 weeks sleeps ok / no am cough / congestion  Some stuffy nose ? Related to dry 02  xopenex has not been used prior to exertion, rarely tries it and doesn't think it helps though pred usually does  Concerned runs out of 02 p one hour on present system Still declines rehab "I know what I need to do" rec Prednisone Take 4 for two days three for two days two for two days one for two days  Please see patient coordinator before you leave today  to redo your ambulatory 02 prescription and to schedule sinus CT and I will call you with results We will contact your oxygen company to provide you with 2lpm sitting/ 4 lpm  Just while walking and 3lpm at bedtime  I strongly suggest you reconsider going to pulmonary rehab - call if you decide to do this Please schedule a follow up office visit in 4 weeks, sooner if needed  = add consider change tenormin to bisoprolol    12/30/2016  f/u ov/Wert re:  Copd III/ maint anoro  -02 2lpm, 4lpm ex/ 3 at hs  Chief Complaint  Patient presents with  . Follow-up    Breathing has improved back to her normal baseline. She has not used xopenex for rescue.   sleeps ok/ no am cough  Treadmill 8.5 min x "nl pace" x no elevation and maintains >89% on 02 up to 4 lpm / no need for saba rec Pace yourself with exercise and adjust your 02 to keep it above 90% >  Did not do  You may want to consider using bisoprolol instead of atenolol if the anoro is not working to your satisfaction  > not clear this was done at right dosing  but when stopped atenolol palpitation worse so back on it after a few days      06/30/2017  Extended f/u ov/Wert  COPD GOLD III / anoro/ 24/7 02  Chief Complaint  Patient presents with  . Follow-up    Pt c/o increased episdoes of arrythmias x 2 months. She states her  breathing seems to be gradually worsening. She states the xopenex inhaler does not work so she never uses it.   wants try new medication but extremely vague re present level of activity tolerance on present rx  Did not adjust the 02 as recommended  "you've never explained how to manage my dz"  MW  You said you were walking at a nl pace on your treadmill at last ov, no? PT No, you misunderstood me, you have a problem with communicating - is there something else we can try that won't cause my heart to skip MW:  We can change to incruse on a trial basis - it's one of the ingredients you are already on  Pt:  Will it work and does it have any side effects  MW:  Well I'm not clairvoyant but it's the best option as the other ingredient (LABA) is more assoc with palpitations and you are already on the Incruse as it's the other ingredient in Anoro which you can go back to if Incruse isn't the answer Pt:   "was that necessary to say" you weren't clairvoyant   This conversation went steadily down as I was never able to establish a baseline and she contradicted what she had  told me at previous ov and had not followed my recs on multiple occasions before so I rec she see another pulmonary specialists to see if she'll respect their recs any better than she did mine  I told her I would stand by her until she's established with another doctor although by custom/ medical board rules I'm only required to do for 30 days.    OV 07/15/2017  Chief Complaint  Patient presents with  . Follow-up    Pt changing from MW to MR. Pt states her breathing is unchanged since last OV. Pt denies significant cough and CP/tightness and f/c/s.      Gold stage III COPD patient maintained on ANORO. Transfer or CARe - MW to MR   Routine COPD follow-up but I am seeing her for the first time. She tells me overall COPD stable. She is well-controlled with single agent [employer] inhaler that uses a combination of long-acting  beta agonist and inhaled corticosteroid. She does not feel the need for any step up therapy. She says with cardiac issues she does not want to do albuterol as needed. Insert she does Xopenex which although is not as effective for her. She is generally weary of taking other long-acting beta agonists due to cardiac side effects. Overall she rates her health is stable. She's not interested in pulmonary rehabilitation because she is aware of all the exercises and performs them. She learned this from a friend with COPD and went into pulmonary rehabilitation.    OV 11/24/2017  Chief Complaint  Patient presents with  . Hospitalization Follow-up    In hosp. 11/3-11/13 due to resp. failure with hypoxia.  States that she has been SOB; cannot finish a sentence without running out of breath, still tired from just recently getting out of the hospital, and is weak.  DME: [facility]; 2L O2 continuous at rest, 3L continuous when sleeping, 4L pulsing when active.    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  In November 2018 she was supposed to make a follow-up with me with pulmonary function test.  However she got admitted mid November 2018 with COPD exacerbation associated with hypercapnia and hypoxemia.  This discovery of hypercapnia is new.  During this admission pulmonary embolism was ruled out based on review of the chart.  She did have a new finding of 6 mm left lower lobe nodule.  She was discharged home.  She indeed was intubated and then extubated.  She did not go to skilled nursing facility but went home with physical therapy at home.  She continues to get physical therapy.  She has 2 more weeks of physical therapy remaining.  She is slowly getting better but still has significant amount of fatigue.  She will need a CAT score today -and this is documented below.  She still not interested in outpatient pulmonary rehabilitation  stating that a friend of hers attended that program and has taught her every exercise and therefore she can do it on her own.  She does feel though that her single agent dual long-acting beta agonist/long-acting anticholinergic is insufficient for her.  She keeps oxygen between 2 and 4 L nasal cannula.  She is wondering about how to prevent further COPD exacerbations and admissions.  Overall slowly improving.  She was able to drive herself to the office today.   CAT COPD Symptom & Quality of Life Score ([employer] trademark) 0  is no burden. 5 is highest burden 07/15/2017  11/24/2017   Never Cough -> Cough all the time 1   No phlegm in chest -> Chest is full of phlegm 2   No chest tightness -> Chest feels very tight 0   No dyspnea for 1 flight stairs/hill -> Very dyspneic for 1 flight of stairs 4   No limitations for ADL at home -> Very limited with ADL at home 3   Confident leaving home -> Not at all confident leaving home 2   Sleep soundly -> Do not sleep soundly because of lung condition 3   Lots of Energy -> No energy at all 3   TOTAL Score (max 40)  18    Results for Carol Carson, Carol Carson (MRN [id number]) as of 11/24/2017 12:00    Ref. Range 10/10/2017 21:35 10/13/2017 04:20 10/14/2017 03:45 10/15/2017 04:03 10/18/2017 04:26  pH, Arterial Latest Ref Range: 7.350 - 7.450  7.447 7.487 (H) 7.392 7.379 7.359  pCO2 arterial Latest Ref Range: 32.0 - 48.0 mmHg 40.2 37.9 49.6 (H) 53.3 (H) 72.1 (HH)  pO2, Arterial Latest Ref Range: 83.0 - 108.0 mmHg 102.0 118 (H) 88.9 82.5 (L) 91.9     has a past medical history of Chronic renal insufficiency, COPD (chronic obstructive pulmonary disease) (HCC), Diverticulosis, DJD (degenerative joint disease), Endometrial cancer (HCC), Hyperlipidemia, Hypertension, IBS (irritable bowel syndrome), RLS (restless legs syndrome), SVT (supraventricular tachycardia) ([place]), and Uterine cancer ([place]).   reports that she quit smoking about 18 years ago. Her smoking use included  cigarettes. She has a 55.90 pack-year smoking history. she has never used smokeless tobacco.  Past Surgical History:  Procedure Laterality Date  . ABDOMINAL HYSTERECTOMY     d/t unterine cancer///no chemo/no radiation  . LUNG SURGERY  2002   golf ball sized tumor, left lung---spot vanished     Allergies  Allergen Reactions  . Contrast Media [Iodinated Diagnostic Agents] Other (See Comments)    LOW KIDNEY FUNCTION///NEED TO CHECK WITH NEPHROLOGIST BEFORE PROCEDURE.  . Biaxin [Clarithromycin] Hives  . Penicillins Hives    Has patient had a PCN reaction causing immediate rash, facial/tongue/throat swelling, SOB or lightheadedness with hypotension: Yes Has patient had a PCN reaction causing severe rash involving mucus membranes or skin necrosis: No Has patient had a PCN reaction that required hospitalization: No Has patient had a PCN reaction occurring within the last 10 years: No If all of the above answers are "NO", then may proceed with Cephalosporin use.  . Sulfa Antibiotics Rash    Immunization History  Administered Date(s) Administered  . Influenza, High Dose Seasonal PF 10/21/2013, 10/04/2015, 09/29/2016, 09/07/2017  . Influenza,inj,Quad PF,6+ Mos 08/10/2014  . Pneumococcal Conjugate-13 09/29/2016  . Pneumococcal-Unspecified 08/19/2013    Family History  Problem Relation Age of Onset  . Uterine cancer Mother   . Brain cancer Father   . Parkinson's disease Brother   . Aneurysm Sister        Brain aneusrym  . Stroke Sister   . Healthy Son   . Breast cancer Neg Hx      Current Outpatient Medications:  .  ALPRAZolam (XANAX) 0.25 MG tablet, Take 0.25 mg by mouth 2 (two) times daily as needed for anxiety or sleep. , Disp: , Rfl: 5 .  B Complex-C (SUPER B COMPLEX PO), Take 1 tablet daily by mouth., Disp: , Rfl:  .  cholecalciferol (VITAMIN D) 1000 UNITS tablet, Take 1,000 Units by mouth daily., Disp: , Rfl:  .  feeding supplement,  ENSURE ENLIVE, (ENSURE ENLIVE) LIQD, Take  237 mLs daily by mouth., Disp: 237 mL, Rfl: 12 .  ferrous sulfate 325 (65 FE) MG tablet, Take 325 mg daily by mouth., Disp: , Rfl:  .  flecainide (TAMBOCOR) 50 MG tablet, Take 1 tablet (50 mg total) by mouth 2 (two) times daily., Disp: 180 tablet, Rfl: 3 .  levalbuterol (XOPENEX HFA) 45 MCG/ACT inhaler, Inhale 2 puffs into the lungs every 4 (four) hours as needed for wheezing or shortness of breath., Disp: , Rfl:  .  metoprolol tartrate (LOPRESSOR) 50 MG tablet, Take 1 tablet (50 mg total) every 6 (six) hours by mouth., Disp: 90 tablet, Rfl: 0 .  Multiple Vitamin (MULTIVITAMIN WITH MINERALS) TABS tablet, Take 1 tablet daily by mouth. Centrum, Disp: , Rfl:  .  OXYGEN, Inhale 2-4 L See admin instructions into the lungs. 2lpm with rest, 4 with exertion and 3 lpm with sleep, Disp: , Rfl:  .  simvastatin (ZOCOR) 20 MG tablet, TAKE 1 TABLET (20 MG) BY MOUTH IN THE EVENING, Disp: , Rfl: 2 .  umeclidinium-vilanterol (ANORO ELLIPTA) 62.5-25 MCG/INH AEPB, Inhale 1 puff into the lungs daily., Disp: 90 each, Rfl: 3 .  phosphorus (K PHOS NEUTRAL) 155-852-130 MG tablet, Take 2 tablets (500 mg total) 2 (two) times daily by mouth. (Patient not taking: Reported on 11/24/2017), Disp: 6 tablet, Rfl: 0 .  rOPINIRole (REQUIP) 0.25 MG tablet, Take 1 tablet (0.25 mg total) by mouth 2 (two) times daily. (Patient not taking: Reported on 11/24/2017), Disp: 180 tablet, Rfl: 3   Review of Systems     Objective:   Physical Exam  Constitutional: She is oriented to person, place, and time. She appears well-nourished. No distress.  lean  HENT:  Head: Normocephalic and atraumatic.  Right Ear: External ear normal.  Left Ear: External ear normal.  Mouth/Throat: Oropharynx is clear and moist. No oropharyngeal exudate.  Eyes: Conjunctivae and EOM are normal. Pupils are equal, round, and reactive to light. Right eye exhibits no discharge. Left eye exhibits no discharge. No scleral icterus.  Neck: Normal range of motion. Neck  supple. No JVD present. No tracheal deviation present. No thyromegaly present.  Cardiovascular: Normal rate, regular rhythm, normal heart sounds and intact distal pulses. Exam reveals no gallop and no friction rub.  No murmur heard. Pulmonary/Chest: Effort normal and breath sounds normal. No respiratory distress. She has no wheezes. She has no rales. She exhibits no tenderness.  Mild barrrel chest Mild purse lip No wheeze  Abdominal: Soft. Bowel sounds are normal. She exhibits no distension and no mass. There is no tenderness. There is no rebound and no guarding.  Musculoskeletal: Normal range of motion. She exhibits no edema or tenderness.  Lymphadenopathy:    She has no cervical adenopathy.  Neurological: She is alert and oriented to person, place, and time. She has normal reflexes. No cranial nerve deficit. She exhibits normal muscle tone. Coordination normal.  Skin: Skin is warm and dry. No rash noted. She is not diaphoretic. No erythema. No pallor.  Psychiatric: She has a normal mood and affect. Her behavior is normal. Judgment and thought content normal.  Vitals reviewed.  Vitals:   11/24/17 1153 11/24/17 1156  BP:  120/60  Pulse:  90  SpO2: (!) 84% 92%  Weight:  107 lb 9.6 oz (48.8 kg)  Height:  5\' 3"  (1.6 m)    Estimated body mass index is 19.06 kg/m as calculated from the following:   Height as of this encounter:  5\' 3"  (1.6 m).   Weight as of this encounter: 107 lb 9.6 oz (48.8 kg).     Assessment:       ICD-10-CM   1. Chronic respiratory failure with hypoxia and hypercapnia (HCC) J96.11    J96.12   2. COPD GOLD III/ emphysematous predominantly  J44.9   3. Physical deconditioning R53.81   4. Nodule of lower lobe of left lung R91.1        Plan:      Glad you are improved from exacerbation in mid November 2018.   The fatigue that you are feeling is related to physical deconditioning following such an admission Glad you are getting physical therapy and that  component is improving You do have chronic carbon dioxide retention   You do have a 6 mm left lower lobe lung nodule November 2018 - new finding   Plan moving forward - Continue your scheduled inhaler of-  anoro daily -Add inhaled corticosteroid either Pulmicort or Asmanex or Arnuity daily to her regimen -Continue oxygen between 2 and 4 L with rest and exertion; all you need his pulse ox greater than 88% we - Discussed pulmonary rehabilitation but respect the fact that you will do exercise with physical therapy and further exercises by yourself -Initiate paperwork for nocturnal BiPAP based on carbon dioxide retention -Left lower lobe nodule discovered in November 2018; need follow-up CT chest without contrast in 7 months  Follow-up -In 3 months do spirometry and DLCO without bronchodilator response\ -Return to see Dr. Chase Caller in 3 months; CAT score at follow-up     Dr. Brand Males, M.D., [facility].C.P Pulmonary and Critical Care Medicine Staff Physician, [place] Director - Interstitial Lung Disease  Program  Pulmonary [place] at [place], [place], [address]  Pager: [phone], If no answer or between  15:00h - 7:00h: call [phone] Telephone: [phone]

## 2017-11-25 ENCOUNTER — Telehealth: Payer: Self-pay | Admitting: Internal Medicine

## 2017-11-25 NOTE — Telephone Encounter (Signed)
Relayed asmanex recs to pt.  Nothing further needed.

## 2017-11-27 DIAGNOSIS — J9621 Acute and chronic respiratory failure with hypoxia: Secondary | ICD-10-CM | POA: Diagnosis not present

## 2017-11-27 DIAGNOSIS — G2581 Restless legs syndrome: Secondary | ICD-10-CM | POA: Diagnosis not present

## 2017-11-27 DIAGNOSIS — J449 Chronic obstructive pulmonary disease, unspecified: Secondary | ICD-10-CM | POA: Diagnosis not present

## 2017-11-27 DIAGNOSIS — I2781 Cor pulmonale (chronic): Secondary | ICD-10-CM | POA: Diagnosis not present

## 2017-11-27 DIAGNOSIS — J9622 Acute and chronic respiratory failure with hypercapnia: Secondary | ICD-10-CM | POA: Diagnosis not present

## 2017-11-27 DIAGNOSIS — I10 Essential (primary) hypertension: Secondary | ICD-10-CM | POA: Diagnosis not present

## 2017-11-30 DIAGNOSIS — J9622 Acute and chronic respiratory failure with hypercapnia: Secondary | ICD-10-CM | POA: Diagnosis not present

## 2017-11-30 DIAGNOSIS — G2581 Restless legs syndrome: Secondary | ICD-10-CM | POA: Diagnosis not present

## 2017-11-30 DIAGNOSIS — J9621 Acute and chronic respiratory failure with hypoxia: Secondary | ICD-10-CM | POA: Diagnosis not present

## 2017-11-30 DIAGNOSIS — I10 Essential (primary) hypertension: Secondary | ICD-10-CM | POA: Diagnosis not present

## 2017-11-30 DIAGNOSIS — J449 Chronic obstructive pulmonary disease, unspecified: Secondary | ICD-10-CM | POA: Diagnosis not present

## 2017-11-30 DIAGNOSIS — I2781 Cor pulmonale (chronic): Secondary | ICD-10-CM | POA: Diagnosis not present

## 2017-12-02 ENCOUNTER — Telehealth: Payer: Self-pay | Admitting: Neurology

## 2017-12-02 ENCOUNTER — Telehealth: Payer: Self-pay | Admitting: Internal Medicine

## 2017-12-02 DIAGNOSIS — J449 Chronic obstructive pulmonary disease, unspecified: Secondary | ICD-10-CM | POA: Insufficient documentation

## 2017-12-02 DIAGNOSIS — K5790 Diverticulosis of intestine, part unspecified, without perforation or abscess without bleeding: Secondary | ICD-10-CM | POA: Insufficient documentation

## 2017-12-02 DIAGNOSIS — C55 Malignant neoplasm of uterus, part unspecified: Secondary | ICD-10-CM | POA: Insufficient documentation

## 2017-12-02 DIAGNOSIS — I10 Essential (primary) hypertension: Secondary | ICD-10-CM | POA: Insufficient documentation

## 2017-12-02 DIAGNOSIS — M1990 Unspecified osteoarthritis, unspecified site: Secondary | ICD-10-CM | POA: Insufficient documentation

## 2017-12-02 DIAGNOSIS — E785 Hyperlipidemia, unspecified: Secondary | ICD-10-CM | POA: Insufficient documentation

## 2017-12-02 DIAGNOSIS — C541 Malignant neoplasm of endometrium: Secondary | ICD-10-CM | POA: Insufficient documentation

## 2017-12-02 DIAGNOSIS — K589 Irritable bowel syndrome without diarrhea: Secondary | ICD-10-CM | POA: Insufficient documentation

## 2017-12-02 DIAGNOSIS — N189 Chronic kidney disease, unspecified: Secondary | ICD-10-CM | POA: Insufficient documentation

## 2017-12-02 NOTE — Telephone Encounter (Signed)
Left message for Beth to call back.

## 2017-12-02 NOTE — Telephone Encounter (Signed)
I spoke with patient and instructed her to go to ER if she thinks she is ever having a stroke.  These symptoms have been going on for about 3 weeks so she is not sure what is going on.  She said that her legs and arms get shaky.  I told her that we will put her on the first call waiting list when we have a cancellation.

## 2017-12-02 NOTE — Telephone Encounter (Signed)
Patient called needing to be seen sooner. She said her legs feel like they will give out she said she may have had a stroke, not sure. She is scheduled for February and on the wait list. Is there an opening any sooner? Please Advise. Thanks

## 2017-12-04 DIAGNOSIS — G250 Essential tremor: Secondary | ICD-10-CM | POA: Diagnosis not present

## 2017-12-04 DIAGNOSIS — R296 Repeated falls: Secondary | ICD-10-CM | POA: Diagnosis not present

## 2017-12-04 DIAGNOSIS — R29898 Other symptoms and signs involving the musculoskeletal system: Secondary | ICD-10-CM | POA: Diagnosis not present

## 2017-12-05 ENCOUNTER — Emergency Department (HOSPITAL_COMMUNITY): Payer: Medicare Other

## 2017-12-05 ENCOUNTER — Inpatient Hospital Stay (HOSPITAL_COMMUNITY)
Admission: EM | Admit: 2017-12-05 | Discharge: 2018-01-08 | DRG: 480 | Disposition: E | Payer: Medicare Other | Attending: Emergency Medicine | Admitting: Emergency Medicine

## 2017-12-05 ENCOUNTER — Other Ambulatory Visit: Payer: Self-pay

## 2017-12-05 ENCOUNTER — Encounter (HOSPITAL_COMMUNITY): Payer: Self-pay | Admitting: Emergency Medicine

## 2017-12-05 DIAGNOSIS — J9621 Acute and chronic respiratory failure with hypoxia: Secondary | ICD-10-CM | POA: Diagnosis not present

## 2017-12-05 DIAGNOSIS — E871 Hypo-osmolality and hyponatremia: Secondary | ICD-10-CM | POA: Diagnosis not present

## 2017-12-05 DIAGNOSIS — E440 Moderate protein-calorie malnutrition: Secondary | ICD-10-CM

## 2017-12-05 DIAGNOSIS — S72009A Fracture of unspecified part of neck of unspecified femur, initial encounter for closed fracture: Secondary | ICD-10-CM | POA: Diagnosis not present

## 2017-12-05 DIAGNOSIS — Z515 Encounter for palliative care: Secondary | ICD-10-CM | POA: Diagnosis not present

## 2017-12-05 DIAGNOSIS — X58XXXA Exposure to other specified factors, initial encounter: Secondary | ICD-10-CM | POA: Diagnosis not present

## 2017-12-05 DIAGNOSIS — E785 Hyperlipidemia, unspecified: Secondary | ICD-10-CM | POA: Diagnosis present

## 2017-12-05 DIAGNOSIS — Z881 Allergy status to other antibiotic agents status: Secondary | ICD-10-CM

## 2017-12-05 DIAGNOSIS — J449 Chronic obstructive pulmonary disease, unspecified: Secondary | ICD-10-CM | POA: Diagnosis present

## 2017-12-05 DIAGNOSIS — I471 Supraventricular tachycardia, unspecified: Secondary | ICD-10-CM | POA: Diagnosis present

## 2017-12-05 DIAGNOSIS — D62 Acute posthemorrhagic anemia: Secondary | ICD-10-CM | POA: Diagnosis not present

## 2017-12-05 DIAGNOSIS — Z66 Do not resuscitate: Secondary | ICD-10-CM | POA: Diagnosis present

## 2017-12-05 DIAGNOSIS — J9601 Acute respiratory failure with hypoxia: Secondary | ICD-10-CM | POA: Diagnosis not present

## 2017-12-05 DIAGNOSIS — W010XXA Fall on same level from slipping, tripping and stumbling without subsequent striking against object, initial encounter: Secondary | ICD-10-CM | POA: Diagnosis present

## 2017-12-05 DIAGNOSIS — J9622 Acute and chronic respiratory failure with hypercapnia: Secondary | ICD-10-CM | POA: Diagnosis not present

## 2017-12-05 DIAGNOSIS — T797XXA Traumatic subcutaneous emphysema, initial encounter: Secondary | ICD-10-CM | POA: Diagnosis not present

## 2017-12-05 DIAGNOSIS — I10 Essential (primary) hypertension: Secondary | ICD-10-CM

## 2017-12-05 DIAGNOSIS — I129 Hypertensive chronic kidney disease with stage 1 through stage 4 chronic kidney disease, or unspecified chronic kidney disease: Secondary | ICD-10-CM | POA: Diagnosis present

## 2017-12-05 DIAGNOSIS — N179 Acute kidney failure, unspecified: Secondary | ICD-10-CM | POA: Diagnosis not present

## 2017-12-05 DIAGNOSIS — R570 Cardiogenic shock: Secondary | ICD-10-CM | POA: Diagnosis not present

## 2017-12-05 DIAGNOSIS — I700 Atherosclerosis of aorta: Secondary | ICD-10-CM | POA: Diagnosis not present

## 2017-12-05 DIAGNOSIS — M25551 Pain in right hip: Secondary | ICD-10-CM | POA: Diagnosis not present

## 2017-12-05 DIAGNOSIS — G931 Anoxic brain damage, not elsewhere classified: Secondary | ICD-10-CM | POA: Diagnosis not present

## 2017-12-05 DIAGNOSIS — Z87891 Personal history of nicotine dependence: Secondary | ICD-10-CM

## 2017-12-05 DIAGNOSIS — Z452 Encounter for adjustment and management of vascular access device: Secondary | ICD-10-CM | POA: Diagnosis not present

## 2017-12-05 DIAGNOSIS — S72011A Unspecified intracapsular fracture of right femur, initial encounter for closed fracture: Secondary | ICD-10-CM | POA: Diagnosis not present

## 2017-12-05 DIAGNOSIS — N183 Chronic kidney disease, stage 3 unspecified: Secondary | ICD-10-CM | POA: Insufficient documentation

## 2017-12-05 DIAGNOSIS — M62838 Other muscle spasm: Secondary | ICD-10-CM | POA: Diagnosis present

## 2017-12-05 DIAGNOSIS — I469 Cardiac arrest, cause unspecified: Secondary | ICD-10-CM | POA: Diagnosis not present

## 2017-12-05 DIAGNOSIS — Z79899 Other long term (current) drug therapy: Secondary | ICD-10-CM

## 2017-12-05 DIAGNOSIS — Z681 Body mass index (BMI) 19 or less, adult: Secondary | ICD-10-CM | POA: Diagnosis not present

## 2017-12-05 DIAGNOSIS — K589 Irritable bowel syndrome without diarrhea: Secondary | ICD-10-CM | POA: Diagnosis present

## 2017-12-05 DIAGNOSIS — R34 Anuria and oliguria: Secondary | ICD-10-CM | POA: Diagnosis not present

## 2017-12-05 DIAGNOSIS — R402434 Glasgow coma scale score 3-8, 24 hours or more after hospital admission: Secondary | ICD-10-CM | POA: Diagnosis not present

## 2017-12-05 DIAGNOSIS — S72141A Displaced intertrochanteric fracture of right femur, initial encounter for closed fracture: Principal | ICD-10-CM

## 2017-12-05 DIAGNOSIS — R0600 Dyspnea, unspecified: Secondary | ICD-10-CM

## 2017-12-05 DIAGNOSIS — J9611 Chronic respiratory failure with hypoxia: Secondary | ICD-10-CM

## 2017-12-05 DIAGNOSIS — E872 Acidosis: Secondary | ICD-10-CM | POA: Diagnosis not present

## 2017-12-05 DIAGNOSIS — J441 Chronic obstructive pulmonary disease with (acute) exacerbation: Secondary | ICD-10-CM | POA: Diagnosis not present

## 2017-12-05 DIAGNOSIS — R0902 Hypoxemia: Secondary | ICD-10-CM | POA: Diagnosis not present

## 2017-12-05 DIAGNOSIS — J690 Pneumonitis due to inhalation of food and vomit: Secondary | ICD-10-CM | POA: Diagnosis not present

## 2017-12-05 DIAGNOSIS — Z880 Allergy status to penicillin: Secondary | ICD-10-CM

## 2017-12-05 DIAGNOSIS — I6782 Cerebral ischemia: Secondary | ICD-10-CM | POA: Diagnosis not present

## 2017-12-05 DIAGNOSIS — R05 Cough: Secondary | ICD-10-CM | POA: Diagnosis not present

## 2017-12-05 DIAGNOSIS — Z8049 Family history of malignant neoplasm of other genital organs: Secondary | ICD-10-CM

## 2017-12-05 DIAGNOSIS — Z8542 Personal history of malignant neoplasm of other parts of uterus: Secondary | ICD-10-CM

## 2017-12-05 DIAGNOSIS — I468 Cardiac arrest due to other underlying condition: Secondary | ICD-10-CM | POA: Diagnosis not present

## 2017-12-05 DIAGNOSIS — F418 Other specified anxiety disorders: Secondary | ICD-10-CM | POA: Insufficient documentation

## 2017-12-05 DIAGNOSIS — S2231XA Fracture of one rib, right side, initial encounter for closed fracture: Secondary | ICD-10-CM | POA: Diagnosis not present

## 2017-12-05 DIAGNOSIS — S72001A Fracture of unspecified part of neck of right femur, initial encounter for closed fracture: Secondary | ICD-10-CM | POA: Diagnosis not present

## 2017-12-05 DIAGNOSIS — J9690 Respiratory failure, unspecified, unspecified whether with hypoxia or hypercapnia: Secondary | ICD-10-CM | POA: Diagnosis not present

## 2017-12-05 DIAGNOSIS — T148XXA Other injury of unspecified body region, initial encounter: Secondary | ICD-10-CM | POA: Diagnosis not present

## 2017-12-05 DIAGNOSIS — Z4682 Encounter for fitting and adjustment of non-vascular catheter: Secondary | ICD-10-CM | POA: Diagnosis not present

## 2017-12-05 DIAGNOSIS — W19XXXA Unspecified fall, initial encounter: Secondary | ICD-10-CM | POA: Diagnosis not present

## 2017-12-05 DIAGNOSIS — S72041A Displaced fracture of base of neck of right femur, initial encounter for closed fracture: Secondary | ICD-10-CM | POA: Diagnosis not present

## 2017-12-05 DIAGNOSIS — G2581 Restless legs syndrome: Secondary | ICD-10-CM | POA: Diagnosis present

## 2017-12-05 DIAGNOSIS — Z90710 Acquired absence of both cervix and uterus: Secondary | ICD-10-CM

## 2017-12-05 DIAGNOSIS — Z9981 Dependence on supplemental oxygen: Secondary | ICD-10-CM

## 2017-12-05 DIAGNOSIS — Z91041 Radiographic dye allergy status: Secondary | ICD-10-CM

## 2017-12-05 DIAGNOSIS — Z419 Encounter for procedure for purposes other than remedying health state, unspecified: Secondary | ICD-10-CM

## 2017-12-05 DIAGNOSIS — D638 Anemia in other chronic diseases classified elsewhere: Secondary | ICD-10-CM | POA: Diagnosis present

## 2017-12-05 DIAGNOSIS — J439 Emphysema, unspecified: Secondary | ICD-10-CM | POA: Diagnosis not present

## 2017-12-05 HISTORY — DX: Chronic respiratory failure with hypoxia: J96.11

## 2017-12-05 LAB — BASIC METABOLIC PANEL
ANION GAP: 13 (ref 5–15)
BUN: 18 mg/dL (ref 6–20)
CO2: 33 mmol/L — AB (ref 22–32)
Calcium: 8.6 mg/dL — ABNORMAL LOW (ref 8.9–10.3)
Chloride: 81 mmol/L — ABNORMAL LOW (ref 101–111)
Creatinine, Ser: 1.6 mg/dL — ABNORMAL HIGH (ref 0.44–1.00)
GFR calc Af Amer: 35 mL/min — ABNORMAL LOW (ref >60)
GFR calc non Af Amer: 30 mL/min — ABNORMAL LOW (ref >60)
GLUCOSE: 105 mg/dL — AB (ref 65–99)
POTASSIUM: 4.1 mmol/L (ref 3.5–5.1)
Sodium: 127 mmol/L — ABNORMAL LOW (ref 135–145)

## 2017-12-05 LAB — TYPE AND SCREEN
ABO/RH(D): O POS
Antibody Screen: NEGATIVE

## 2017-12-05 LAB — CBC WITH DIFFERENTIAL/PLATELET
BASOS PCT: 0 %
Basophils Absolute: 0 10*3/uL (ref 0.0–0.1)
Eosinophils Absolute: 0.2 10*3/uL (ref 0.0–0.7)
Eosinophils Relative: 2 %
HEMATOCRIT: 29 % — AB (ref 36.0–46.0)
HEMOGLOBIN: 9.2 g/dL — AB (ref 12.0–15.0)
LYMPHS ABS: 1.1 10*3/uL (ref 0.7–4.0)
Lymphocytes Relative: 11 %
MCH: 28.1 pg (ref 26.0–34.0)
MCHC: 31.7 g/dL (ref 30.0–36.0)
MCV: 88.7 fL (ref 78.0–100.0)
MONOS PCT: 8 %
Monocytes Absolute: 0.9 10*3/uL (ref 0.1–1.0)
NEUTROS ABS: 8.4 10*3/uL — AB (ref 1.7–7.7)
NEUTROS PCT: 79 %
Platelets: 266 10*3/uL (ref 150–400)
RBC: 3.27 MIL/uL — ABNORMAL LOW (ref 3.87–5.11)
RDW: 14.5 % (ref 11.5–15.5)
WBC: 10.7 10*3/uL — ABNORMAL HIGH (ref 4.0–10.5)

## 2017-12-05 LAB — APTT: APTT: 30 s (ref 24–36)

## 2017-12-05 LAB — OSMOLALITY: OSMOLALITY: 274 mosm/kg — AB (ref 275–295)

## 2017-12-05 LAB — ABO/RH: ABO/RH(D): O POS

## 2017-12-05 LAB — PROTIME-INR
INR: 1.05
Prothrombin Time: 13.6 seconds (ref 11.4–15.2)

## 2017-12-05 MED ORDER — ALPRAZOLAM 0.25 MG PO TABS
0.2500 mg | ORAL_TABLET | Freq: Two times a day (BID) | ORAL | Status: DC | PRN
Start: 2017-12-05 — End: 2017-12-09
  Administered 2017-12-05 – 2017-12-07 (×2): 0.25 mg via ORAL
  Filled 2017-12-05 (×2): qty 1

## 2017-12-05 MED ORDER — SIMVASTATIN 20 MG PO TABS
20.0000 mg | ORAL_TABLET | Freq: Every day | ORAL | Status: DC
Start: 2017-12-06 — End: 2017-12-09
  Administered 2017-12-08: 20 mg via ORAL
  Filled 2017-12-05 (×2): qty 1

## 2017-12-05 MED ORDER — VITAMIN D 1000 UNITS PO TABS
1000.0000 [IU] | ORAL_TABLET | Freq: Every day | ORAL | Status: DC
  Administered 2017-12-07 – 2017-12-11 (×4): 1000 [IU] via ORAL
  Filled 2017-12-05 (×4): qty 1

## 2017-12-05 MED ORDER — LEVALBUTEROL HCL 1.25 MG/0.5ML IN NEBU
1.2500 mg | INHALATION_SOLUTION | Freq: Three times a day (TID) | RESPIRATORY_TRACT | Status: DC
  Administered 2017-12-06 – 2017-12-07 (×3): 1.25 mg via RESPIRATORY_TRACT
  Filled 2017-12-05 (×4): qty 0.5

## 2017-12-05 MED ORDER — MORPHINE SULFATE (PF) 4 MG/ML IV SOLN
4.0000 mg | INTRAVENOUS | Status: DC | PRN
  Administered 2017-12-05: 4 mg via INTRAVENOUS
  Filled 2017-12-05: qty 1

## 2017-12-05 MED ORDER — ONDANSETRON HCL 4 MG/2ML IJ SOLN: 4.0000 mg | Freq: Three times a day (TID) | INTRAMUSCULAR | Status: DC | PRN

## 2017-12-05 MED ORDER — FLECAINIDE ACETATE 50 MG PO TABS
50.0000 mg | ORAL_TABLET | Freq: Two times a day (BID) | ORAL | Status: DC
  Administered 2017-12-06 – 2017-12-10 (×7): 50 mg via ORAL
  Filled 2017-12-05 (×10): qty 1

## 2017-12-05 MED ORDER — METOPROLOL TARTRATE 50 MG PO TABS
50.0000 mg | ORAL_TABLET | Freq: Four times a day (QID) | ORAL | Status: DC
  Administered 2017-12-05 – 2017-12-08 (×6): 50 mg via ORAL
  Filled 2017-12-05 (×5): qty 1
  Filled 2017-12-05: qty 2
  Filled 2017-12-05 (×2): qty 1

## 2017-12-05 MED ORDER — ACETAMINOPHEN 325 MG PO TABS: 325.0000 mg | ORAL_TABLET | ORAL | Status: DC | PRN

## 2017-12-05 MED ORDER — ADULT MULTIVITAMIN W/MINERALS CH
1.0000 | ORAL_TABLET | Freq: Every day | ORAL | Status: DC
  Administered 2017-12-07 – 2017-12-08 (×2): 1 via ORAL
  Filled 2017-12-05 (×2): qty 1

## 2017-12-05 MED ORDER — ZOLPIDEM TARTRATE 5 MG PO TABS: 5.0000 mg | ORAL_TABLET | Freq: Every evening | ORAL | Status: DC | PRN

## 2017-12-05 MED ORDER — UMECLIDINIUM-VILANTEROL 62.5-25 MCG/INH IN AEPB
1.0000 | INHALATION_SPRAY | Freq: Every day | RESPIRATORY_TRACT | Status: DC
  Administered 2017-12-06 – 2017-12-08 (×2): 1 via RESPIRATORY_TRACT
  Filled 2017-12-05 (×2): qty 14

## 2017-12-05 MED ORDER — OXYCODONE-ACETAMINOPHEN 5-325 MG PO TABS
1.0000 | ORAL_TABLET | ORAL | Status: DC | PRN
  Administered 2017-12-05 – 2017-12-06 (×2): 1 via ORAL
  Filled 2017-12-05 (×2): qty 1

## 2017-12-05 MED ORDER — FENTANYL CITRATE (PF) 100 MCG/2ML IJ SOLN
50.0000 ug | Freq: Once | INTRAMUSCULAR | Status: AC
  Administered 2017-12-05: 50 ug via INTRAVENOUS
  Filled 2017-12-05: qty 2

## 2017-12-05 MED ORDER — MORPHINE SULFATE (PF) 4 MG/ML IV SOLN
0.5000 mg | INTRAVENOUS | Status: AC | PRN
  Administered 2017-12-05: 0.52 mg via INTRAVENOUS
  Filled 2017-12-05: qty 1

## 2017-12-05 MED ORDER — METHOCARBAMOL 500 MG PO TABS
500.0000 mg | ORAL_TABLET | Freq: Three times a day (TID) | ORAL | Status: DC | PRN
  Administered 2017-12-05: 500 mg via ORAL
  Filled 2017-12-05: qty 1

## 2017-12-05 MED ORDER — LEVALBUTEROL HCL 1.25 MG/0.5ML IN NEBU
1.2500 mg | INHALATION_SOLUTION | Freq: Four times a day (QID) | RESPIRATORY_TRACT | Status: DC
Start: 2017-12-05 — End: 2017-12-05
  Filled 2017-12-05 (×3): qty 0.5

## 2017-12-05 MED ORDER — ENSURE ENLIVE PO LIQD
237.0000 mL | ORAL | Status: DC
  Administered 2017-12-07 – 2017-12-08 (×2): 237 mL via ORAL

## 2017-12-05 MED ORDER — HYDRALAZINE HCL 20 MG/ML IJ SOLN: 5.0000 mg | INTRAMUSCULAR | Status: DC | PRN

## 2017-12-05 MED ORDER — BUDESONIDE 0.25 MG/2ML IN SUSP
0.2500 mg | Freq: Two times a day (BID) | RESPIRATORY_TRACT | Status: DC
  Administered 2017-12-06 – 2017-12-08 (×6): 0.25 mg via RESPIRATORY_TRACT
  Filled 2017-12-05 (×6): qty 2

## 2017-12-05 MED ORDER — FERROUS SULFATE 325 (65 FE) MG PO TABS: 325.0000 mg | ORAL_TABLET | Freq: Every day | ORAL | Status: DC | PRN

## 2017-12-05 MED ORDER — DM-GUAIFENESIN ER 30-600 MG PO TB12
1.0000 | ORAL_TABLET | Freq: Two times a day (BID) | ORAL | Status: DC | PRN
  Filled 2017-12-05: qty 1

## 2017-12-05 MED ORDER — SENNOSIDES-DOCUSATE SODIUM 8.6-50 MG PO TABS: 1.0000 | ORAL_TABLET | Freq: Every evening | ORAL | Status: DC | PRN

## 2017-12-05 MED ORDER — SODIUM CHLORIDE 0.9 % IV SOLN
INTRAVENOUS | Status: DC
  Administered 2017-12-05 – 2017-12-08 (×3): via INTRAVENOUS

## 2017-12-05 NOTE — ED Notes (Signed)
Re-paged Oncall Ortho Alain Marion)

## 2017-12-05 NOTE — ED Notes (Signed)
Patient transported to X-ray 

## 2017-12-05 NOTE — ED Notes (Signed)
Purewick placed on pt. 

## 2017-12-05 NOTE — ED Notes (Signed)
Left Percell Miller a voicemail regarding consult for Dr. Tamera Punt

## 2017-12-05 NOTE — Progress Notes (Signed)
Orthopedic Tech Progress Note Patient Details:  Carol Carson [birth year] [id number]  Patient ID: [name], female   DOB: May 03, 1941, 76 y.o.   MRN: [id number] Pt cant have ohf due to age restrictions  Karolee Stamps 12/02/2017, 9:20 PM

## 2017-12-05 NOTE — H&P (Signed)
History and Physical    Carol Carson MWN:[id number] DOB: [birth year] DOA: 11/25/2017  Referring MD/NP/PA:   PCP: Leeroy Cha, MD   Patient coming from:  The patient is coming from home.  At baseline, pt is independent for most of ADL.   Chief Complaint: fall and right hip pain  HPI: Carol Carson is a 76 y.o. female with medical history significant of hypertension, hyperlipidemia, COPD on 2 L oxygen at home, depression with anxiety, remote uterine cancer in remission, SVT, CKD-3, iron deficiency anemia, IBS, who presents with fall and right hip pain.  She states she was trying to get her slippers on on the hardwood floor and slipped, and fell over onto her right side. She developed right hip pain, which is constant, 8 out of 10 in severity, sharp, nonradiating. No leg numbness or weakness. Patient strongly denies any head and neck injury. No headache or neck pain. No LOC. Patient does not have any chest pain, shortness breath, cough, fever or chills. No GI symptoms or symptoms of UTI.  ED Course: pt was found to have WBC 10.7, sodium 127, worsening renal function, temperature normal, no tachycardia, no tachypnea, oxygen saturation 93% on 2 L oxygen. X-ray showed displaced right femoral neck fracture. Patient is admitted to telemetry bed as inpatient. Orthopedic surgeon, Dr. Percell Miller was consulted.  Review of Systems:   General: no fevers, chills, no body weight gain, has fatigue HEENT: no blurry vision, hearing changes or sore throat Respiratory: no dyspnea, coughing, wheezing CV: no chest pain, no palpitations GI: no nausea, vomiting, abdominal pain, diarrhea, constipation GU: no dysuria, burning on urination, increased urinary frequency, hematuria  Ext: no leg edema Neuro: no unilateral weakness, numbness, or tingling, no vision change or hearing loss Skin: no rash, no skin tear. MSK: has right hip pain Heme: No easy bruising.  Travel history: No recent long distant  travel.  Allergy:  Allergies  Allergen Reactions  . Contrast Media [Iodinated Diagnostic Agents] Other (See Comments)    LOW KIDNEY FUNCTION///NEED TO CHECK WITH NEPHROLOGIST BEFORE PROCEDURE.  . Biaxin [Clarithromycin] Hives  . Penicillins Hives    Has patient had a PCN reaction causing immediate rash, facial/tongue/throat swelling, SOB or lightheadedness with hypotension: Yes Has patient had a PCN reaction causing severe rash involving mucus membranes or skin necrosis: No Has patient had a PCN reaction that required hospitalization: No Has patient had a PCN reaction occurring within the last 10 years: No If all of the above answers are "NO", then may proceed with Cephalosporin use.  . Sulfa Antibiotics Rash    Past Medical History:  Diagnosis Date  . Chronic renal insufficiency   . COPD (chronic obstructive pulmonary disease) ([place])   . Diverticulosis   . DJD (degenerative joint disease)   . Endometrial cancer ([place])   . Hyperlipidemia   . Hypertension   . IBS (irritable bowel syndrome)   . RLS (restless legs syndrome)   . SVT (supraventricular tachycardia) ([place])   . Uterine cancer [facility])     Past Surgical History:  Procedure Laterality Date  . ABDOMINAL HYSTERECTOMY     d/t unterine cancer///no chemo/no radiation  . LUNG SURGERY  2002   golf ball sized tumor, left lung---spot vanished     Social History:  reports that she quit smoking about 19 years ago. Her smoking use included cigarettes. She has a 55.90 pack-year smoking history. she has never used smokeless tobacco. She reports that she does not drink alcohol or use drugs.  Family History:  Family History  Problem Relation Age of Onset  . Uterine cancer Mother   . Brain cancer Father   . Parkinson's disease Brother   . Aneurysm Sister        Brain aneusrym  . Stroke Sister   . Healthy Son   . Breast cancer Neg Hx      Prior to Admission medications   Medication Sig Start Date End Date Taking? Authorizing  Provider  ALPRAZolam (XANAX) 0.25 MG tablet Take 0.25 mg by mouth 2 (two) times daily as needed for anxiety or sleep.  02/08/15  Yes [provider]  B Complex-C (SUPER B COMPLEX PO) Take 1 tablet daily by mouth.   Yes [provider]  cholecalciferol (VITAMIN D) 1000 UNITS tablet Take 1,000 Units by mouth daily.   Yes [provider]  feeding supplement, ENSURE ENLIVE, (ENSURE ENLIVE) LIQD Take 237 mLs daily by mouth. 10/20/17  Yes Regalado, Belkys A, MD  ferrous sulfate 325 (65 FE) MG tablet Take 325 mg by mouth daily as needed (for iron).    Yes [provider]  flecainide (TAMBOCOR) 50 MG tablet Take 1 tablet (50 mg total) by mouth 2 (two) times daily. 07/29/17  Yes Camnitz, Will Hassell Done, MD  levalbuterol [facility] HFA) 45 MCG/ACT inhaler Inhale 2 puffs into the lungs every 4 (four) hours as needed for wheezing or shortness of breath.   Yes [provider]  metoprolol tartrate (LOPRESSOR) 50 MG tablet Take 1 tablet (50 mg total) every 6 (six) hours by mouth. 10/20/17  Yes Regalado, Belkys A, MD  Mometasone Furoate (ASMANEX HFA) 100 MCG/ACT AERO Inhale 2 puffs into the lungs 2 (two) times daily. 11/24/17  Yes Brand Males, MD  Multiple Vitamin (MULTIVITAMIN WITH MINERALS) TABS tablet Take 1 tablet daily by mouth. Centrum   Yes [provider]  simvastatin (ZOCOR) 20 MG tablet TAKE 1 TABLET (20 MG) BY MOUTH IN THE EVENING 07/31/17  Yes [provider]  umeclidinium-vilanterol (ANORO ELLIPTA) 62.5-25 MCG/INH AEPB Inhale 1 puff into the lungs daily. 07/17/17  Yes Brand Males, MD  OXYGEN Inhale 2-4 L See admin instructions into the lungs. 2lpm with rest, 4 with exertion and 3 lpm with sleep    [provider]  phosphorus (K PHOS NEUTRAL) 155-852-130 MG tablet Take 2 tablets (500 mg total) 2 (two) times daily by mouth. Patient not taking: Reported on 11/24/2017 10/20/17   Regalado, Jerald Kief A, MD  rOPINIRole (REQUIP) 0.25 MG  tablet Take 1 tablet (0.25 mg total) by mouth 2 (two) times daily. Patient not taking: Reported on 11/24/2017 04/14/17   Alda Berthold, DO    Physical Exam: Vitals:   11/18/2017 1930 12/02/2017 2000 12/02/2017 2045 11/10/2017 2100  BP: (!) 135/58 131/60 (!) 158/61 128/66  Pulse: 81 84 87 96  Resp:      Temp:      TempSrc:      SpO2: 93% 94% 92% 92%   General: Not in acute distress HEENT:       Eyes: PERRL, EOMI, no scleral icterus.       ENT: No discharge from the ears and nose, no pharynx injection, no tonsillar enlargement.        Neck: No JVD, no bruit, no mass felt. Heme: No neck lymph node enlargement. Cardiac: S1/S2, RRR, No murmurs, No gallops or rubs. Respiratory: No rales, wheezing, rhonchi or rubs. GI: Soft, nondistended, nontender, no rebound pain, no organomegaly, BS present. GU: No hematuria Ext: No  pitting leg edema bilaterally. 2+DP/PT pulse bilaterally. Musculoskeletal: has right hip tenderness. Skin: No rashes.  Neuro: Alert, oriented X3, cranial nerves II-XII grossly intact, moves all extremities. Psych: Patient is not psychotic, no suicidal or hemocidal ideation.  Labs on Admission: I have personally reviewed following labs and imaging studies  CBC: Recent Labs  Lab 11/11/2017 2001  WBC 10.7*  NEUTROABS 8.4*  HGB 9.2*  HCT 29.0*  MCV 88.7  PLT 784   Basic Metabolic Panel: Recent Labs  Lab 12/02/2017 2001  NA 127*  K 4.1  CL 81*  CO2 33*  GLUCOSE 105*  BUN 18  CREATININE 1.60*  CALCIUM 8.6*   GFR: Estimated Creatinine Clearance: 22.7 mL/min (A) (by C-G formula based on SCr of 1.6 mg/dL (H)). Liver Function Tests: No results for input(s): AST, ALT, ALKPHOS, BILITOT, PROT, ALBUMIN in the last 168 hours. No results for input(s): LIPASE, AMYLASE in the last 168 hours. No results for input(s): AMMONIA in the last 168 hours. Coagulation Profile: Recent Labs  Lab 11/23/2017 2001  INR 1.05   Cardiac Enzymes: No results for input(s): CKTOTAL, CKMB,  CKMBINDEX, TROPONINI in the last 168 hours. BNP (last 3 results) No results for input(s): PROBNP in the last 8760 hours. HbA1C: No results for input(s): HGBA1C in the last 72 hours. CBG: No results for input(s): GLUCAP in the last 168 hours. Lipid Profile: No results for input(s): CHOL, HDL, LDLCALC, TRIG, CHOLHDL, LDLDIRECT in the last 72 hours. Thyroid Function Tests: No results for input(s): TSH, T4TOTAL, FREET4, T3FREE, THYROIDAB in the last 72 hours. Anemia Panel: No results for input(s): VITAMINB12, FOLATE, FERRITIN, TIBC, IRON, RETICCTPCT in the last 72 hours. Urine analysis:    Component Value Date/Time   COLORURINE YELLOW 10/10/2017 1911   APPEARANCEUR CLEAR 10/10/2017 1911   LABSPEC 1.016 10/10/2017 1911   PHURINE 5.0 10/10/2017 1911   GLUCOSEU NEGATIVE 10/10/2017 1911   HGBUR NEGATIVE 10/10/2017 1911   BILIRUBINUR NEGATIVE 10/10/2017 1911   KETONESUR 5 (A) 10/10/2017 1911   PROTEINUR 30 (A) 10/10/2017 1911   NITRITE NEGATIVE 10/10/2017 1911   LEUKOCYTESUR NEGATIVE 10/10/2017 1911   Sepsis Labs: @LABRCNTIP (procalcitonin:4,lacticidven:4) )No results found for this or any previous visit (from the past 240 hour(s)).   Radiological Exams on Admission: Dg Chest 1 View  Result Date: 11/20/2017 CLINICAL DATA:  Right hip fracture EXAM: CHEST 1 VIEW COMPARISON:  10/15/2017 chest radiograph. FINDINGS: Decubitus views were obtained. Stable cardiomediastinal silhouette with normal heart size and aortic atherosclerosis. No pneumothorax. No pleural effusion. Peribronchial cuffing throughout both lungs is unchanged. No acute consolidative airspace disease or pulmonary edema. Relative hypoinflation of the left lung is probably due to technique. IMPRESSION: No acute cardiopulmonary disease. Stable chronic diffuse peribronchial cuffing suggesting chronic bronchitis. Electronically Signed   By: Ilona Sorrel M.D.   On: 12/02/2017 18:26   Ct Hip Right Wo Contrast  Result Date:  11/18/2017 CLINICAL DATA:  [place].  Right hip fracture. EXAM: CT OF THE RIGHT HIP WITHOUT CONTRAST TECHNIQUE: Multidetector CT imaging of the right hip was performed according to the standard protocol. Multiplanar CT image reconstructions were also generated. COMPARISON:  Radiographs 11/24/2017 FINDINGS: There is a mildly displaced intertrochanteric fracture of the right hip with a varus deformity and mild impaction. No femoral neck fracture is identified. Both hips are normally located. Moderate degenerative changes bilaterally. Remote healed left pubic rami fractures. No new/ acute right-sided pelvic fractures. No significant intrapelvic abnormalities. IMPRESSION: Mildly displaced intertrochanteric fracture of the right hip. Electronically Signed  By: Marijo Sanes M.D.   On: 11/22/2017 20:47   Dg Hip Unilat W Or Wo Pelvis 2-3 Views Right  Result Date: 11/22/2017 CLINICAL DATA:  76 year old female with fall and right hip pain. EXAM: DG HIP (WITH OR WITHOUT PELVIS) 2-3V RIGHT COMPARISON:  None. FINDINGS: There is a minimally displaced fracture of the right femoral neck with intertrochanteric extension. There is varus angulation of the femoral shaft. There is no dislocation. The bones are osteopenic. There is no dislocation. There is degenerative changes of the lower lumbar spine. The soft tissues appear unremarkable. IMPRESSION: Mildly displaced fracture of the right femoral neck. No dislocation. Electronically Signed   By: Anner Crete M.D.   On: 11/19/2017 18:11     EKG: Not done in ED, will get one.   Assessment/Plan Principal Problem:   Closed hip fracture, right, initial encounter [facility]) Active Problems:   SVT (supraventricular tachycardia) (HCC)   COPD GOLD III/ emphysematous predominantly    Essential hypertension   Hyperlipidemia   Depression with anxiety   Fall   Hyponatremia   Closed right hip fracture (HCC)   Acute renal failure superimposed on stage 3 chronic kidney disease  ([place])   Closed hip fracture, right, initial encounter [facility]): As evidenced by x-ray. Patient has moderate pain now. No neurovascular compromise. Orthopedic surgeon was consulted. Dr. Percell Miller will do surgery tomorrow  - will admit to Med-surg bed - Pain control: morphine prn and percocet - When necessary Zofran for nausea - Robaxin for muscle spasm - type and cross - INR/PTT -consult to SW and CM  Fall: pt seems have had mechanic fall. She strongly denies any head or neck injury. Refused CT head and neck -PT/OT when able to  SVT (supraventricular tachycardia) ([place]): HR 80. -continue metoprolol and flecainide  COPD GOLD III/ emphysematous predominantly: stable -continue home bronchodilators and when necessary Xopenex nebulizer  HTN:  -Continue home medications: Metoprolol -IV hydralazine prn  HLD: -Zocor  Depression and anxiety: Stable, no suicidal or homicidal ideations. -Continue home medications: prn Xanax  AoCKD-III: Baseline Cre is , pt's Cre is on admission. Likely due to prerenal secondary to dehydration and continuation of ACEI, ARB, diuretics, NSAIDs. - IVF: NS 100 cc/h - Check FeNa  - Follow up renal function by BMP  Hyponatremia: Na 127. Mental status normal, likely due to poor oral intake and dehydration - Will check urine sodium, urine osmolality, serum osmolality. - check TSH - normal saline at 100 mL/h     DVT ppx: SCD Code Status: Full code Family Communication:   Yes, patient's daughter in law   at bed side Disposition Plan:  To be determined Consults called:  Ortho, Dr. Percell Miller Admission status:   Inpatient/tele      Date of Service 12/07/2017    Ivor Costa [facility] Pager [phone]  If 7PM-7AM, please contact night-coverage [email or web address] Password [facility] 11/12/2017, 9:36 PM

## 2017-12-05 NOTE — ED Triage Notes (Signed)
Patient arrived to ED via [facility] from home. Lives alone. EMS reports:  Patient slipped on hardwood floor and fell. Lives alone. Laid on floor x 45 min, and then dragged self across the floor to reach her phone to call for help. Denies LOC. Denies striking her head during fall. C/o R hip pain.  EMS administered Fentanyl 100 mcg.  Patient wears home oxygen 2 LPM via nasal cannula.  Hx - COPD BP 125/84 prior to Fentanyl. Increased to 178/94 after Fentanyl. Pulse 80's. 96-97% on 2 LPM. CBG 141

## 2017-12-05 NOTE — ED Provider Notes (Signed)
[place] EMERGENCY DEPARTMENT Provider Note   CSN: [id number] Arrival date & time: 11/30/2017  1616     History   Chief Complaint Chief Complaint  Patient presents with  . Hip Injury  . Fall    HPI Carol Carson is a 76 y.o. female.  Patient is a 76 year old female who presents with hip pain after a fall.  She states she was trying to get her slippers on on the hardwood floor and slipped and fell over onto her right side.  She complains of pain to her right hip.  She denies any other injuries.  She states she did not hit her head.  She denies any neck or back pain.  She received 100 mcg of fentanyl by EMS prior to arrival and is feeling a little bit better but still complains of pain in her hip.  She does have a history of COPD and is on home oxygen at 2 L/min.      Past Medical History:  Diagnosis Date  . Chronic renal insufficiency   . COPD (chronic obstructive pulmonary disease) ([place])   . Diverticulosis   . DJD (degenerative joint disease)   . Endometrial cancer ([place])   . Hyperlipidemia   . Hypertension   . IBS (irritable bowel syndrome)   . RLS (restless legs syndrome)   . SVT (supraventricular tachycardia) ([place])   . Uterine cancer [facility])     Patient Active Problem List   Diagnosis Date Noted  . Uterine cancer ([place])   . IBS (irritable bowel syndrome)   . Hypertension   . Hyperlipidemia   . Endometrial cancer ([place])   . DJD (degenerative joint disease)   . Diverticulosis   . COPD (chronic obstructive pulmonary disease) ([place])   . Chronic renal insufficiency   . Acute on chronic respiratory failure with hypoxia ([place])   . Tachycardia 10/17/2017  . Physical deconditioning 10/17/2017  . Coma ([place]) 10/10/2017  . Claudication ([place]) 10/08/2016  . RLS (restless legs syndrome) 10/10/2015  . Essential hypertension   . COPD exacerbation ([place]) 08/05/2013  . Acute on chronic respiratory failure with hypoxia and hypercapnia ([place]) 08/05/2013  . COPD  GOLD III/ emphysematous predominantly  01/13/2013  . SVT (supraventricular tachycardia) (HCC)     Past Surgical History:  Procedure Laterality Date  . ABDOMINAL HYSTERECTOMY     d/t unterine cancer///no chemo/no radiation  . LUNG SURGERY  2002   golf ball sized tumor, left lung---spot vanished     OB History    No data available       Home Medications    Prior to Admission medications   Medication Sig Start Date End Date Taking? Authorizing Provider  ALPRAZolam (XANAX) 0.25 MG tablet Take 0.25 mg by mouth 2 (two) times daily as needed for anxiety or sleep.  02/08/15  Yes [provider]  B Complex-C (SUPER B COMPLEX PO) Take 1 tablet daily by mouth.   Yes [provider]  cholecalciferol (VITAMIN D) 1000 UNITS tablet Take 1,000 Units by mouth daily.   Yes [provider]  feeding supplement, ENSURE ENLIVE, (ENSURE ENLIVE) LIQD Take 237 mLs daily by mouth. 10/20/17  Yes Regalado, Belkys A, MD  ferrous sulfate 325 (65 FE) MG tablet Take 325 mg by mouth daily as needed (for iron).    Yes [provider]  flecainide (TAMBOCOR) 50 MG tablet Take 1 tablet (50 mg total) by mouth 2 (two) times daily. 07/29/17  Yes Camnitz, Ocie Doyne, MD  levalbuterol (XOPENEX HFA) 45 MCG/ACT inhaler Inhale 2 puffs into the lungs every 4 (four) hours as needed for wheezing or shortness of breath.   Yes [provider]  metoprolol tartrate (LOPRESSOR) 50 MG tablet Take 1 tablet (50 mg total) every 6 (six) hours by mouth. 10/20/17  Yes Regalado, Belkys A, MD  Mometasone Furoate (ASMANEX HFA) 100 MCG/ACT AERO Inhale 2 puffs into the lungs 2 (two) times daily. 11/24/17  Yes Brand Males, MD  Multiple Vitamin (MULTIVITAMIN WITH MINERALS) TABS tablet Take 1 tablet daily by mouth. Centrum   Yes [provider]  simvastatin (ZOCOR) 20 MG tablet TAKE 1 TABLET (20 MG) BY MOUTH IN THE EVENING 07/31/17  Yes [provider]  umeclidinium-vilanterol (ANORO  ELLIPTA) 62.5-25 MCG/INH AEPB Inhale 1 puff into the lungs daily. 07/17/17  Yes Brand Males, MD  OXYGEN Inhale 2-4 L See admin instructions into the lungs. 2lpm with rest, 4 with exertion and 3 lpm with sleep    [provider]  phosphorus (K PHOS NEUTRAL) 155-852-130 MG tablet Take 2 tablets (500 mg total) 2 (two) times daily by mouth. Patient not taking: Reported on 11/24/2017 10/20/17   Regalado, Jerald Kief A, MD  rOPINIRole (REQUIP) 0.25 MG tablet Take 1 tablet (0.25 mg total) by mouth 2 (two) times daily. Patient not taking: Reported on 11/24/2017 04/14/17   Alda Berthold, DO    Family History Family History  Problem Relation Age of Onset  . Uterine cancer Mother   . Brain cancer Father   . Parkinson's disease Brother   . Aneurysm Sister        Brain aneusrym  . Stroke Sister   . Healthy Son   . Breast cancer Neg Hx     Social History Social History   Tobacco Use  . Smoking status: Former Smoker    Packs/day: 1.30    Years: 43.00    Pack years: 55.90    Types: Cigarettes    Last attempt to quit: 12/08/1998    Years since quitting: 19.0  . Smokeless tobacco: Never Used  Substance Use Topics  . Alcohol use: No    Alcohol/week: 0.0 oz  . Drug use: No     Allergies   Contrast media [iodinated diagnostic agents]; Biaxin [clarithromycin]; Penicillins; and Sulfa antibiotics   Review of Systems Review of Systems  Constitutional: Negative for fever.  Gastrointestinal: Negative for nausea and vomiting.  Musculoskeletal: Positive for arthralgias. Negative for back pain, joint swelling and neck pain.  Skin: Negative for wound.  Neurological: Negative for weakness, numbness and headaches.  All other systems reviewed and are negative.    Physical Exam Updated Vital Signs BP (!) 138/55   Pulse 80   Temp 98.3 F (36.8 C) (Oral)   Resp 17   SpO2 93%   Physical Exam  Constitutional: She is oriented to person, place, and time. She appears well-developed and  well-nourished.  HENT:  Head: Normocephalic and atraumatic.  Eyes: Pupils are equal, round, and reactive to light.  Neck: Normal range of motion. Neck supple.  No pain to the cervical thoracic or lumbosacral spine.  Cardiovascular: Normal rate, regular rhythm and normal heart sounds.  Pulmonary/Chest: Effort normal and breath sounds normal. No respiratory distress. She has no wheezes. She has no rales. She exhibits no tenderness.  Abdominal: Soft. Bowel sounds are normal. There is no tenderness. There is no rebound and no guarding.  Musculoskeletal: She exhibits no edema.  Patient is lying on her left side.  She has pain on any movement of her right hip.  There is some mild pain to the right knee but no swelling or deformity.  Pedal pulses are intact.  There is no other pain on palpation or range of motion of her extremities.  Lymphadenopathy:    She has no cervical adenopathy.  Neurological: She is alert and oriented to person, place, and time.  Skin: Skin is warm and dry. No rash noted.  Psychiatric: She has a normal mood and affect.     ED Treatments / Results  Labs (all labs ordered are listed, but only abnormal results are displayed) Labs Reviewed  BASIC METABOLIC PANEL  CBC WITH DIFFERENTIAL/PLATELET  PROTIME-INR  TYPE AND SCREEN    EKG  EKG Interpretation None       Radiology Dg Chest 1 View  Result Date: 11/09/2017 CLINICAL DATA:  Right hip fracture EXAM: CHEST 1 VIEW COMPARISON:  10/15/2017 chest radiograph. FINDINGS: Decubitus views were obtained. Stable cardiomediastinal silhouette with normal heart size and aortic atherosclerosis. No pneumothorax. No pleural effusion. Peribronchial cuffing throughout both lungs is unchanged. No acute consolidative airspace disease or pulmonary edema. Relative hypoinflation of the left lung is probably due to technique. IMPRESSION: No acute cardiopulmonary disease. Stable chronic diffuse peribronchial cuffing suggesting chronic  bronchitis. Electronically Signed   By: Ilona Sorrel M.D.   On: 12/03/2017 18:26   Dg Hip Unilat W Or Wo Pelvis 2-3 Views Right  Result Date: 11/10/2017 CLINICAL DATA:  76 year old female with fall and right hip pain. EXAM: DG HIP (WITH OR WITHOUT PELVIS) 2-3V RIGHT COMPARISON:  None. FINDINGS: There is a minimally displaced fracture of the right femoral neck with intertrochanteric extension. There is varus angulation of the femoral shaft. There is no dislocation. The bones are osteopenic. There is no dislocation. There is degenerative changes of the lower lumbar spine. The soft tissues appear unremarkable. IMPRESSION: Mildly displaced fracture of the right femoral neck. No dislocation. Electronically Signed   By: Anner Crete M.D.   On: 11/18/2017 18:11    Procedures Procedures (including critical care time)  Medications Ordered in ED Medications  morphine 4 MG/ML injection 4 mg (4 mg Intravenous Given 11/13/2017 2016)  fentaNYL (SUBLIMAZE) injection 50 mcg (50 mcg Intravenous Given 11/21/2017 1716)     Initial Impression / Assessment and Plan / ED Course  I have reviewed the triage vital signs and the nursing notes.  Pertinent labs & imaging results that were available during my care of the patient were reviewed by me and considered in my medical decision making (see chart for details).     Patient with a right hip fracture after mechanical fall.  She has no other apparent injuries.  She denies hitting her head.  I spoke with Dr. Percell Miller who states that he will operate on the hip tomorrow morning.  She needs to remain n.p.o. after midnight.  I spoke with Dr. Blaine Hamper who will admit the pt.  Final Clinical Impressions(s) / ED Diagnoses   Final diagnoses:  Fall, initial encounter  Closed fracture of right hip, initial encounter [facility])    ED Discharge Orders    None       Malvin Johns, MD 11/11/2017 2028

## 2017-12-06 ENCOUNTER — Inpatient Hospital Stay (HOSPITAL_COMMUNITY): Payer: Medicare Other

## 2017-12-06 ENCOUNTER — Inpatient Hospital Stay (HOSPITAL_COMMUNITY): Payer: Medicare Other | Admitting: Anesthesiology

## 2017-12-06 ENCOUNTER — Other Ambulatory Visit: Payer: Self-pay

## 2017-12-06 ENCOUNTER — Encounter (HOSPITAL_COMMUNITY): Admission: EM | Disposition: E | Payer: Self-pay | Source: Home / Self Care | Attending: Family Medicine

## 2017-12-06 ENCOUNTER — Encounter (HOSPITAL_COMMUNITY): Payer: Self-pay

## 2017-12-06 DIAGNOSIS — J9611 Chronic respiratory failure with hypoxia: Secondary | ICD-10-CM

## 2017-12-06 DIAGNOSIS — E871 Hypo-osmolality and hyponatremia: Secondary | ICD-10-CM

## 2017-12-06 HISTORY — DX: Chronic respiratory failure with hypoxia: J96.11

## 2017-12-06 HISTORY — PX: INTRAMEDULLARY (IM) NAIL INTERTROCHANTERIC: SHX5875

## 2017-12-06 LAB — CBC
HCT: 30.8 % — ABNORMAL LOW (ref 36.0–46.0)
HCT: 30.5 % — ABNORMAL LOW (ref 36.0–46.0)
HEMOGLOBIN: 9.4 g/dL — AB (ref 12.0–15.0)
HEMOGLOBIN: 9.1 g/dL — AB (ref 12.0–15.0)
MCH: 27.6 pg (ref 26.0–34.0)
MCH: 27.2 pg (ref 26.0–34.0)
MCHC: 30.5 g/dL (ref 30.0–36.0)
MCHC: 29.8 g/dL — ABNORMAL LOW (ref 30.0–36.0)
MCV: 90.6 fL (ref 78.0–100.0)
MCV: 91.3 fL (ref 78.0–100.0)
PLATELETS: 277 10*3/uL (ref 150–400)
PLATELETS: 275 10*3/uL (ref 150–400)
RBC: 3.4 MIL/uL — ABNORMAL LOW (ref 3.87–5.11)
RBC: 3.34 MIL/uL — AB (ref 3.87–5.11)
RDW: 14.6 % (ref 11.5–15.5)
RDW: 14.5 % (ref 11.5–15.5)
WBC: 8.9 10*3/uL (ref 4.0–10.5)
WBC: 13.2 10*3/uL — AB (ref 4.0–10.5)

## 2017-12-06 LAB — BASIC METABOLIC PANEL
ANION GAP: 9 (ref 5–15)
Anion gap: 10 (ref 5–15)
BUN: 16 mg/dL (ref 6–20)
BUN: 16 mg/dL (ref 6–20)
CALCIUM: 8.3 mg/dL — AB (ref 8.9–10.3)
CHLORIDE: 81 mmol/L — AB (ref 101–111)
CHLORIDE: 87 mmol/L — AB (ref 101–111)
CO2: 35 mmol/L — ABNORMAL HIGH (ref 22–32)
CO2: 32 mmol/L (ref 22–32)
CREATININE: 1.57 mg/dL — AB (ref 0.44–1.00)
Calcium: 7.9 mg/dL — ABNORMAL LOW (ref 8.9–10.3)
Creatinine, Ser: 1.44 mg/dL — ABNORMAL HIGH (ref 0.44–1.00)
GFR, EST AFRICAN AMERICAN: 36 mL/min — AB (ref >60)
GFR, EST AFRICAN AMERICAN: 39 mL/min — AB (ref >60)
GFR, EST NON AFRICAN AMERICAN: 31 mL/min — AB (ref >60)
GFR, EST NON AFRICAN AMERICAN: 34 mL/min — AB (ref >60)
Glucose, Bld: 129 mg/dL — ABNORMAL HIGH (ref 65–99)
Glucose, Bld: 142 mg/dL — ABNORMAL HIGH (ref 65–99)
POTASSIUM: 4.6 mmol/L (ref 3.5–5.1)
Potassium: 4.5 mmol/L (ref 3.5–5.1)
SODIUM: 126 mmol/L — AB (ref 135–145)
SODIUM: 128 mmol/L — AB (ref 135–145)

## 2017-12-06 LAB — SURGICAL PCR SCREEN
MRSA, PCR: NEGATIVE
STAPHYLOCOCCUS AUREUS: NEGATIVE

## 2017-12-06 LAB — TSH: TSH: 5.07 u[IU]/mL — ABNORMAL HIGH (ref 0.350–4.500)

## 2017-12-06 SURGERY — FIXATION, FRACTURE, INTERTROCHANTERIC, WITH INTRAMEDULLARY ROD
Anesthesia: General | Laterality: Right

## 2017-12-06 MED ORDER — CEFAZOLIN SODIUM-DEXTROSE 2-4 GM/100ML-% IV SOLN
2.0000 g | INTRAVENOUS | Status: DC
  Filled 2017-12-06: qty 100

## 2017-12-06 MED ORDER — HYDROMORPHONE HCL 1 MG/ML IJ SOLN
0.2500 mg | INTRAMUSCULAR | Status: DC | PRN
Start: 2017-12-06 — End: 2017-12-06

## 2017-12-06 MED ORDER — MIDAZOLAM HCL 2 MG/2ML IJ SOLN
INTRAMUSCULAR | Status: AC
  Filled 2017-12-06: qty 2

## 2017-12-06 MED ORDER — POLYETHYLENE GLYCOL 3350 17 G PO PACK
17.0000 g | PACK | Freq: Every day | ORAL | Status: DC
  Administered 2017-12-07 – 2017-12-10 (×3): 17 g via ORAL
  Filled 2017-12-06 (×4): qty 1

## 2017-12-06 MED ORDER — PROMETHAZINE HCL 25 MG/ML IJ SOLN: 6.2500 mg | INTRAMUSCULAR | Status: DC | PRN

## 2017-12-06 MED ORDER — ENOXAPARIN SODIUM 40 MG/0.4ML ~~LOC~~ SOLN: 40.0000 mg | SUBCUTANEOUS | 0 refills | Status: AC

## 2017-12-06 MED ORDER — SUGAMMADEX SODIUM 200 MG/2ML IV SOLN
INTRAVENOUS | Status: DC | PRN
  Administered 2017-12-06: 200 mg via INTRAVENOUS

## 2017-12-06 MED ORDER — FENTANYL CITRATE (PF) 250 MCG/5ML IJ SOLN
INTRAMUSCULAR | Status: AC
  Filled 2017-12-06: qty 5

## 2017-12-06 MED ORDER — LACTATED RINGERS IV SOLN
INTRAVENOUS | Status: DC
  Administered 2017-12-06: 11:00:00 via INTRAVENOUS

## 2017-12-06 MED ORDER — ALBUTEROL SULFATE HFA 108 (90 BASE) MCG/ACT IN AERS
INHALATION_SPRAY | RESPIRATORY_TRACT | Status: AC
  Filled 2017-12-06: qty 6.7

## 2017-12-06 MED ORDER — HYDROCODONE-ACETAMINOPHEN 5-325 MG PO TABS
1.0000 | ORAL_TABLET | Freq: Four times a day (QID) | ORAL | Status: DC | PRN
  Administered 2017-12-07 – 2017-12-08 (×4): 1 via ORAL
  Filled 2017-12-06 (×4): qty 1

## 2017-12-06 MED ORDER — PHENOL 1.4 % MT LIQD: 1.0000 | OROMUCOSAL | Status: DC | PRN

## 2017-12-06 MED ORDER — ACETAMINOPHEN 500 MG PO TABS: 1000.0000 mg | ORAL_TABLET | Freq: Once | ORAL | Status: DC

## 2017-12-06 MED ORDER — ONDANSETRON HCL 4 MG/2ML IJ SOLN
INTRAMUSCULAR | Status: DC | PRN
  Administered 2017-12-06: 4 mg via INTRAVENOUS

## 2017-12-06 MED ORDER — PROPOFOL 10 MG/ML IV BOLUS
INTRAVENOUS | Status: DC | PRN
  Administered 2017-12-06: 80 mg via INTRAVENOUS

## 2017-12-06 MED ORDER — FENTANYL CITRATE (PF) 250 MCG/5ML IJ SOLN
INTRAMUSCULAR | Status: DC | PRN
  Administered 2017-12-06: 50 ug via INTRAVENOUS

## 2017-12-06 MED ORDER — DEXAMETHASONE SODIUM PHOSPHATE 10 MG/ML IJ SOLN
INTRAMUSCULAR | Status: DC | PRN
  Administered 2017-12-06: 6 mg via INTRAVENOUS

## 2017-12-06 MED ORDER — DOCUSATE SODIUM 100 MG PO CAPS: 100.0000 mg | ORAL_CAPSULE | Freq: Two times a day (BID) | ORAL | Status: DC

## 2017-12-06 MED ORDER — ONDANSETRON HCL 4 MG/2ML IJ SOLN
INTRAMUSCULAR | Status: AC
  Filled 2017-12-06: qty 2

## 2017-12-06 MED ORDER — HYDROCODONE-ACETAMINOPHEN 5-325 MG PO TABS: 1.0000 | ORAL_TABLET | Freq: Four times a day (QID) | ORAL | 0 refills | Status: AC | PRN

## 2017-12-06 MED ORDER — MENTHOL 3 MG MT LOZG: 1.0000 | LOZENGE | OROMUCOSAL | Status: DC | PRN

## 2017-12-06 MED ORDER — SUGAMMADEX SODIUM 200 MG/2ML IV SOLN
INTRAVENOUS | Status: AC
  Filled 2017-12-06: qty 2

## 2017-12-06 MED ORDER — ACETAMINOPHEN 325 MG PO TABS
650.0000 mg | ORAL_TABLET | Freq: Three times a day (TID) | ORAL | Status: AC
  Administered 2017-12-06 – 2017-12-07 (×2): 650 mg via ORAL
  Filled 2017-12-06: qty 2

## 2017-12-06 MED ORDER — PHENYLEPHRINE HCL 10 MG/ML IJ SOLN
INTRAMUSCULAR | Status: DC | PRN
  Administered 2017-12-06: 50 ug/min via INTRAVENOUS

## 2017-12-06 MED ORDER — POVIDONE-IODINE 10 % EX SWAB: 2.0000 "application " | Freq: Once | CUTANEOUS | Status: DC

## 2017-12-06 MED ORDER — LIDOCAINE 2% (20 MG/ML) 5 ML SYRINGE
INTRAMUSCULAR | Status: AC
  Filled 2017-12-06: qty 5

## 2017-12-06 MED ORDER — ROCURONIUM BROMIDE 10 MG/ML (PF) SYRINGE
PREFILLED_SYRINGE | INTRAVENOUS | Status: DC | PRN
  Administered 2017-12-06: 50 mg via INTRAVENOUS

## 2017-12-06 MED ORDER — ALBUTEROL SULFATE HFA 108 (90 BASE) MCG/ACT IN AERS
INHALATION_SPRAY | RESPIRATORY_TRACT | Status: DC | PRN
  Administered 2017-12-06 (×2): 4 via RESPIRATORY_TRACT

## 2017-12-06 MED ORDER — CHLORHEXIDINE GLUCONATE 4 % EX LIQD
60.0000 mL | Freq: Once | CUTANEOUS | Status: DC
  Administered 2017-12-06: 4 via TOPICAL

## 2017-12-06 MED ORDER — POLYETHYLENE GLYCOL 3350 17 G PO PACK: 17.0000 g | PACK | Freq: Every day | ORAL | Status: DC | PRN

## 2017-12-06 MED ORDER — PHENYLEPHRINE 40 MCG/ML (10ML) SYRINGE FOR IV PUSH (FOR BLOOD PRESSURE SUPPORT)
PREFILLED_SYRINGE | INTRAVENOUS | Status: DC | PRN
  Administered 2017-12-06: 80 ug via INTRAVENOUS
  Administered 2017-12-06 (×2): 120 ug via INTRAVENOUS

## 2017-12-06 MED ORDER — LIDOCAINE 2% (20 MG/ML) 5 ML SYRINGE
INTRAMUSCULAR | Status: DC | PRN
  Administered 2017-12-06: 60 mg via INTRAVENOUS

## 2017-12-06 MED ORDER — DEXAMETHASONE SODIUM PHOSPHATE 10 MG/ML IJ SOLN
INTRAMUSCULAR | Status: AC
  Filled 2017-12-06: qty 1

## 2017-12-06 MED ORDER — ENOXAPARIN SODIUM 40 MG/0.4ML ~~LOC~~ SOLN
40.0000 mg | SUBCUTANEOUS | Status: DC
  Administered 2017-12-07 – 2017-12-08 (×2): 40 mg via SUBCUTANEOUS
  Filled 2017-12-06 (×2): qty 0.4

## 2017-12-06 SURGICAL SUPPLY — 34 items
BNDG COHESIVE 4X5 TAN STRL (GAUZE/BANDAGES/DRESSINGS) ×3 IMPLANT
BNDG GAUZE ELAST 4 BULKY (GAUZE/BANDAGES/DRESSINGS) ×3 IMPLANT
COVER PERINEAL POST (MISCELLANEOUS) ×3 IMPLANT
COVER SURGICAL LIGHT HANDLE (MISCELLANEOUS) ×3 IMPLANT
DRAPE STERI IOBAN 125X83 (DRAPES) ×3 IMPLANT
DRSG MEPILEX BORDER 4X4 (GAUZE/BANDAGES/DRESSINGS) ×6 IMPLANT
DURAPREP 26ML APPLICATOR (WOUND CARE) ×3 IMPLANT
ELECT REM PT RETURN 9FT ADLT (ELECTROSURGICAL) ×3
ELECTRODE REM PT RTRN 9FT ADLT (ELECTROSURGICAL) ×1 IMPLANT
GLOVE BIO SURGEON STRL SZ7.5 (GLOVE) ×6 IMPLANT
GLOVE BIOGEL PI IND STRL 8 (GLOVE) ×2 IMPLANT
GLOVE BIOGEL PI INDICATOR 8 (GLOVE) ×4
GOWN STRL REUS W/ TWL LRG LVL3 (GOWN DISPOSABLE) ×3 IMPLANT
GOWN STRL REUS W/TWL LRG LVL3 (GOWN DISPOSABLE) ×9
GUIDEROD T2 3X1000 (ROD) ×2 IMPLANT
K-WIRE  3.2X450M STR (WIRE) ×2
K-WIRE 3.2X450M STR (WIRE) ×1
KIT ROOM TURNOVER OR (KITS) ×3 IMPLANT
KWIRE 3.2X450M STR (WIRE) IMPLANT
MANIFOLD NEPTUNE II (INSTRUMENTS) ×3 IMPLANT
NAIL GAMMA LG R 5TI 10X360X125 (Nail) ×2 IMPLANT
NS IRRIG 1000ML POUR BTL (IV SOLUTION) ×3 IMPLANT
PACK GENERAL/GYN (CUSTOM PROCEDURE TRAY) ×3 IMPLANT
PAD ARMBOARD 7.5X6 YLW CONV (MISCELLANEOUS) ×3 IMPLANT
PAD CAST 4YDX4 CTTN HI CHSV (CAST SUPPLIES) ×1 IMPLANT
PADDING CAST COTTON 4X4 STRL (CAST SUPPLIES) ×3
SCREW LAG GAMMA 3 TI 10.5X80MM (Screw) ×2 IMPLANT
SUT MNCRL AB 4-0 PS2 18 (SUTURE) IMPLANT
SUT MON AB 2-0 CT1 27 (SUTURE) ×3 IMPLANT
SUT VIC AB 0 CT1 27 (SUTURE) ×3
SUT VIC AB 0 CT1 27XBRD ANBCTR (SUTURE) ×1 IMPLANT
TAPE STRIPS DRAPE STRL (GAUZE/BANDAGES/DRESSINGS) ×2 IMPLANT
TOWEL OR 17X24 6PK STRL BLUE (TOWEL DISPOSABLE) ×1 IMPLANT
TOWEL OR 17X26 10 PK STRL BLUE (TOWEL DISPOSABLE) ×3 IMPLANT

## 2017-12-06 NOTE — H&P (View-Only) (Signed)
ORTHOPAEDIC CONSULTATION  REQUESTING PHYSICIAN: Samuella Cota, MD  Chief Complaint: Right hip pain, mechanical fall  Assessment / Plan: Principal Problem:   Closed hip fracture, right, initial encounter [facility]) Active Problems:   SVT (supraventricular tachycardia) (HCC)   COPD GOLD III/ emphysematous predominantly    Essential hypertension   Hyperlipidemia   Depression with anxiety   Fall   Hyponatremia   Closed right hip fracture (HCC)   Acute renal failure superimposed on stage 3 chronic kidney disease ([place])  Displaced right intertrochanteric femur fracture Plan for Operative fixation today -NPO -Medicine team to admit and perform pre-op clearance -Type and Cross for 2 units held for OR -PT/OT post op -Will ammend WB status postop, bedrest for now -Foley for comfort to be removed POD 1/2 -Likely to require Rehab or SNF placement upon discharge.  -VTE prophylaxis: SCDs.  We will amend postoperatively, likely Lovenox for 30 days  HPI: Carol Carson is a 76 y.o. female who complains of right hip pain after fall at home.  She slipped and denies lightheadedness, dizziness, or syncope.  She fell directly onto her right hip feeling immediate pain and inability to bear weight.  She presented to the emergency department where CT scan showed mildly displaced intertrochanteric fracture of the right hip with a varus deformity and mild impaction. No femoral neck fracture is identified.  Orthopedics was consulted for evaluation  Today her pain is under control when she lies still.  Last meal yesterday. She denies history of MI, CVA, DVT, PE.  She was recently hospitalized for acute on chronic respiratory failure, hypoxia and hypercapnia/COPD. Previously ambulatory without aid.  The patient was living in her home.  She does have a son that lives in town.  Past Medical History:  Diagnosis Date  . Chronic renal insufficiency   . COPD (chronic obstructive pulmonary disease)  ([place])   . Diverticulosis   . DJD (degenerative joint disease)   . Endometrial cancer ([place])   . Hyperlipidemia   . Hypertension   . IBS (irritable bowel syndrome)   . RLS (restless legs syndrome)   . SVT (supraventricular tachycardia) ([place])   . Uterine cancer [facility])    Past Surgical History:  Procedure Laterality Date  . ABDOMINAL HYSTERECTOMY     d/t unterine cancer///no chemo/no radiation  . LUNG SURGERY  2002   golf ball sized tumor, left lung---spot vanished    Social History   Socioeconomic History  . Marital status: Widowed    Spouse name: None  . Number of children: None  . Years of education: None  . Highest education level: None  Social Needs  . Financial resource strain: None  . Food insecurity - worry: None  . Food insecurity - inability: None  . Transportation needs - medical: None  . Transportation needs - non-medical: None  Occupational History  . Occupation: retired  Tobacco Use  . Smoking status: Former Smoker    Packs/day: 1.30    Years: 43.00    Pack years: 55.90    Types: Cigarettes    Last attempt to quit: 12/08/1998    Years since quitting: 19.0  . Smokeless tobacco: Never Used  Substance and Sexual Activity  . Alcohol use: No    Alcohol/week: 0.0 oz  . Drug use: No  . Sexual activity: None  Other Topics Concern  . None  Social History Narrative   Lives alone in a one story home.  Has 1 son.  Has not worked since the 60's.     Education: high school.   Family History  Problem Relation Age of Onset  . Uterine cancer Mother   . Brain cancer Father   . Parkinson's disease Brother   . Aneurysm Sister        Brain aneusrym  . Stroke Sister   . Healthy Son   . Breast cancer Neg Hx    Allergies  Allergen Reactions  . Contrast Media [Iodinated Diagnostic Agents] Other (See Comments)    LOW KIDNEY FUNCTION///NEED TO CHECK WITH NEPHROLOGIST BEFORE PROCEDURE.  . Biaxin [Clarithromycin] Hives  . Penicillins Hives    Has patient had a PCN  reaction causing immediate rash, facial/tongue/throat swelling, SOB or lightheadedness with hypotension: Yes Has patient had a PCN reaction causing severe rash involving mucus membranes or skin necrosis: No Has patient had a PCN reaction that required hospitalization: No Has patient had a PCN reaction occurring within the last 10 years: No If all of the above answers are "NO", then may proceed with Cephalosporin use.  . Sulfa Antibiotics Rash   Prior to Admission medications   Medication Sig Start Date End Date Taking? Authorizing Provider  ALPRAZolam (XANAX) 0.25 MG tablet Take 0.25 mg by mouth 2 (two) times daily as needed for anxiety or sleep.  02/08/15  Yes [provider]  B Complex-C (SUPER B COMPLEX PO) Take 1 tablet daily by mouth.   Yes [provider]  cholecalciferol (VITAMIN D) 1000 UNITS tablet Take 1,000 Units by mouth daily.   Yes [provider]  feeding supplement, ENSURE ENLIVE, (ENSURE ENLIVE) LIQD Take 237 mLs daily by mouth. 10/20/17  Yes Regalado, Belkys A, MD  ferrous sulfate 325 (65 FE) MG tablet Take 325 mg by mouth daily as needed (for iron).    Yes [provider]  flecainide (TAMBOCOR) 50 MG tablet Take 1 tablet (50 mg total) by mouth 2 (two) times daily. 07/29/17  Yes Camnitz, Will Hassell Done, MD  levalbuterol [facility] HFA) 45 MCG/ACT inhaler Inhale 2 puffs into the lungs every 4 (four) hours as needed for wheezing or shortness of breath.   Yes [provider]  metoprolol tartrate (LOPRESSOR) 50 MG tablet Take 1 tablet (50 mg total) every 6 (six) hours by mouth. 10/20/17  Yes Regalado, Belkys A, MD  Mometasone Furoate (ASMANEX HFA) 100 MCG/ACT AERO Inhale 2 puffs into the lungs 2 (two) times daily. 11/24/17  Yes Brand Males, MD  Multiple Vitamin (MULTIVITAMIN WITH MINERALS) TABS tablet Take 1 tablet daily by mouth. Centrum   Yes [provider]  simvastatin (ZOCOR) 20 MG tablet TAKE 1 TABLET (20 MG) BY MOUTH IN THE  EVENING 07/31/17  Yes [provider]  umeclidinium-vilanterol (ANORO ELLIPTA) 62.5-25 MCG/INH AEPB Inhale 1 puff into the lungs daily. 07/17/17  Yes Brand Males, MD  OXYGEN Inhale 2-4 L See admin instructions into the lungs. 2lpm with rest, 4 with exertion and 3 lpm with sleep    [provider]  phosphorus (K PHOS NEUTRAL) 155-852-130 MG tablet Take 2 tablets (500 mg total) 2 (two) times daily by mouth. Patient not taking: Reported on 11/24/2017 10/20/17   Regalado, Jerald Kief A, MD  rOPINIRole (REQUIP) 0.25 MG tablet Take 1 tablet (0.25 mg total) by mouth 2 (two) times daily. Patient not taking: Reported on 11/24/2017 04/14/17   Alda Berthold, DO   Dg Chest 1 View  Result Date: 11/16/2017 CLINICAL DATA:  Right hip fracture EXAM: CHEST 1 VIEW COMPARISON:  10/15/2017 chest radiograph. FINDINGS: Decubitus views were obtained. Stable cardiomediastinal silhouette with normal heart size and aortic atherosclerosis. No pneumothorax. No pleural effusion. Peribronchial cuffing throughout both lungs is unchanged. No acute consolidative airspace disease or pulmonary edema. Relative hypoinflation of the left lung is probably due to technique. IMPRESSION: No acute cardiopulmonary disease. Stable chronic diffuse peribronchial cuffing suggesting chronic bronchitis. Electronically Signed   By: Ilona Sorrel M.D.   On: 11/14/2017 18:26   Ct Hip Right Wo Contrast  Result Date: 11/30/2017 CLINICAL DATA:  [place].  Right hip fracture. EXAM: CT OF THE RIGHT HIP WITHOUT CONTRAST TECHNIQUE: Multidetector CT imaging of the right hip was performed according to the standard protocol. Multiplanar CT image reconstructions were also generated. COMPARISON:  Radiographs 11/16/2017 FINDINGS: There is a mildly displaced intertrochanteric fracture of the right hip with a varus deformity and mild impaction. No femoral neck fracture is identified. Both hips are normally located. Moderate degenerative changes  bilaterally. Remote healed left pubic rami fractures. No new/ acute right-sided pelvic fractures. No significant intrapelvic abnormalities. IMPRESSION: Mildly displaced intertrochanteric fracture of the right hip. Electronically Signed   By: Marijo Sanes M.D.   On: 11/25/2017 20:47   Dg Hip Unilat W Or Wo Pelvis 2-3 Views Right  Result Date: 11/15/2017 CLINICAL DATA:  76 year old female with fall and right hip pain. EXAM: DG HIP (WITH OR WITHOUT PELVIS) 2-3V RIGHT COMPARISON:  None. FINDINGS: There is a minimally displaced fracture of the right femoral neck with intertrochanteric extension. There is varus angulation of the femoral shaft. There is no dislocation. The bones are osteopenic. There is no dislocation. There is degenerative changes of the lower lumbar spine. The soft tissues appear unremarkable. IMPRESSION: Mildly displaced fracture of the right femoral neck. No dislocation. Electronically Signed   By: Anner Crete M.D.   On: 11/26/2017 18:11    Positive ROS: All other systems have been reviewed and were otherwise negative with the exception of those mentioned in the HPI and as above.  Objective: Labs cbc Recent Labs    11/08/2017 2001 11/15/2017 0322  WBC 10.7* 8.9  HGB 9.2* 9.4*  HCT 29.0* 30.8*  PLT 266 277    Labs inflam No results for input(s): CRP in the last 72 hours.  Invalid input(s): ESR  Labs coag Recent Labs    11/08/2017 2001  INR 1.05    Recent Labs    11/11/2017 2001 11/16/2017 0322  NA 127* 126*  K 4.1 4.5  CL 81* 81*  CO2 33* 35*  GLUCOSE 105* 129*  BUN 18 16  CREATININE 1.60* 1.57*  CALCIUM 8.6* 8.3*    Physical Exam: Vitals:   11/22/2017 2200 11/22/2017 2248  BP: (!) 141/62 (!) 153/62  Pulse: 87 94  Resp: 17   Temp:  98 F (36.7 C)  SpO2: 93% 90%   General: Alert, no acute distress Mental status: Alert and Oriented x3 Neurologic: Speech Clear and organized, no gross focal findings or movement disorder appreciated. Respiratory: No  cyanosis, no use of accessory musculature Cardiovascular: No pedal edema GI: Abdomen is soft and non-tender, non-distended. Skin: Warm and dry.  No lesions in the area of chief complaint. Extremities: Warm and well perfused w/o edema Psychiatric: Patient is competent for consent with normal mood and affect  MUSCULOSKELETAL:  Right hip pain with range of motion.  No lesions in the area of chief complaint.  Neurovascular intact distally.  EHL FHL dorsiflexion plantarflexion intact. Other extremities are atraumatic with painless ROM and NVI.  Prudencio Burly III PA-C 11/11/2017 7:16 AM

## 2017-12-06 NOTE — Progress Notes (Signed)
Orthopedic Tech Progress Note Patient Details:  Carol Carson 1941-03-27 [id number]  Ortho Devices Ortho Device/Splint Location: Trapeze bar Ortho Device/Splint Interventions: Application   Post Interventions Patient Tolerated: Well Instructions Provided: Care of device   [place] Pink 12/02/2017, 7:08 PM

## 2017-12-06 NOTE — Anesthesia Procedure Notes (Signed)
Procedure Name: Intubation Date/Time: 11/08/2017 11:35 AM Performed by: Myna Bright, CRNA Pre-anesthesia Checklist: Patient identified, Emergency Drugs available, Suction available and Patient being monitored Patient Re-evaluated:Patient Re-evaluated prior to induction Oxygen Delivery Method: Circle system utilized Preoxygenation: Pre-oxygenation with 100% oxygen Induction Type: IV induction Ventilation: Mask ventilation without difficulty Laryngoscope Size: Mac and 3 Grade View: Grade I Tube type: Oral Tube size: 7.0 mm Number of attempts: 1 Airway Equipment and Method: Stylet Placement Confirmation: ETT inserted through vocal cords under direct vision,  positive ETCO2 and breath sounds checked- equal and bilateral Secured at: 21 cm Tube secured with: Tape Dental Injury: Teeth and Oropharynx as per pre-operative assessment

## 2017-12-06 NOTE — Anesthesia Preprocedure Evaluation (Addendum)
Anesthesia Evaluation  Patient identified by MRN, date of birth, ID band Patient awake    Reviewed: Allergy & Precautions, NPO status , Patient's Chart, lab work & pertinent test results  Airway Mallampati: II  TM Distance: >3 FB Neck ROM: Full    Dental no notable dental hx.    Pulmonary COPD,  oxygen dependent, former smoker,  Oxygen saturation on 2L 73% in holding area   + rhonchi  + decreased breath sounds+ wheezing      Cardiovascular hypertension, Normal cardiovascular exam Rhythm:Regular Rate:Normal  Technically difficult study. Definity contrast given. LVEF   60-65%, normal wall thickness, tachycardic, normal LA size, RVSP   35 mmHg, limited visualization of the RV, however, contrast   images suggest the RV is dilated and severely hypokinetic,   suggesting probable RV strain.   Neuro/Psych negative neurological ROS  negative psych ROS   GI/Hepatic negative GI ROS, Neg liver ROS,   Endo/Other  negative endocrine ROS  Renal/GU Renal InsufficiencyRenal diseasehyponatremia  negative genitourinary   Musculoskeletal negative musculoskeletal ROS (+)   Abdominal   Peds negative pediatric ROS (+)  Hematology  (+) anemia ,   Anesthesia Other Findings   Reproductive/Obstetrics negative OB ROS                           Anesthesia Physical Anesthesia Plan  ASA: IV and emergent  Anesthesia Plan: General   Post-op Pain Management:    Induction: Intravenous  PONV Risk Score and Plan: 2 and Ondansetron, Dexamethasone and Treatment may vary due to age or medical condition  Airway Management Planned: Oral ETT  Additional Equipment:   Intra-op Plan:   Post-operative Plan: Extubation in OR  Informed Consent: I have reviewed the patients History and Physical, chart, labs and discussed the procedure including the risks, benefits and alternatives for the proposed anesthesia with the patient  or authorized representative who has indicated his/her understanding and acceptance.   Dental advisory given  Plan Discussed with: CRNA and Surgeon  Anesthesia Plan Comments:        Anesthesia Quick Evaluation

## 2017-12-06 NOTE — Anesthesia Postprocedure Evaluation (Signed)
Anesthesia Post Note  Patient: Carol Carson  Procedure(s) Performed: INTRAMEDULLARY (IM) NAIL INTERTROCHANTRIC, RIGHT (Right )     Patient location during evaluation: PACU Anesthesia Type: General Level of consciousness: awake and alert Pain management: pain level controlled Vital Signs Assessment: post-procedure vital signs reviewed and stable Respiratory status: spontaneous breathing, nonlabored ventilation, respiratory function stable and patient connected to nasal cannula oxygen Cardiovascular status: blood pressure returned to baseline and stable Postop Assessment: no apparent nausea or vomiting Anesthetic complications: no    Last Vitals:  Vitals:   11/28/2017 1335 12/04/2017 1350  BP: (!) 109/53 (!) 117/46  Pulse: (!) 101 (!) 102  Resp: 19 16  Temp:    SpO2: 90% (!) 87%    Last Pain:  Vitals:   11/27/2017 1335  TempSrc:   PainSc: Asleep                 Keshonda Monsour S

## 2017-12-06 NOTE — Plan of Care (Signed)
Patient voices understanding of pain scale and calls for medication when needed

## 2017-12-06 NOTE — Progress Notes (Signed)
PROGRESS NOTE  Carol Carson TKW:[id number] DOB: 09-13-1941 DOA: 11/11/2017 PCP: Leeroy Cha, MD  Brief Narrative: 10yow PMH COPD presented with right hip pain s/p fall at home. Admitted for right hip fracture.  Assessment/Plan Closed right hip fx s/p mechanical fall at home. - s/p IM nail 12/30 - management per orthopedics  COPD, chronic hypoxic resp failure on home oxygen 2LNC - appears stable although currently requiring 4 L [place] post-op - continue supplemental oxygen, continuous pulse ox, bronchodilators  AKI superimposed on suspected CKD stage III. Suspect dehydration - IVF, check BMP in AM  Hypo-osmolar hyponatremia; hypochloridemia. TSH slightly high at 5.070. Likely secondary to dehydration - completely asymptomatic - continue IVF, serial BMP - repeat TSH as an outpatient  Normocytic anemia, stable since 10/2017  PMH SVT - continue metoprolol, flecainide  Essential HTN - continue metoprolol   DVT prophylaxis: enoxaparin Code Status: full Family Communication: daughter-in-law at bedside Disposition Plan: pending PT evaluation    Murray Hodgkins, MD  [facility] Direct contact: [phone] --[place]  --[email or web address]; password [email or web address]  7PM-7AM contact night coverage as above 12/07/2017, 2:56 PM  LOS: 1 day   Consultants:  Orthopedics   Procedures:  12/30 INTRAMEDULLARY (IM) NAIL INTERTROCHANTRIC, RIGHT  Antimicrobials:    Interval history/Subjective: Seen post-op. Per RN was hypoxic post-op, SpO2 85% on arrival back to room.  Feels fine, no pain. Breathing fine.  Objective: Vitals:  Vitals:   11/28/2017 1435 11/22/2017 1445  BP: (!) 100/53   Pulse: (!) 104 (!) 105  Resp: 16 17  Temp:  97.7 F (36.5 C)  SpO2: (!) 88% (!) 86%    Exam:  Constitutional:  . Appears calm and comfortable, non-toxic Eyes:  . pupils and irises appear normal ENMT:  . grossly normal hearing  . Lips appear normal Respiratory:   . Decreased breath sounds bilaterally, poor air movement, no w/r/r.  . Respiratory effort normal. Cardiovascular:  . RRR, no m/r/g . No LE extremity edema   Psychiatric:  . Mental status o Mood, affect appropriate  I have personally reviewed the following:   Labs:  Sodium 127 >> 126  Creatinine 1.6 >> 1.57 (1.16 on 10/20/2017)  Hgb 9.2 >. 9.4 (baseline)  Imaging studies:  CXR NAD  Medical tests:  EKG SR poor quality   Scheduled Meds: . acetaminophen  650 mg Oral Q8H  . budesonide  0.25 mg Inhalation BID  . cholecalciferol  1,000 Units Oral Daily  . docusate sodium  100 mg Oral BID  . [START ON 12/07/2017] enoxaparin (LOVENOX) injection  40 mg Subcutaneous Q24H  . feeding supplement (ENSURE ENLIVE)  237 mL Oral Q24H  . flecainide  50 mg Oral BID  . levalbuterol  1.25 mg Nebulization TID  . metoprolol tartrate  50 mg Oral Q6H  . multivitamin with minerals  1 tablet Oral Daily  . simvastatin  20 mg Oral q1800  . umeclidinium-vilanterol  1 puff Inhalation Daily   Continuous Infusions: . sodium chloride 100 mL/hr at 11/29/2017 6222    Principal Problem:   Closed hip fracture, right, initial encounter [facility]) Active Problems:   SVT (supraventricular tachycardia) (HCC)   COPD GOLD III/ emphysematous predominantly    Essential hypertension   Hyperlipidemia   Depression with anxiety   Fall   Hyponatremia   Closed displaced intertrochanteric fracture of right femur (HCC)   Acute renal failure superimposed on stage 3 chronic kidney disease ([place])   LOS: 1 day

## 2017-12-06 NOTE — Interval H&P Note (Signed)
History and Physical Interval Note:  11/22/2017 11:22 AM  Carol Carson  has presented today for surgery, with the diagnosis of RIGHT HIP FRACTURE  The various methods of treatment have been discussed with the patient and family. After consideration of risks, benefits and other options for treatment, the patient has consented to  Procedure(s): INTRAMEDULLARY (IM) NAIL INTERTROCHANTRIC, RIGHT (Right) as a surgical intervention .  The patient's history has been reviewed, patient examined, no change in status, stable for surgery.  I have reviewed the patient's chart and labs.  Questions were answered to the patient's satisfaction.     Tarah Buboltz D

## 2017-12-06 NOTE — Consult Note (Signed)
ORTHOPAEDIC CONSULTATION  REQUESTING PHYSICIAN: Samuella Cota, MD  Chief Complaint: Right hip pain, mechanical fall  Assessment / Plan: Principal Problem:   Closed hip fracture, right, initial encounter [facility]) Active Problems:   SVT (supraventricular tachycardia) (HCC)   COPD GOLD III/ emphysematous predominantly    Essential hypertension   Hyperlipidemia   Depression with anxiety   Fall   Hyponatremia   Closed right hip fracture (HCC)   Acute renal failure superimposed on stage 3 chronic kidney disease ([place])  Displaced right intertrochanteric femur fracture Plan for Operative fixation today -NPO -Medicine team to admit and perform pre-op clearance -Type and Cross for 2 units held for OR -PT/OT post op -Will ammend WB status postop, bedrest for now -Foley for comfort to be removed POD 1/2 -Likely to require Rehab or SNF placement upon discharge.  -VTE prophylaxis: SCDs.  We will amend postoperatively, likely Lovenox for 30 days  HPI: Carol Carson is a 76 y.o. female who complains of right hip pain after fall at home.  She slipped and denies lightheadedness, dizziness, or syncope.  She fell directly onto her right hip feeling immediate pain and inability to bear weight.  She presented to the emergency department where CT scan showed mildly displaced intertrochanteric fracture of the right hip with a varus deformity and mild impaction. No femoral neck fracture is identified.  Orthopedics was consulted for evaluation  Today her pain is under control when she lies still.  Last meal yesterday. She denies history of MI, CVA, DVT, PE.  She was recently hospitalized for acute on chronic respiratory failure, hypoxia and hypercapnia/COPD. Previously ambulatory without aid.  The patient was living in her home.  She does have a son that lives in town.  Past Medical History:  Diagnosis Date  . Chronic renal insufficiency   . COPD (chronic obstructive pulmonary disease)  ([place])   . Diverticulosis   . DJD (degenerative joint disease)   . Endometrial cancer ([place])   . Hyperlipidemia   . Hypertension   . IBS (irritable bowel syndrome)   . RLS (restless legs syndrome)   . SVT (supraventricular tachycardia) ([place])   . Uterine cancer [facility])    Past Surgical History:  Procedure Laterality Date  . ABDOMINAL HYSTERECTOMY     d/t unterine cancer///no chemo/no radiation  . LUNG SURGERY  2002   golf ball sized tumor, left lung---spot vanished    Social History   Socioeconomic History  . Marital status: Widowed    Spouse name: None  . Number of children: None  . Years of education: None  . Highest education level: None  Social Needs  . Financial resource strain: None  . Food insecurity - worry: None  . Food insecurity - inability: None  . Transportation needs - medical: None  . Transportation needs - non-medical: None  Occupational History  . Occupation: retired  Tobacco Use  . Smoking status: Former Smoker    Packs/day: 1.30    Years: 43.00    Pack years: 55.90    Types: Cigarettes    Last attempt to quit: 12/08/1998    Years since quitting: 19.0  . Smokeless tobacco: Never Used  Substance and Sexual Activity  . Alcohol use: No    Alcohol/week: 0.0 oz  . Drug use: No  . Sexual activity: None  Other Topics Concern  . None  Social History Narrative   Lives alone in a one story home.  Has 1 son.  Has not worked since the 60's.     Education: high school.   Family History  Problem Relation Age of Onset  . Uterine cancer Mother   . Brain cancer Father   . Parkinson's disease Brother   . Aneurysm Sister        Brain aneusrym  . Stroke Sister   . Healthy Son   . Breast cancer Neg Hx    Allergies  Allergen Reactions  . Contrast Media [Iodinated Diagnostic Agents] Other (See Comments)    LOW KIDNEY FUNCTION///NEED TO CHECK WITH NEPHROLOGIST BEFORE PROCEDURE.  . Biaxin [Clarithromycin] Hives  . Penicillins Hives    Has patient had a PCN  reaction causing immediate rash, facial/tongue/throat swelling, SOB or lightheadedness with hypotension: Yes Has patient had a PCN reaction causing severe rash involving mucus membranes or skin necrosis: No Has patient had a PCN reaction that required hospitalization: No Has patient had a PCN reaction occurring within the last 10 years: No If all of the above answers are "NO", then may proceed with Cephalosporin use.  . Sulfa Antibiotics Rash   Prior to Admission medications   Medication Sig Start Date End Date Taking? Authorizing Provider  ALPRAZolam (XANAX) 0.25 MG tablet Take 0.25 mg by mouth 2 (two) times daily as needed for anxiety or sleep.  02/08/15  Yes [provider]  B Complex-C (SUPER B COMPLEX PO) Take 1 tablet daily by mouth.   Yes [provider]  cholecalciferol (VITAMIN D) 1000 UNITS tablet Take 1,000 Units by mouth daily.   Yes [provider]  feeding supplement, ENSURE ENLIVE, (ENSURE ENLIVE) LIQD Take 237 mLs daily by mouth. 10/20/17  Yes Regalado, Belkys A, MD  ferrous sulfate 325 (65 FE) MG tablet Take 325 mg by mouth daily as needed (for iron).    Yes [provider]  flecainide (TAMBOCOR) 50 MG tablet Take 1 tablet (50 mg total) by mouth 2 (two) times daily. 07/29/17  Yes Camnitz, Will Hassell Done, MD  levalbuterol [facility] HFA) 45 MCG/ACT inhaler Inhale 2 puffs into the lungs every 4 (four) hours as needed for wheezing or shortness of breath.   Yes [provider]  metoprolol tartrate (LOPRESSOR) 50 MG tablet Take 1 tablet (50 mg total) every 6 (six) hours by mouth. 10/20/17  Yes Regalado, Belkys A, MD  Mometasone Furoate (ASMANEX HFA) 100 MCG/ACT AERO Inhale 2 puffs into the lungs 2 (two) times daily. 11/24/17  Yes Brand Males, MD  Multiple Vitamin (MULTIVITAMIN WITH MINERALS) TABS tablet Take 1 tablet daily by mouth. Centrum   Yes [provider]  simvastatin (ZOCOR) 20 MG tablet TAKE 1 TABLET (20 MG) BY MOUTH IN THE  EVENING 07/31/17  Yes [provider]  umeclidinium-vilanterol (ANORO ELLIPTA) 62.5-25 MCG/INH AEPB Inhale 1 puff into the lungs daily. 07/17/17  Yes Brand Males, MD  OXYGEN Inhale 2-4 L See admin instructions into the lungs. 2lpm with rest, 4 with exertion and 3 lpm with sleep    [provider]  phosphorus (K PHOS NEUTRAL) 155-852-130 MG tablet Take 2 tablets (500 mg total) 2 (two) times daily by mouth. Patient not taking: Reported on 11/24/2017 10/20/17   Regalado, Jerald Kief A, MD  rOPINIRole (REQUIP) 0.25 MG tablet Take 1 tablet (0.25 mg total) by mouth 2 (two) times daily. Patient not taking: Reported on 11/24/2017 04/14/17   Alda Berthold, DO   Dg Chest 1 View  Result Date: 12/03/2017 CLINICAL DATA:  Right hip fracture EXAM: CHEST 1 VIEW COMPARISON:  10/15/2017 chest radiograph. FINDINGS: Decubitus views were obtained. Stable cardiomediastinal silhouette with normal heart size and aortic atherosclerosis. No pneumothorax. No pleural effusion. Peribronchial cuffing throughout both lungs is unchanged. No acute consolidative airspace disease or pulmonary edema. Relative hypoinflation of the left lung is probably due to technique. IMPRESSION: No acute cardiopulmonary disease. Stable chronic diffuse peribronchial cuffing suggesting chronic bronchitis. Electronically Signed   By: Ilona Sorrel M.D.   On: 11/24/2017 18:26   Ct Hip Right Wo Contrast  Result Date: 11/20/2017 CLINICAL DATA:  [place].  Right hip fracture. EXAM: CT OF THE RIGHT HIP WITHOUT CONTRAST TECHNIQUE: Multidetector CT imaging of the right hip was performed according to the standard protocol. Multiplanar CT image reconstructions were also generated. COMPARISON:  Radiographs 11/23/2017 FINDINGS: There is a mildly displaced intertrochanteric fracture of the right hip with a varus deformity and mild impaction. No femoral neck fracture is identified. Both hips are normally located. Moderate degenerative changes  bilaterally. Remote healed left pubic rami fractures. No new/ acute right-sided pelvic fractures. No significant intrapelvic abnormalities. IMPRESSION: Mildly displaced intertrochanteric fracture of the right hip. Electronically Signed   By: Marijo Sanes M.D.   On: 11/17/2017 20:47   Dg Hip Unilat W Or Wo Pelvis 2-3 Views Right  Result Date: 11/21/2017 CLINICAL DATA:  76 year old female with fall and right hip pain. EXAM: DG HIP (WITH OR WITHOUT PELVIS) 2-3V RIGHT COMPARISON:  None. FINDINGS: There is a minimally displaced fracture of the right femoral neck with intertrochanteric extension. There is varus angulation of the femoral shaft. There is no dislocation. The bones are osteopenic. There is no dislocation. There is degenerative changes of the lower lumbar spine. The soft tissues appear unremarkable. IMPRESSION: Mildly displaced fracture of the right femoral neck. No dislocation. Electronically Signed   By: Anner Crete M.D.   On: 11/23/2017 18:11    Positive ROS: All other systems have been reviewed and were otherwise negative with the exception of those mentioned in the HPI and as above.  Objective: Labs cbc Recent Labs    11/16/2017 2001 11/12/2017 0322  WBC 10.7* 8.9  HGB 9.2* 9.4*  HCT 29.0* 30.8*  PLT 266 277    Labs inflam No results for input(s): CRP in the last 72 hours.  Invalid input(s): ESR  Labs coag Recent Labs    11/25/2017 2001  INR 1.05    Recent Labs    12/04/2017 2001 11/29/2017 0322  NA 127* 126*  K 4.1 4.5  CL 81* 81*  CO2 33* 35*  GLUCOSE 105* 129*  BUN 18 16  CREATININE 1.60* 1.57*  CALCIUM 8.6* 8.3*    Physical Exam: Vitals:   12/07/2017 2200 11/07/2017 2248  BP: (!) 141/62 (!) 153/62  Pulse: 87 94  Resp: 17   Temp:  98 F (36.7 C)  SpO2: 93% 90%   General: Alert, no acute distress Mental status: Alert and Oriented x3 Neurologic: Speech Clear and organized, no gross focal findings or movement disorder appreciated. Respiratory: No  cyanosis, no use of accessory musculature Cardiovascular: No pedal edema GI: Abdomen is soft and non-tender, non-distended. Skin: Warm and dry.  No lesions in the area of chief complaint. Extremities: Warm and well perfused w/o edema Psychiatric: Patient is competent for consent with normal mood and affect  MUSCULOSKELETAL:  Right hip pain with range of motion.  No lesions in the area of chief complaint.  Neurovascular intact distally.  EHL FHL dorsiflexion plantarflexion intact. Other extremities are atraumatic with painless ROM and NVI.  Prudencio Burly III PA-C 12/07/2017 7:16 AM

## 2017-12-06 NOTE — Progress Notes (Signed)
Pt would not hold or keep tx on her face.  She seems very confused and altered at this time.  RT will make RN aware and continue to monitor.

## 2017-12-06 NOTE — Transfer of Care (Signed)
Immediate Anesthesia Transfer of Care Note  Patient: Carol Carson  Procedure(s) Performed: INTRAMEDULLARY (IM) NAIL INTERTROCHANTRIC, RIGHT (Right )  Patient Location: PACU  Anesthesia Type:General  Level of Consciousness: awake, alert , oriented and patient cooperative  Airway & Oxygen Therapy: Patient Spontanous Breathing and Patient connected to face mask oxygen  Post-op Assessment: Report given to RN, Post -op Vital signs reviewed and stable and Patient moving all extremities  Post vital signs: Reviewed and stable  Last Vitals:  Vitals:   11/20/2017 0700 12/04/2017 1045  BP: (!) 133/52 132/70  Pulse: 90 90  Resp:  18  Temp: 36.8 C 36.8 C  SpO2: 90% 90%    Last Pain:  Vitals:   11/14/2017 1045  TempSrc: Oral  PainSc:          Complications: No apparent anesthesia complications

## 2017-12-06 NOTE — Plan of Care (Signed)
Patient voices understanding of the pain scale and calls for medication when needed

## 2017-12-06 NOTE — Op Note (Signed)
DATE OF SURGERY:  11/10/2017  TIME: 12:07 PM  PATIENT NAME:  Carol Carson  AGE: 76 y.o.  PRE-OPERATIVE DIAGNOSIS:  RIGHT HIP FRACTURE  POST-OPERATIVE DIAGNOSIS:  SAME  PROCEDURE:  INTRAMEDULLARY (IM) NAIL INTERTROCHANTRIC, RIGHT  SURGEON:  Midas Daughety D  ASSISTANT:  Roxan Hockey, PA-C, he was present and scrubbed throughout the case, critical for completion in a timely fashion, and for retraction, instrumentation, and closure.   OPERATIVE IMPLANTS: [employer] Gamma Nail  PREOPERATIVE INDICATIONS:  [name] is a 76 y.o. year old who fell and suffered a hip fracture. She was brought into the ER and then admitted and optimized and then elected for surgical intervention.    The risks benefits and alternatives were discussed with the patient including but not limited to the risks of nonoperative treatment, versus surgical intervention including infection, bleeding, nerve injury, malunion, nonunion, hardware prominence, hardware failure, need for hardware removal, blood clots, cardiopulmonary complications, morbidity, mortality, among others, and they were willing to proceed.    OPERATIVE PROCEDURE:  The patient was brought to the operating room and placed in the supine position. General anesthesia was administered. She was placed on the fracture table.  Closed reduction was performed under C-arm guidance. Time out was then performed after sterile prep and drape. She received preoperative antibiotics.  Incision was made proximal to the greater trochanter. A guidewire was placed in the appropriate position. Confirmation was made on AP and lateral views. The above-named nail was opened. I opened the proximal femur with a reamer. I then placed the nail by hand easily down. I did not need to ream the femur.  Once the nail was completely seated, I placed a guidepin into the femoral head into the center center position. I measured the length, and then reamed the lateral cortex and up  into the head. I then placed the lag screw. Slight compression was applied. Anatomic fixation achieved. Bone quality was mediocre.  I then secured the proximal interlocking bolt, and took off a half a turn, and then removed the instruments, and took final C-arm pictures AP and lateral the entire length of the leg.   Anatomic reconstruction was achieved, and the wounds were irrigated copiously and closed with Vicryl followed by staples and sterile gauze for the skin. The patient was awakened and returned to PACU in stable and satisfactory condition. There no complications and the patient tolerated the procedure well.  She will be weightbearing as tolerated, and will be on chemical px  for a period of four weeks after discharge.   Edmonia Lynch, M.D.

## 2017-12-07 ENCOUNTER — Telehealth: Payer: Self-pay | Admitting: Internal Medicine

## 2017-12-07 DIAGNOSIS — E440 Moderate protein-calorie malnutrition: Secondary | ICD-10-CM

## 2017-12-07 LAB — BASIC METABOLIC PANEL
Anion gap: 12 (ref 5–15)
BUN: 19 mg/dL (ref 6–20)
CHLORIDE: 84 mmol/L — AB (ref 101–111)
CO2: 33 mmol/L — AB (ref 22–32)
CREATININE: 1.54 mg/dL — AB (ref 0.44–1.00)
Calcium: 8.1 mg/dL — ABNORMAL LOW (ref 8.9–10.3)
GFR calc non Af Amer: 31 mL/min — ABNORMAL LOW (ref >60)
GFR, EST AFRICAN AMERICAN: 36 mL/min — AB (ref >60)
Glucose, Bld: 153 mg/dL — ABNORMAL HIGH (ref 65–99)
POTASSIUM: 5.1 mmol/L (ref 3.5–5.1)
Sodium: 129 mmol/L — ABNORMAL LOW (ref 135–145)

## 2017-12-07 MED ORDER — LEVALBUTEROL HCL 1.25 MG/0.5ML IN NEBU: 1.2500 mg | INHALATION_SOLUTION | Freq: Four times a day (QID) | RESPIRATORY_TRACT | Status: DC | PRN

## 2017-12-07 NOTE — Telephone Encounter (Signed)
lmtcb x2 for Carol Carson with [facility] lmtcb x1 for symptoms.

## 2017-12-07 NOTE — Telephone Encounter (Signed)
Left message with Beth with [facility] regarding call back. Pt is in [facility] currently. Please see phone note from today 12/07/2017

## 2017-12-07 NOTE — Progress Notes (Signed)
Initial Nutrition Assessment  DOCUMENTATION CODES:   Non-severe (moderate) malnutrition in context of chronic illness  INTERVENTION:    Continue Ensure Enlive po BID, each supplement provides 350 kcal and 20 grams of protein  NUTRITION DIAGNOSIS:   Moderate Malnutrition related to chronic illness(COPD, CKD) as evidenced by moderate fat depletion, moderate muscle depletion, percent weight loss (6% x 1.5 month)  GOAL:   Patient will meet greater than or equal to 90% of their needs  MONITOR:   PO intake, Supplement acceptance, Weight trends, Skin, Other (Comment), Labs  REASON FOR ASSESSMENT:   Consult Assessment of nutrition requirement/status  ASSESSMENT:   76 yo Female with PMH significant of hypertension, hyperlipidemia, COPD on 2 L oxygen at home, depression with anxiety, remote uterine cancer in remission, SVT, CKD-3, iron deficiency anemia, IBS, who presents with fall and right hip pain.   S/P IM nailing on 12/30  Pt slightly confused. Getting frustrated with RD interview questions. Does report her appetite isn't good. Breakfast meal untouched on tray table. Per readings below, pt has had a 6% weight loss since 10/2017. Significant for time frame.  Medications include Vitamin D, MVI and Miralax. Labs reviewed. Na 129 (L). CBG's [id number].  NUTRITION - FOCUSED PHYSICAL EXAM:    Most Recent Value  Orbital Region  Unable to assess  Upper Arm Region  Moderate depletion  Thoracic and Lumbar Region  Unable to assess  Buccal Region  Unable to assess  Temple Region  Moderate depletion  Clavicle Bone Region  Moderate depletion  Clavicle and Acromion Bone Region  Moderate depletion  Scapular Bone Region  Unable to assess  Dorsal Hand  Unable to assess  Patellar Region  Moderate depletion  Anterior Thigh Region  Moderate depletion  Posterior Calf Region  Moderate depletion  Edema (RD Assessment)  None     Diet Order:  Diet regular Room service appropriate? Yes;  Fluid consistency: Thin  EDUCATION NEEDS:   No education needs have been identified at this time  Skin:  Skin Assessment: Reviewed RN Assessment  Last BM:  12/29  Height:   Ht Readings from Last 1 Encounters:  12/07/17 5\' 3"  (1.6 m)   Weight:   Wt Readings from Last 1 Encounters:  12/07/17 107 lb (48.5 kg)   Wt Readings from Last 10 Encounters:  12/07/17 107 lb (48.5 kg)  11/24/17 107 lb 9.6 oz (48.8 kg)  10/20/17 114 lb 6.4 oz (51.9 kg)  09/01/17 115 lb 12.8 oz (52.5 kg)  07/15/17 115 lb 9.6 oz (52.4 kg)  07/07/17 117 lb (53.1 kg)  06/30/17 115 lb 12.8 oz (52.5 kg)  05/26/17 115 lb (52.2 kg)  05/06/17 115 lb (52.2 kg)  01/30/17 120 lb (54.4 kg)   Ideal Body Weight:  52.2 kg  BMI:  Body mass index is 18.95 kg/m.  Estimated Nutritional Needs:   Kcal:  1300-1500  Protein:  60-75 gm  Fluid:  >/= 1.5 L  Arthur Holms, RD, LDN Pager #: [phone] After-Hours Pager #: [phone]

## 2017-12-07 NOTE — Progress Notes (Signed)
  PROGRESS NOTE  Carol Carson FBP:[id number] DOB: 29-Sep-1941 DOA: 11/26/2017 PCP: Leeroy Cha, MD  Brief Narrative: 64yow PMH COPD presented with right hip pain s/p fall at home. Admitted for right hip fracture.  Assessment/Plan Closed right hip fx s/p mechanical fall at home. - s/p IM nail 12/30 - continue management per orthopedics  COPD, chronic hypoxic resp failure on home oxygen 2LNC - requiring 4LNC but appears stable with sats in mid 90's - wean to 2 L home requirement - continue supplemental oxygen, continuous pulse ox, bronchodilators 12/31  AKI superimposed on suspected CKD stage III. Suspect dehydration - no change in renal fxn - strict I/O, check u/a, continue IVF, recheck in AM  Hypo-osmolar hyponatremia; hypochloridemia. TSH slightly high at 5.070. Likely secondary to dehydration - sodium slowly trending up, remains completely asymptomatic - continue IVF, check BMP in AM - repeat TSH as an outpatient  Normocytic anemia, stable since 10/2017  PMH SVT - stable, continue metoprolol, flecainide 12/31  Moderate malnutrition - supplements per dietician  DVT prophylaxis: enoxaparin Code Status: full Family Communication: none  Disposition Plan: SNF   Murray Hodgkins, MD  [facility] Direct contact: [phone] --Via amion app OR  --[email or web address]; password [email or web address]  7PM-7AM contact night coverage as above 12/07/2017, 3:01 PM  LOS: 2 days   Consultants:  Orthopedics   Procedures:  12/30 INTRAMEDULLARY (IM) NAIL INTERTROCHANTRIC, RIGHT  Antimicrobials:    Interval history/Subjective: Was confused last night.  Feels ok today, breathing fair.  Objective: Vitals:  Vitals:   12/07/17 0817 12/07/17 0818  BP:    Pulse:    Resp:    Temp:    SpO2: 92% 94%    Exam:  Constitutional:   . Appears calm, mildly uncomfortable Respiratory:  . Poor air movement, no w/r/r.  . Respiratory effort mildly increased Cardiovascular:   . RRR, no m/r/g Psychiatric:  . Mental status o Mood, affect appropriate  I have personally reviewed the following:   Labs:  Sodium 127 >> 126 >> 129  Creatinine 1.6 >> 1.57 >> 1.54 (1.16 on 10/20/2017)  Hgb 9.2 >. 9.4 >> none today    Scheduled Meds: . budesonide  0.25 mg Inhalation BID  . cholecalciferol  1,000 Units Oral Daily  . enoxaparin (LOVENOX) injection  40 mg Subcutaneous Q24H  . feeding supplement (ENSURE ENLIVE)  237 mL Oral Q24H  . flecainide  50 mg Oral BID  . metoprolol tartrate  50 mg Oral Q6H  . multivitamin with minerals  1 tablet Oral Daily  . polyethylene glycol  17 g Oral Daily  . simvastatin  20 mg Oral q1800  . umeclidinium-vilanterol  1 puff Inhalation Daily   Continuous Infusions: . sodium chloride Stopped (12/07/17 1000)    Principal Problem:   Closed hip fracture, right, initial encounter ([place]) Active Problems:   SVT (supraventricular tachycardia) (HCC)   COPD GOLD III/ emphysematous predominantly    Essential hypertension   COPD (chronic obstructive pulmonary disease) (HCC)   Fall   Hyponatremia   Closed displaced intertrochanteric fracture of right femur (HCC)   Acute renal failure superimposed on stage 3 chronic kidney disease (HCC)   Chronic respiratory failure with hypoxia (HCC)   Malnutrition of moderate degree   LOS: 2 days

## 2017-12-07 NOTE — Progress Notes (Signed)
   Assessment / Plan: 1 Day Post-Op  S/P Procedure(s) (LRB): INTRAMEDULLARY (IM) NAIL INTERTROCHANTRIC, RIGHT (Right) by Dr. Ernesta Amble. Percell Miller on 11/27/2017  Principal Problem:   Closed hip fracture, right, initial encounter [facility]) Active Problems:   SVT (supraventricular tachycardia) (HCC)   COPD GOLD III/ emphysematous predominantly    Essential hypertension   COPD (chronic obstructive pulmonary disease) ([place])   Fall   Hyponatremia   Closed displaced intertrochanteric fracture of right femur (HCC)   Acute renal failure superimposed on stage 3 chronic kidney disease (HCC)   Chronic respiratory failure with hypoxia (HCC)    Closed right intertrochanteric femur fracture Doing well POD 1 status post IM nail Soreness mild.  Not yet up with therapy.  Up with therapy Incentive Spirometry Apply ice  Weight Bearing: Weight Bearing as Tolerated (WBAT) RLE Dressings: Maintain Mepilex.  VTE prophylaxis: Lovenox, SCDs, ambulation Dispo: Primary.  Therapy evaluations pending.  Likely will need SNF -she lives alone.  Subjective: Patient reports pain as mild.  Tolerating diet.   No CP, SOB.  Not yet OOB.  Objective:   VITALS:   Vitals:   11/07/2017 1600 11/10/2017 2034 11/09/2017 2051 12/07/17 0443  BP:  (!) 125/52  134/60  Pulse:  90  95  Resp:  16  16  Temp:  (!) 97.5 F (36.4 C)  98.5 F (36.9 C)  TempSrc:  Oral  Oral  SpO2: 90% 100% 92% 94%   CBC Latest Ref Rng & Units 11/21/2017 11/10/2017 12/02/2017  WBC 4.0 - 10.5 K/uL 13.2(H) 8.9 10.7(H)  Hemoglobin 12.0 - 15.0 g/dL 9.1(L) 9.4(L) 9.2(L)  Hematocrit 36.0 - 46.0 % 30.5(L) 30.8(L) 29.0(L)  Platelets 150 - 400 K/uL 275 277 266   BMP Latest Ref Rng & Units 11/24/2017 11/20/2017 11/12/2017  Glucose 65 - 99 mg/dL 142(H) 129(H) 105(H)  BUN 6 - 20 mg/dL 16 16 18   Creatinine 0.44 - 1.00 mg/dL 1.44(H) 1.57(H) 1.60(H)  Sodium 135 - 145 mmol/L 128(L) 126(L) 127(L)  Potassium 3.5 - 5.1 mmol/L 4.6 4.5 4.1  Chloride 101 - 111  mmol/L 87(L) 81(L) 81(L)  CO2 22 - 32 mmol/L 32 35(H) 33(H)  Calcium 8.9 - 10.3 mg/dL 7.9(L) 8.3(L) 8.6(L)   Intake/Output      12/30 0701 - 12/31 0700 12/31 0701 - 01/01 0700   P.O. 240    I.V. 3203.3    Total Intake 3443.3    Urine 150    Blood 100    Total Output 250    Net +3193.3           Physical Exam: General: NAD.  Supine in bed.  Calm, conversant.  Answers questions appropriately.  No increased work of breathing.  [place] in place  MSK Neurovascularly intact Sensation intact distally Feet warm Dorsiflexion/Plantar flexion intact Incision: dressing C/D/I.  Very scant dry drainage incision.   Charna Elizabeth Martensen III, PA-C 12/07/2017, 7:03 AM

## 2017-12-07 NOTE — Evaluation (Signed)
Physical Therapy Evaluation Patient Details Name: Carol Carson MRN: [id number] DOB: [birth year] Today's Date: 12/07/2017   History of Present Illness  Pt is a 76 y/o female  with medical history significant ofhypertension, hyperlipidemia, COPD on 2 L oxygen at home, depression with anxiety, remote uterinecancer in remission, SVT, CKD-3, iron deficiency anemia, IBS, who presents with fall and right hip pain. She was found to have a R hip fracture and is now s/p IM nailing on 11/14/2017.   Clinical Impression  Pt admitted with above diagnosis. Pt currently with functional limitations due to the deficits listed below (see PT Problem List). At the time of PT eval pt was able to perform transfers with +1 max to total assist for all aspects of mobility. Anticipate we will be able to progress mobility next session with +2 assistance. At this time, SNF is the safest and most appropriate d/c disposition. Pt will benefit from skilled PT to increase their independence and safety with mobility to allow discharge to the venue listed below.       Follow Up Recommendations SNF;Supervision/Assistance - 24 hour    Equipment Recommendations  Rolling walker with 5" wheels;3in1 (PT)    Recommendations for Other Services       Precautions / Restrictions Precautions Precautions: Fall Restrictions Weight Bearing Restrictions: Yes RLE Weight Bearing: Weight bearing as tolerated      Mobility  Bed Mobility Overal bed mobility: Needs Assistance Bed Mobility: Supine to Sit;Sit to Supine     Supine to sit: Max assist;HOB elevated Sit to supine: Total assist   General bed mobility comments: VC's for sequencing and general safety. Pt was able to transition to EOB with max assist from therapist and bed pad to scoot hips around. Total assist required for return to supine and repositioning in bed.   Transfers Overall transfer level: Needs assistance Equipment used: Rolling walker (2 wheeled) Transfers:  Sit to/from Stand Sit to Stand: Total assist         General transfer comment: Unable to complete full stand at EOB. Attempted x2.   Ambulation/Gait                Stairs            Wheelchair Mobility    Modified Rankin (Stroke Patients Only)       Balance Overall balance assessment: Needs assistance Sitting-balance support: Feet supported;Bilateral upper extremity supported Sitting balance-Leahy Scale: Poor Sitting balance - Comments: Requires UE support to maintain sitting balance   Standing balance support: Bilateral upper extremity supported Standing balance-Leahy Scale: Zero Standing balance comment: Total assist required                             Pertinent Vitals/Pain Pain Assessment: Faces Faces Pain Scale: Hurts whole lot Pain Location: RLE Pain Descriptors / Indicators: Operative site guarding;Grimacing Pain Intervention(s): Limited activity within patient's tolerance;Monitored during session;Repositioned    Home Living Family/patient expects to be discharged to:: Private residence Living Arrangements: Alone Available Help at Discharge: Family;Available 24 hours/day(If needed) Type of Home: House Home Access: Level entry     Home Layout: One level Home Equipment: None Additional Comments: Pt states she has some equipment available at home but is unable to name or describe the equipment    Prior Function Level of Independence: Needs assistance      ADL's / Homemaking Assistance Needed: Family comes in every other day to assist in getting showered and  dressed. States she does not do her own housework "family sort of helps". Was cooking, but not able to drive  Comments: Pt is confused and unsure if information provided for home living and PLOF are accurate.      Hand Dominance   Dominant Hand: Right    Extremity/Trunk Assessment   Upper Extremity Assessment Upper Extremity Assessment: Defer to OT evaluation    Lower  Extremity Assessment Lower Extremity Assessment: RLE deficits/detail RLE Deficits / Details: Acute pain, decreased strength and AROM consistent with above mentioned procedure.  RLE: Unable to fully assess due to pain    Cervical / Trunk Assessment Cervical / Trunk Assessment: Kyphotic  Communication   Communication: Expressive difficulties  Cognition Arousal/Alertness: Lethargic Behavior During Therapy: Flat affect Overall Cognitive Status: Impaired/Different from baseline Area of Impairment: Orientation;Attention;Memory;Following commands;Safety/judgement;Awareness;Problem solving                 Orientation Level: Disoriented to;Time;Situation Current Attention Level: Focused Memory: Decreased short-term memory Following Commands: Follows one step commands inconsistently;Follows one step commands with increased time Safety/Judgement: Decreased awareness of safety;Decreased awareness of deficits Awareness: Intellectual Problem Solving: Requires verbal cues;Difficulty sequencing;Slow processing        General Comments      Exercises     Assessment/Plan    PT Assessment Patient needs continued PT services  PT Problem List Decreased strength;Decreased activity tolerance;Decreased range of motion;Decreased balance;Decreased mobility;Decreased cognition;Decreased knowledge of use of DME;Decreased knowledge of precautions;Decreased safety awareness;Pain       PT Treatment Interventions DME instruction;Gait training;Stair training;Functional mobility training;Therapeutic activities;Therapeutic exercise;Neuromuscular re-education;Patient/family education    PT Goals (Current goals can be found in the Care Plan section)  Acute Rehab PT Goals Patient Stated Goal: None stated PT Goal Formulation: Patient unable to participate in goal setting Time For Goal Achievement: 12/21/17 Potential to Achieve Goals: Good    Frequency Min 3X/week   Barriers to discharge Decreased  caregiver support Lives alone - states family can be there 24 hours however unsure if this is accurate as she is confused at this time    Co-evaluation               AM-PAC PT "6 Clicks" Daily Activity  Outcome Measure Difficulty turning over in bed (including adjusting bedclothes, sheets and blankets)?: Unable Difficulty moving from lying on back to sitting on the side of the bed? : Unable Difficulty sitting down on and standing up from a chair with arms (e.g., wheelchair, bedside commode, etc,.)?: Unable Help needed moving to and from a bed to chair (including a wheelchair)?: Total Help needed walking in hospital room?: Total Help needed climbing 3-5 steps with a railing? : Total 6 Click Score: 6    End of Session Equipment Utilized During Treatment: Gait belt;Oxygen Activity Tolerance: Patient limited by pain;Patient limited by fatigue Patient left: in bed;with call bell/phone within reach;with bed alarm set Nurse Communication: Mobility status PT Visit Diagnosis: Unsteadiness on feet (R26.81);Pain;Difficulty in walking, not elsewhere classified (R26.2) Pain - Right/Left: Right Pain - part of body: Hip    Time: 7829-5621 PT Time Calculation (min) (ACUTE ONLY): 33 min   Charges:   PT Evaluation $PT Eval Moderate Complexity: 1 Mod PT Treatments $Gait Training: 8-22 mins   PT G Codes:        Rolinda Roan, PT, DPT Acute Rehabilitation Services Pager: [phone]   Carol Carson 12/07/2017, 9:07 AM

## 2017-12-07 NOTE — Telephone Encounter (Signed)
Duplicate message. 

## 2017-12-07 NOTE — Progress Notes (Signed)
Pt refused her 2200 medicines and BP medicine lopressor at 0000, pt also pulled her IV out this morning, will continue to monitor.

## 2017-12-07 NOTE — Evaluation (Signed)
Occupational Therapy Evaluation Patient Details Name: Carol Carson MRN: [id number] DOB: June 01, 1941 Today's Date: 12/07/2017    History of Present Illness Pt is a 76 y/o female  with medical history significant ofhypertension, hyperlipidemia, COPD on 2 L oxygen at home, depression with anxiety, remote uterinecancer in remission, SVT, CKD-3, iron deficiency anemia, IBS, who presents with fall and right hip pain. She was found to have a R hip fracture and is now s/p IM nailing on 12/04/2017.    Clinical Impression   Pt admitted with problem listed above.  Per chart review and pt report, she lives at home PTA.  Pt very agitated and presenting with cognitive impairment during session.  She was unable to provide home living information to OT (information copied from PT report). Attempted bed mobility during session in order to sit EOB to participate in ADLs.  Pt able to initiate movement toward EOB but then became increasingly agitated and yelling until returned to supine position. At this time, recommend SNF for discharge planning.  Will continue to follow acutely in order to maximize safety and independence with ADLs.     Follow Up Recommendations  SNF;Supervision/Assistance - 24 hour    Equipment Recommendations  (TBD)    Recommendations for Other Services       Precautions / Restrictions Precautions Precautions: Fall Restrictions Weight Bearing Restrictions: Yes RLE Weight Bearing: Weight bearing as tolerated      Mobility Bed Mobility         General bed mobility comments: Unable to complete full supine to sit transfer.  Transfers          General transfer comment: Unable to assess.    Balance                           ADL either performed or assessed with clinical judgement   ADL Overall ADL's : Needs assistance/impaired     Grooming: Wash/dry face;Wash/dry hands;Bed level;Moderate assistance   Upper Body Bathing: Total assistance;Bed level    Lower Body Bathing: Total assistance;Bed level   Upper Body Dressing : Total assistance   Lower Body Dressing: Total assistance;Bed level                 General ADL Comments: Did not attempt OOB mobility due to pt agitation.  Therapist attempted to facilitate bed mobility in order to perform EOB sitting. Pt became increasingly agitated with movement and yelling for help.  Was able to shift towards EOB and began propping up on elbows to slide toward EOB.  Therapist assisted her with return to supine as she became more agitated.      Vision   Vision Assessment?: No apparent visual deficits     Perception     Praxis      Pertinent Vitals/Pain Pain Assessment: Faces Faces Pain Scale: Hurts even more Pain Location: RLE Pain Descriptors / Indicators: Operative site guarding;Grimacing Pain Intervention(s): Monitored during session     Hand Dominance Right   Extremity/Trunk Assessment Upper Extremity Assessment Upper Extremity Assessment: Difficult to assess due to impaired cognition;Overall [facility] for tasks assessed   Lower Extremity Assessment Lower Extremity Assessment: Defer to PT evaluation RLE Deficits / Details: Acute pain, decreased strength and AROM consistent with above mentioned procedure.  RLE: Unable to fully assess due to pain   Cervical / Trunk Assessment Cervical / Trunk Assessment: Kyphotic   Communication Communication Communication: Expressive difficulties   Cognition Arousal/Alertness: Awake/alert Behavior During Therapy:  Agitated;Restless Overall Cognitive Status: Impaired/Different from baseline Area of Impairment: Orientation;Attention;Memory;Following commands;Safety/judgement;Awareness;Problem solving                 Orientation Level: Disoriented to;Time;Situation Current Attention Level: Focused Memory: Decreased short-term memory Following Commands: Follows one step commands inconsistently;Follows one step commands with increased  time Safety/Judgement: Decreased awareness of safety;Decreased awareness of deficits Awareness: Intellectual Problem Solving: Requires verbal cues;Difficulty sequencing;Slow processing     General Comments       Exercises     Shoulder Instructions      Home Living Family/patient expects to be discharged to:: Private residence Living Arrangements: Alone Available Help at Discharge: Family;Available 24 hours/day Type of Home: House Home Access: Level entry     Home Layout: One level     Bathroom Shower/Tub: Occupational psychologist: Standard     Home Equipment: None   Additional Comments: Home living information from taken from PT evaluation.      Prior Functioning/Environment    Comments: Pt did not report.        OT Problem List: Decreased strength;Decreased range of motion;Decreased activity tolerance;Impaired balance (sitting and/or standing);Decreased cognition;Decreased safety awareness;Decreased knowledge of precautions;Decreased knowledge of use of DME or AE;Pain      OT Treatment/Interventions: Self-care/ADL training;Therapeutic exercise;DME and/or AE instruction;Therapeutic activities;Cognitive remediation/compensation;Patient/family education;Balance training    OT Goals(Current goals can be found in the care plan section) Acute Rehab OT Goals Patient Stated Goal: None stated OT Goal Formulation: Patient unable to participate in goal setting Time For Goal Achievement: 12/21/17 Potential to Achieve Goals: Fair  OT Frequency: Min 2X/week   Barriers to D/C:            Co-evaluation              AM-PAC PT "6 Clicks" Daily Activity     Outcome Measure Help from another person eating meals?: A Lot Help from another person taking care of personal grooming?: A Lot Help from another person toileting, which includes using toliet, bedpan, or urinal?: Total Help from another person bathing (including washing, rinsing, drying)?: Total Help  from another person to put on and taking off regular upper body clothing?: Total Help from another person to put on and taking off regular lower body clothing?: Total 6 Click Score: 8   End of Session Equipment Utilized During Treatment: Oxygen Nurse Communication: (RN present last 2-3 minutes)  Activity Tolerance: Treatment limited secondary to agitation Patient left: in bed;with call bell/phone within reach  OT Visit Diagnosis: Unsteadiness on feet (R26.81);Muscle weakness (generalized) (M62.81);History of falling (Z91.81);Pain;Other symptoms and signs involving cognitive function Pain - Right/Left: Right Pain - part of body: Hip                Time: 0955-1007 OT Time Calculation (min): 12 min Charges:  OT General Charges $OT Visit: 1 Visit OT Evaluation $OT Eval Low Complexity: 1 Low G-Codes:      Darrol Jump OTR/L 12/07/2017, 10:17 AM

## 2017-12-08 ENCOUNTER — Encounter (HOSPITAL_COMMUNITY): Payer: Self-pay | Admitting: *Deleted

## 2017-12-08 LAB — CBC
HCT: 25.7 % — ABNORMAL LOW (ref 36.0–46.0)
Hemoglobin: 7.8 g/dL — ABNORMAL LOW (ref 12.0–15.0)
MCH: 27.6 pg (ref 26.0–34.0)
MCHC: 30.4 g/dL (ref 30.0–36.0)
MCV: 90.8 fL (ref 78.0–100.0)
Platelets: 177 10*3/uL (ref 150–400)
RBC: 2.83 MIL/uL — AB (ref 3.87–5.11)
RDW: 14.9 % (ref 11.5–15.5)
WBC: 10.2 10*3/uL (ref 4.0–10.5)

## 2017-12-08 LAB — BASIC METABOLIC PANEL
Anion gap: 10 (ref 5–15)
BUN: 19 mg/dL (ref 6–20)
CHLORIDE: 89 mmol/L — AB (ref 101–111)
CO2: 32 mmol/L (ref 22–32)
Calcium: 8.1 mg/dL — ABNORMAL LOW (ref 8.9–10.3)
Creatinine, Ser: 1.26 mg/dL — ABNORMAL HIGH (ref 0.44–1.00)
GFR calc Af Amer: 46 mL/min — ABNORMAL LOW (ref >60)
GFR calc non Af Amer: 40 mL/min — ABNORMAL LOW (ref >60)
Glucose, Bld: 104 mg/dL — ABNORMAL HIGH (ref 65–99)
POTASSIUM: 4.9 mmol/L (ref 3.5–5.1)
SODIUM: 131 mmol/L — AB (ref 135–145)

## 2017-12-08 MED ORDER — ENOXAPARIN SODIUM 30 MG/0.3ML ~~LOC~~ SOLN
30.0000 mg | SUBCUTANEOUS | Status: DC
  Administered 2017-12-09 – 2017-12-11 (×3): 30 mg via SUBCUTANEOUS
  Filled 2017-12-08 (×3): qty 0.3

## 2017-12-08 NOTE — Progress Notes (Signed)
  PROGRESS NOTE  Carol Carson TGY:[id number] DOB: 27-May-1941 DOA: 11/20/2017 PCP: Leeroy Cha, MD  Brief Narrative: 9yow PMH COPD presented with right hip pain s/p fall at home. Admitted for right hip fracture.  Assessment/Plan Closed right hip fx s/p mechanical fall at home. - s/p IM nail 12/30 - continue management per orthopedics. WBAT, enoxaparin.  COPD, chronic hypoxic resp failure on home oxygen 2LNC - currently on 4L, wean to 2 L as tolerated. Continue bronchodilators.   AKI superimposed on suspected CKD stage III. Suspect dehydration - creatinine improving with IVF, expect spontaneously resolution now.  Hypo-osmolar hyponatremia; hypochloridemia. TSH slightly high at 5.070. Likely secondary to dehydration - sodium slowly trending up 1/1, remains completely asymptomatic 1/1 - repeat TSH as an outpatient  ABLA superimposed on normocytic anemia - secondary initially to surgery, probably drop today secondary to dilution rather than bleeding - check CBC in AM  PMH SVT - stable, continue metoprolol, flecainide 1/1  Moderate malnutrition - supplements per dietician  Improving, anticipate transfer to SNF 1/2 if Hgb stable.  DVT prophylaxis: enoxaparin Code Status: full Family Communication:  Disposition Plan: SNF   Murray Hodgkins, MD  [facility] Direct contact: [phone] --[address]ia [address]  --[email or web address]; password [email or web address]  7PM-7AM contact night coverage as above 12/08/2017, 4:20 PM  LOS: 3 days   Consultants:  Orthopedics   Procedures:  12/30 INTRAMEDULLARY (IM) NAIL INTERTROCHANTRIC, RIGHT  Antimicrobials:    Interval history/Subjective: Feels ok. Breathing ok. Poor appetite.  Objective: Vitals:  Vitals:   12/08/17 0856 12/08/17 1300  BP:  (!) 123/44  Pulse:  92  Resp:  16  Temp:  98.5 F (36.9 C)  SpO2: 94% 95%    Exam:  Constitutional:   . Appears calm, mildly uncomfortable Respiratory:  . CTA bilaterally, no  w/r/r.  . Respiratory effort mildly increased.  Cardiovascular:  . RRR, no m/r/g . No LE extremity edema   Psychiatric:  . Mental status o Mood, affect appropriate  I have personally reviewed the following:   Labs:  Sodium 127 >> 126 >> 129 >> 131  Creatinine 1.6 >> 1.57 >> 1.54 >> 1.26  Hgb 9.2 >. 9.4 >> 7.8 (with hydration)    Scheduled Meds: . budesonide  0.25 mg Inhalation BID  . cholecalciferol  1,000 Units Oral Daily  . [START ON 12/09/2017] enoxaparin (LOVENOX) injection  30 mg Subcutaneous Q24H  . feeding supplement (ENSURE ENLIVE)  237 mL Oral Q24H  . flecainide  50 mg Oral BID  . metoprolol tartrate  50 mg Oral Q6H  . multivitamin with minerals  1 tablet Oral Daily  . polyethylene glycol  17 g Oral Daily  . simvastatin  20 mg Oral q1800  . umeclidinium-vilanterol  1 puff Inhalation Daily   Continuous Infusions: . sodium chloride 100 mL/hr at 12/08/17 1018    Principal Problem:   Closed hip fracture, right, initial encounter ([place]) Active Problems:   SVT (supraventricular tachycardia) (HCC)   COPD GOLD III/ emphysematous predominantly    Essential hypertension   COPD (chronic obstructive pulmonary disease) (HCC)   Fall   Hyponatremia   Closed displaced intertrochanteric fracture of right femur (HCC)   Acute renal failure superimposed on stage 3 chronic kidney disease (HCC)   Chronic respiratory failure with hypoxia (HCC)   Malnutrition of moderate degree   LOS: 3 days

## 2017-12-08 NOTE — NC FL2 (Signed)
[place] LEVEL OF CARE SCREENING TOOL     IDENTIFICATION  Patient Name: Carol Carson Birthdate: 07/29/1941 Sex: female Admission Date (Current Location): 11/14/2017  [facility] and [place] Number:  Herbalist and Address:  [facility] [place] [place] [address], [place], [place] [address]      Provider Number: [id number]  Attending Physician Name and Address:  Samuella Cota, MD  Relative Name and Phone Number:  Elane Peabody, son, [phone]    Current Level of Care: Hospital Recommended Level of Care: [place] Prior Approval Number:    Date Approved/Denied:   PASRR Number: [id number]  Discharge Plan: SNF    Current Diagnoses: Patient Active Problem List   Diagnosis Date Noted  . Malnutrition of moderate degree 12/07/2017  . Chronic respiratory failure with hypoxia ([place]) 11/22/2017  . Depression with anxiety 11/14/2017  . Fall 11/07/2017  . Closed hip fracture, right, initial encounter ([place]) 12/05/2017  . CKD (chronic kidney disease), stage III ([place]) 11/18/2017  . Hyponatremia 11/30/2017  . Closed displaced intertrochanteric fracture of right femur ([place]) 11/08/2017  . Acute renal failure superimposed on stage 3 chronic kidney disease ([place]) 11/29/2017  . Uterine cancer ([place])   . IBS (irritable bowel syndrome)   . Hypertension   . Hyperlipidemia   . Endometrial cancer ([place])   . DJD (degenerative joint disease)   . Diverticulosis   . COPD (chronic obstructive pulmonary disease) ([place])   . Chronic renal insufficiency   . Acute on chronic respiratory failure with hypoxia ([place])   . Tachycardia 10/17/2017  . Physical deconditioning 10/17/2017  . Coma ([place]) 10/10/2017  . Claudication ([place]) 10/08/2016  . RLS (restless legs syndrome) 10/10/2015  . Essential hypertension   . COPD exacerbation ([place]) 08/05/2013  . Acute on chronic respiratory failure with hypoxia and hypercapnia ([place]) 08/05/2013  . COPD  GOLD III/ emphysematous predominantly  01/13/2013  . SVT (supraventricular tachycardia) (HCC)     Orientation RESPIRATION BLADDER Height & Weight     Self, Time, Situation, Place  O2(Nasal Cannula 4L) Incontinent Weight: 107 lb (48.5 kg) Height:  5\' 3"  (160 cm)  BEHAVIORAL SYMPTOMS/MOOD NEUROLOGICAL BOWEL NUTRITION STATUS      Continent Diet(See DC Summary)  AMBULATORY STATUS COMMUNICATION OF NEEDS Skin   Extensive Assist Verbally Surgical wounds                       Personal Care Assistance Level of Assistance  Dressing, Bathing, Feeding Bathing Assistance: Maximum assistance Feeding assistance: Limited assistance Dressing Assistance: Maximum assistance     Functional Limitations Info  Sight, Hearing, Speech Sight Info: Adequate Hearing Info: Adequate Speech Info: Adequate    SPECIAL CARE FACTORS FREQUENCY  OT (By licensed OT), PT (By licensed PT)     PT Frequency: 5x week OT Frequency: 5x week            Contractures Contractures Info: Not present    Additional Factors Info  Code Status, Allergies Code Status Info: Full Allergies Info: CONTRAST MEDIA IODINATED DIAGNOSTIC AGENTS, BIAXIN CLARITHROMYCIN, PENICILLINS, SULFA ANTIBIOTICS            Current Medications (12/08/2017):  This is the current hospital active medication list Current Facility-Administered Medications  Medication Dose Route Frequency Provider Last Rate Last Dose  . 0.9 %  sodium chloride infusion   Intravenous Continuous Ivor Costa, MD 100 mL/hr at 12/08/17 1018    . ALPRAZolam (XANAX) tablet 0.25 mg  0.25 mg Oral BID PRN Ivor Costa, MD   0.25 mg at 12/07/17 2136  . budesonide (PULMICORT) nebulizer solution 0.25 mg  0.25 mg Inhalation BID Ivor Costa, MD   0.25 mg at 12/08/17 0856  . cholecalciferol (VITAMIN D) tablet 1,000 Units  1,000 Units Oral Daily Ivor Costa, MD   1,000 Units at 12/08/17 0957  . dextromethorphan-guaiFENesin (MUCINEX DM) 30-600 MG per 12 hr tablet 1 tablet  1 tablet  Oral BID PRN Ivor Costa, MD      . Derrill Memo ON 12/09/2017] enoxaparin (LOVENOX) injection 30 mg  30 mg Subcutaneous Q24H Karren Cobble, [place]      . feeding supplement (ENSURE ENLIVE) (ENSURE ENLIVE) liquid 237 mL  237 mL Oral Q24H Ivor Costa, MD   237 mL at 12/08/17 1001  . flecainide (TAMBOCOR) tablet 50 mg  50 mg Oral BID Ivor Costa, MD   50 mg at 12/08/17 0957  . hydrALAZINE (APRESOLINE) injection 5 mg  5 mg Intravenous Q2H PRN Ivor Costa, MD      . HYDROcodone-acetaminophen (NORCO/VICODIN) 5-325 MG per tablet 1 tablet  1 tablet Oral Q6H PRN Prudencio Burly III, PA-C   1 tablet at 12/08/17 1250  . levalbuterol (XOPENEX) nebulizer solution 1.25 mg  1.25 mg Nebulization Q6H PRN Samuella Cota, MD      . menthol-cetylpyridinium (CEPACOL) lozenge 3 mg  1 lozenge Oral PRN Prudencio Burly III, PA-C       Or  . phenol (CHLORASEPTIC) mouth spray 1 spray  1 spray Mouth/Throat PRN Prudencio Burly III, PA-C      . methocarbamol (ROBAXIN) tablet 500 mg  500 mg Oral Q8H PRN Ivor Costa, MD   500 mg at 11/22/2017 2206  . metoprolol tartrate (LOPRESSOR) tablet 50 mg  50 mg Oral Q6H Ivor Costa, MD   50 mg at 12/08/17 1250  . multivitamin with minerals tablet 1 tablet  1 tablet Oral Daily Ivor Costa, MD   1 tablet at 12/08/17 0957  . ondansetron (ZOFRAN) injection 4 mg  4 mg Intravenous Q8H PRN Ivor Costa, MD      . polyethylene glycol (MIRALAX / GLYCOLAX) packet 17 g  17 g Oral Daily Samuella Cota, MD   17 g at 12/08/17 0957  . senna-docusate (Senokot-S) tablet 1 tablet  1 tablet Oral QHS PRN Ivor Costa, MD      . simvastatin (ZOCOR) tablet 20 mg  20 mg Oral q1800 Ivor Costa, MD      . umeclidinium-vilanterol [facility] ELLIPTA) 62.5-25 MCG/INH 1 puff  1 puff Inhalation Daily Ivor Costa, MD   1 puff at 12/08/17 0859  . zolpidem (AMBIEN) tablet 5 mg  5 mg Oral QHS PRN Ivor Costa, MD         Discharge Medications: Please see discharge summary for a list of discharge  medications.  Relevant Imaging Results:  Relevant Lab Results:   Additional Information SS#: [id number]  Normajean Baxter, LCSW

## 2017-12-08 NOTE — Progress Notes (Signed)
Subjective: 2 Days Post-Op Procedure(s) (LRB): INTRAMEDULLARY (IM) NAIL INTERTROCHANTRIC, RIGHT (Right) Patient reports pain as mild and moderate.    Objective: Vital signs in last 24 hours: Temp:  [98.3 F (36.8 C)-98.7 F (37.1 C)] 98.3 F (36.8 C) (01/01 0555) Pulse Rate:  [98-106] 105 (01/01 0555) Resp:  [16-18] 18 (01/01 0555) BP: (124-150)/(35-51) 141/42 (01/01 0555) SpO2:  [91 %-98 %] 94 % (01/01 0856)  Intake/Output from previous day: 12/31 0701 - 01/01 0700 In: 500 [I.V.:500] Out: 150 [Urine:150] Intake/Output this shift: No intake/output data recorded.  Recent Labs    11/18/2017 2001 11/16/2017 0322 11/08/2017 1538 12/08/17 0425  HGB 9.2* 9.4* 9.1* 7.8*   Recent Labs    11/24/2017 1538 12/08/17 0425  WBC 13.2* 10.2  RBC 3.34* 2.83*  HCT 30.5* 25.7*  PLT 275 177   Recent Labs    12/07/17 0617 12/08/17 0425  NA 129* 131*  K 5.1 4.9  CL 84* 89*  CO2 33* 32  BUN 19 19  CREATININE 1.54* 1.26*  GLUCOSE 153* 104*  CALCIUM 8.1* 8.1*   Recent Labs    12/03/2017 2001  INR 1.05    Neurovascular intact Sensation intact distally Intact pulses distally Dorsiflexion/Plantar flexion intact Incision: dressing C/D/I No cellulitis present  Assessment/Plan: 2 Days Post-Op Procedure(s) (LRB): INTRAMEDULLARY (IM) NAIL INTERTROCHANTRIC, RIGHT (Right) Up with therapy  WBAT RLE dsg change prn lovenox dvt proph Pain control as ordered Likely SNF, d/c per med   [facility] 12/08/2017, 11:07 AM

## 2017-12-08 NOTE — Progress Notes (Signed)
Attempted to wean pt off of 4l o2.. Pt sats decreased to 80 at 2l,  85 at 3l and 94 at 4l. Unable to wean at this time.

## 2017-12-08 NOTE — Clinical Social Work Note (Signed)
Clinical Social Work Assessment  Patient Details  Name: Carol Carson MRN: [id number] Date of Birth: [birth year]  Date of referral:  12/08/17               Reason for consult:  Facility Placement                Permission sought to share information with:  Facility Art therapist granted to share information::  Yes, Release of Information Signed  Name::     Carol Carson  Agency::  SNF  Relationship::  son  Contact Information:     Housing/Transportation Living arrangements for the past 2 months:  Single Family Home Source of Information:  Patient Patient Interpreter Needed:  None Criminal Activity/Legal Involvement Pertinent to Current Situation/Hospitalization:  No - Comment as needed Significant Relationships:  Adult Children Lives with:  Self Do you feel safe going back to the place where you live?  No Need for family participation in patient care:  Yes (Comment)  Care giving concerns:  Pt from home alone. Pt indicated that she used walker and cane at home. Pt indicated that she managed all ADL's on her own at home. Pt not safe to return home at this time.   Pt gave permission for CSW to contact son. CSW left message for son to discuss SNF.  Pt confirmed that she has never experienced SNF, however in her tired state, CSW will f/u again on SNF options/placement. CSW will f/u.  Social Worker assessment / plan:  CSW completed FL2 and sent out to local SNF's for offers. CSW will complete disposition.  Employment status:  Retired Forensic scientist:  Medicare PT Recommendations:  [place] / Referral to community resources:  [place]  Patient/Family's Response to care:  Patient was tired and agreeable to SNF placement. No other issues identified. CSW will f/u.  Patient/Family's Understanding of and Emotional Response to Diagnosis, Current Treatment, and Prognosis:  Patient agreeable to SNF and acknowledged that  she resides alone and has no one to care for her. She appears to understands that rehab is a safer option and as she did not object to SNF placement and options. CSW will f/u with son and assist with the disposition process. No other issues or concerns identified.  Emotional Assessment Appearance:  Appears stated age Attitude/Demeanor/Rapport:  (Tired, but engaging ) Affect (typically observed):  Accepting, Appropriate Orientation:  Oriented to Self, Oriented to Place Alcohol / Substance use:  Not Applicable Psych involvement (Current and /or in the community):  No (Comment)  Discharge Needs  Concerns to be addressed:  Discharge Planning Concerns Readmission within the last 30 days:  No Current discharge risk:  Physical Impairment, Lives alone Barriers to Discharge:  No Barriers Identified   Normajean Baxter, LCSW 12/08/2017, 1:55 PM

## 2017-12-08 DEATH — deceased

## 2017-12-09 ENCOUNTER — Encounter (HOSPITAL_COMMUNITY): Payer: Self-pay | Admitting: Orthopedic Surgery

## 2017-12-09 ENCOUNTER — Inpatient Hospital Stay (HOSPITAL_COMMUNITY): Payer: Medicare Other

## 2017-12-09 ENCOUNTER — Telehealth: Payer: Self-pay | Admitting: Internal Medicine

## 2017-12-09 DIAGNOSIS — S72001A Fracture of unspecified part of neck of right femur, initial encounter for closed fracture: Secondary | ICD-10-CM

## 2017-12-09 DIAGNOSIS — N183 Chronic kidney disease, stage 3 (moderate): Secondary | ICD-10-CM

## 2017-12-09 DIAGNOSIS — N179 Acute kidney failure, unspecified: Secondary | ICD-10-CM

## 2017-12-09 DIAGNOSIS — J9601 Acute respiratory failure with hypoxia: Secondary | ICD-10-CM

## 2017-12-09 DIAGNOSIS — J449 Chronic obstructive pulmonary disease, unspecified: Secondary | ICD-10-CM

## 2017-12-09 DIAGNOSIS — I469 Cardiac arrest, cause unspecified: Secondary | ICD-10-CM

## 2017-12-09 LAB — BASIC METABOLIC PANEL
ANION GAP: 13 (ref 5–15)
Anion gap: 16 — ABNORMAL HIGH (ref 5–15)
Anion gap: 13 (ref 5–15)
BUN: 23 mg/dL — ABNORMAL HIGH (ref 6–20)
BUN: 32 mg/dL — AB (ref 6–20)
BUN: 32 mg/dL — AB (ref 6–20)
CALCIUM: 6.8 mg/dL — AB (ref 8.9–10.3)
CHLORIDE: 92 mmol/L — AB (ref 101–111)
CO2: 30 mmol/L (ref 22–32)
CO2: 26 mmol/L (ref 22–32)
CO2: 27 mmol/L (ref 22–32)
CREATININE: 1.77 mg/dL — AB (ref 0.44–1.00)
Calcium: 6.8 mg/dL — ABNORMAL LOW (ref 8.9–10.3)
Calcium: 6.7 mg/dL — ABNORMAL LOW (ref 8.9–10.3)
Chloride: 97 mmol/L — ABNORMAL LOW (ref 101–111)
Chloride: 93 mmol/L — ABNORMAL LOW (ref 101–111)
Creatinine, Ser: 1.44 mg/dL — ABNORMAL HIGH (ref 0.44–1.00)
Creatinine, Ser: 1.91 mg/dL — ABNORMAL HIGH (ref 0.44–1.00)
GFR calc Af Amer: 39 mL/min — ABNORMAL LOW (ref >60)
GFR calc Af Amer: 28 mL/min — ABNORMAL LOW (ref >60)
GFR calc non Af Amer: 27 mL/min — ABNORMAL LOW (ref >60)
GFR, EST AFRICAN AMERICAN: 31 mL/min — AB (ref >60)
GFR, EST NON AFRICAN AMERICAN: 34 mL/min — AB (ref >60)
GFR, EST NON AFRICAN AMERICAN: 24 mL/min — AB (ref >60)
GLUCOSE: 126 mg/dL — AB (ref 65–99)
GLUCOSE: 204 mg/dL — AB (ref 65–99)
Glucose, Bld: 231 mg/dL — ABNORMAL HIGH (ref 65–99)
POTASSIUM: 4.6 mmol/L (ref 3.5–5.1)
Potassium: 4.3 mmol/L (ref 3.5–5.1)
Potassium: 4.4 mmol/L (ref 3.5–5.1)
SODIUM: 140 mmol/L (ref 135–145)
SODIUM: 133 mmol/L — AB (ref 135–145)
Sodium: 134 mmol/L — ABNORMAL LOW (ref 135–145)

## 2017-12-09 LAB — MAGNESIUM: MAGNESIUM: 2 mg/dL (ref 1.7–2.4)

## 2017-12-09 LAB — BLOOD GAS, ARTERIAL
ACID-BASE EXCESS: 2.2 mmol/L — AB (ref 0.0–2.0)
Bicarbonate: 30.5 mmol/L — ABNORMAL HIGH (ref 20.0–28.0)
DRAWN BY: 33100
FIO2: 100
O2 Saturation: 98.8 %
PEEP/CPAP: 5 cmH2O
Patient temperature: 97.3
RATE: 18 resp/min
VT: 400 mL
pCO2 arterial: 88.7 mmHg (ref 32.0–48.0)
pH, Arterial: 7.156 — CL (ref 7.350–7.450)
pO2, Arterial: 177 mmHg — ABNORMAL HIGH (ref 83.0–108.0)

## 2017-12-09 LAB — COMPREHENSIVE METABOLIC PANEL
ALK PHOS: 55 U/L (ref 38–126)
ALT: 43 U/L (ref 14–54)
AST: 165 U/L — ABNORMAL HIGH (ref 15–41)
Albumin: 1.9 g/dL — ABNORMAL LOW (ref 3.5–5.0)
Anion gap: 16 — ABNORMAL HIGH (ref 5–15)
BUN: 23 mg/dL — ABNORMAL HIGH (ref 6–20)
CO2: 27 mmol/L (ref 22–32)
CREATININE: 1.4 mg/dL — AB (ref 0.44–1.00)
Calcium: 7.8 mg/dL — ABNORMAL LOW (ref 8.9–10.3)
Chloride: 95 mmol/L — ABNORMAL LOW (ref 101–111)
GFR, EST AFRICAN AMERICAN: 41 mL/min — AB (ref >60)
GFR, EST NON AFRICAN AMERICAN: 35 mL/min — AB (ref >60)
Glucose, Bld: 107 mg/dL — ABNORMAL HIGH (ref 65–99)
Potassium: 5.7 mmol/L — ABNORMAL HIGH (ref 3.5–5.1)
Sodium: 138 mmol/L (ref 135–145)
Total Bilirubin: 0.7 mg/dL (ref 0.3–1.2)
Total Protein: 4.1 g/dL — ABNORMAL LOW (ref 6.5–8.1)

## 2017-12-09 LAB — CBC WITH DIFFERENTIAL/PLATELET
Basophils Absolute: 0 10*3/uL (ref 0.0–0.1)
Basophils Relative: 0 %
EOS PCT: 2 %
Eosinophils Absolute: 0.2 10*3/uL (ref 0.0–0.7)
HCT: 26.4 % — ABNORMAL LOW (ref 36.0–46.0)
HEMOGLOBIN: 7.6 g/dL — AB (ref 12.0–15.0)
LYMPHS ABS: 3.3 10*3/uL (ref 0.7–4.0)
Lymphocytes Relative: 26 %
MCH: 27.7 pg (ref 26.0–34.0)
MCHC: 28.8 g/dL — ABNORMAL LOW (ref 30.0–36.0)
MCV: 96.4 fL (ref 78.0–100.0)
Monocytes Absolute: 0.4 10*3/uL (ref 0.1–1.0)
Monocytes Relative: 3 %
Neutro Abs: 8.7 10*3/uL — ABNORMAL HIGH (ref 1.7–7.7)
Neutrophils Relative %: 69 %
PLATELETS: 176 10*3/uL (ref 150–400)
RBC: 2.74 MIL/uL — AB (ref 3.87–5.11)
RDW: 15.3 % (ref 11.5–15.5)
WBC: 12.6 10*3/uL — AB (ref 4.0–10.5)

## 2017-12-09 LAB — POCT I-STAT 3, ART BLOOD GAS (G3+)
Acid-Base Excess: 6 mmol/L — ABNORMAL HIGH (ref 0.0–2.0)
Bicarbonate: 32.4 mmol/L — ABNORMAL HIGH (ref 20.0–28.0)
O2 SAT: 96 %
PCO2 ART: 55 mmHg — AB (ref 32.0–48.0)
PH ART: 7.374 (ref 7.350–7.450)
TCO2: 34 mmol/L — AB (ref 22–32)
pO2, Arterial: 79 mmHg — ABNORMAL LOW (ref 83.0–108.0)

## 2017-12-09 LAB — CBC
HCT: 25.2 % — ABNORMAL LOW (ref 36.0–46.0)
Hemoglobin: 7.4 g/dL — ABNORMAL LOW (ref 12.0–15.0)
MCH: 27.7 pg (ref 26.0–34.0)
MCHC: 29.4 g/dL — ABNORMAL LOW (ref 30.0–36.0)
MCV: 94.4 fL (ref 78.0–100.0)
Platelets: 198 10*3/uL (ref 150–400)
RBC: 2.67 MIL/uL — AB (ref 3.87–5.11)
RDW: 15.3 % (ref 11.5–15.5)
WBC: 7 10*3/uL (ref 4.0–10.5)

## 2017-12-09 LAB — TROPONIN I
TROPONIN I: 0.15 ng/mL — AB (ref <0.03)
TROPONIN I: 0.3 ng/mL — AB (ref <0.03)
Troponin I: 0.37 ng/mL (ref <0.03)

## 2017-12-09 LAB — PHOSPHORUS: PHOSPHORUS: 4.7 mg/dL — AB (ref 2.5–4.6)

## 2017-12-09 MED ORDER — SODIUM CHLORIDE 0.9 % IV BOLUS (SEPSIS)
1000.0000 mL | Freq: Once | INTRAVENOUS | Status: AC
  Administered 2017-12-09: 1000 mL via INTRAVENOUS

## 2017-12-09 MED ORDER — CHLORHEXIDINE GLUCONATE 0.12% ORAL RINSE (MEDLINE KIT)
15.0000 mL | Freq: Two times a day (BID) | OROMUCOSAL | Status: DC
  Administered 2017-12-09 – 2017-12-11 (×4): 15 mL via OROMUCOSAL

## 2017-12-09 MED ORDER — IOPAMIDOL (ISOVUE-370) INJECTION 76%
INTRAVENOUS | Status: AC
  Administered 2017-12-09: 80 mL
  Filled 2017-12-09: qty 100

## 2017-12-09 MED ORDER — SODIUM CHLORIDE 0.9% FLUSH
10.0000 mL | Freq: Two times a day (BID) | INTRAVENOUS | Status: DC
  Administered 2017-12-09 – 2017-12-11 (×4): 10 mL

## 2017-12-09 MED ORDER — CHLORHEXIDINE GLUCONATE CLOTH 2 % EX PADS
6.0000 | MEDICATED_PAD | Freq: Every day | CUTANEOUS | Status: DC
  Administered 2017-12-09 – 2017-12-11 (×3): 6 via TOPICAL

## 2017-12-09 MED ORDER — CEFTRIAXONE SODIUM 2 G IJ SOLR
2.0000 g | INTRAMUSCULAR | Status: DC
  Administered 2017-12-09 – 2017-12-10 (×2): 2 g via INTRAVENOUS
  Filled 2017-12-09 (×3): qty 2

## 2017-12-09 MED ORDER — PANTOPRAZOLE SODIUM 40 MG IV SOLR
40.0000 mg | INTRAVENOUS | Status: DC
  Administered 2017-12-09 – 2017-12-11 (×3): 40 mg via INTRAVENOUS
  Filled 2017-12-09 (×3): qty 40

## 2017-12-09 MED ORDER — MIDAZOLAM HCL 2 MG/2ML IJ SOLN
2.0000 mg | INTRAMUSCULAR | Status: DC | PRN
  Administered 2017-12-09 – 2017-12-10 (×5): 2 mg via INTRAVENOUS
  Filled 2017-12-09 (×4): qty 2

## 2017-12-09 MED ORDER — MIDAZOLAM HCL 2 MG/2ML IJ SOLN
INTRAMUSCULAR | Status: AC
  Administered 2017-12-09: 2 mg via INTRAVENOUS
  Filled 2017-12-09: qty 2

## 2017-12-09 MED ORDER — BUDESONIDE 0.25 MG/2ML IN SUSP: 0.5000 mg | Freq: Two times a day (BID) | RESPIRATORY_TRACT | Status: DC

## 2017-12-09 MED ORDER — IPRATROPIUM-ALBUTEROL 0.5-2.5 (3) MG/3ML IN SOLN
3.0000 mL | Freq: Four times a day (QID) | RESPIRATORY_TRACT | Status: DC
  Administered 2017-12-09 – 2017-12-11 (×9): 3 mL via RESPIRATORY_TRACT
  Filled 2017-12-09 (×11): qty 3

## 2017-12-09 MED ORDER — NOREPINEPHRINE BITARTRATE 1 MG/ML IV SOLN
0.0000 ug/min | INTRAVENOUS | Status: DC
  Administered 2017-12-09: 20 ug/min via INTRAVENOUS
  Administered 2017-12-09: 14 ug/min via INTRAVENOUS
  Administered 2017-12-09: 16 ug/min via INTRAVENOUS
  Filled 2017-12-09 (×3): qty 4

## 2017-12-09 MED ORDER — ORAL CARE MOUTH RINSE
15.0000 mL | OROMUCOSAL | Status: DC
  Administered 2017-12-09 – 2017-12-11 (×18): 15 mL via OROMUCOSAL

## 2017-12-09 MED ORDER — SODIUM CHLORIDE 0.9% FLUSH: 10.0000 mL | INTRAVENOUS | Status: DC | PRN

## 2017-12-09 MED ORDER — SODIUM CHLORIDE 0.9 % IV SOLN
INTRAVENOUS | Status: DC
  Administered 2017-12-09 – 2017-12-11 (×2): via INTRAVENOUS

## 2017-12-09 MED ORDER — BUDESONIDE 0.25 MG/2ML IN SUSP
0.5000 mg | Freq: Two times a day (BID) | RESPIRATORY_TRACT | Status: DC
  Administered 2017-12-09 – 2017-12-10 (×3): 0.5 mg via RESPIRATORY_TRACT
  Filled 2017-12-09 (×7): qty 4

## 2017-12-09 MED ORDER — METRONIDAZOLE IN NACL 5-0.79 MG/ML-% IV SOLN
500.0000 mg | Freq: Three times a day (TID) | INTRAVENOUS | Status: DC
  Administered 2017-12-09 – 2017-12-11 (×6): 500 mg via INTRAVENOUS
  Filled 2017-12-09 (×6): qty 100

## 2017-12-09 NOTE — Telephone Encounter (Signed)
lmtcb x1 for [employer].

## 2017-12-09 NOTE — Procedures (Signed)
Central Venous Catheter Insertion Procedure Note Carol Carson [id number] 1941/11/07  Procedure: Insertion of Central Venous Catheter Indications: Drug and/or fluid administration  Procedure Details Consent: Risks of procedure as well as the alternatives and risks of each were explained to the (patient/caregiver).  Consent for procedure obtained. Time Out: Verified patient identification, verified procedure, site/side was marked, verified correct patient position, special equipment/implants available, medications/allergies/relevent history reviewed, required imaging and test results available.  Performed  Maximum sterile technique was used including antiseptics, cap, gloves, gown, hand hygiene, mask and sheet. Skin prep: Chlorhexidine; local anesthetic administered A antimicrobial bonded/coated triple lumen catheter was placed in the right internal jugular vein using the Seldinger technique. Ultrasound guidance used.Yes.   Catheter placed to 16 cm. Blood aspirated via all 3 ports and then flushed x 3. Line sutured x 2 and dressing applied.  Evaluation Blood flow good Complications: No apparent complications Patient did tolerate procedure well. Chest X-ray ordered to verify placement.  CXR: pending.  Georgann Housekeeper, AGACNP-BC [facility] Pulmonology/Critical Care Pager [phone] or [phone]  12/09/2017 10:31 AM

## 2017-12-09 NOTE — Consult Note (Signed)
PULMONARY / CRITICAL CARE MEDICINE   Name: Carol Carson MRN: [id number] DOB: 10-21-1941    ADMISSION DATE:  11/18/2017 CONSULTATION DATE: January 2  REFERRING MD: [facility] hospitalist Dr. Sarajane Jews  CHIEF COMPLAINT: Cardiac arrest  HISTORY OF PRESENT ILLNESS:   77 year old female with past medical history as below, which is significant for COPD, chronic respiratory failure with hypoxia on 2 L, endometrial cancer in remission, hypertension, SVT.  She was recently admitted in November 2018 for hypercapnic respiratory failure thought to be secondary to acute exacerbation of COPD.  She required intubation and was difficult to wean but eventually was successfully extubated.  She completed a course of steroids after discharge.  At that time she had an echo which showed a dilated and poorly functioning right ventricle.  Other cardiac workup not significant for ischemia.  In the p.m. hours of 11/19/2017 she suffered a fall at home while attempting to put her slippers on.  She suffered a right hip fracture which was repaired with an intramedullary nail on 12/30.  She also required increased oxygen to 4 L from 2 L.  She did have a bump in her renal function and hyponatremia which were improving with IV fluid hydration.  Then in the early morning hours of 1/2 about 30-40 minutes after last being seen well, she was found to be in asystole.  She underwent 22 minutes of ACLS and intubation prior to ROSC.  She was transferred to the ICU where she suffered another 5 minutes of PEA.  Post arrest she was unresponsive.  PCCM asked to evaluate.  PAST MEDICAL HISTORY :  She  has a past medical history of Chronic renal insufficiency, Chronic respiratory failure with hypoxia ([place]) (11/19/2017), COPD (chronic obstructive pulmonary disease) ([place]), Diverticulosis, DJD (degenerative joint disease), Endometrial cancer ([place]), Hyperlipidemia, Hypertension, IBS (irritable bowel syndrome), RLS (restless legs syndrome), SVT  (supraventricular tachycardia) ([place]), and Uterine cancer ([place]).  PAST SURGICAL HISTORY: She  has a past surgical history that includes Lung surgery (2002) and Abdominal hysterectomy.  Allergies  Allergen Reactions  . Contrast Media [Iodinated Diagnostic Agents] Other (See Comments)    LOW KIDNEY FUNCTION///NEED TO CHECK WITH NEPHROLOGIST BEFORE PROCEDURE.  . Biaxin [Clarithromycin] Hives  . Penicillins Hives    Has patient had a PCN reaction causing immediate rash, facial/tongue/throat swelling, SOB or lightheadedness with hypotension: Yes Has patient had a PCN reaction causing severe rash involving mucus membranes or skin necrosis: No Has patient had a PCN reaction that required hospitalization: No Has patient had a PCN reaction occurring within the last 10 years: No If all of the above answers are "NO", then may proceed with Cephalosporin use.  . Sulfa Antibiotics Rash    No current facility-administered medications on file prior to encounter.    Current Outpatient Medications on File Prior to Encounter  Medication Sig  . ALPRAZolam (XANAX) 0.25 MG tablet Take 0.25 mg by mouth 2 (two) times daily as needed for anxiety or sleep.   . B Complex-C (SUPER B COMPLEX PO) Take 1 tablet daily by mouth.  . cholecalciferol (VITAMIN D) 1000 UNITS tablet Take 1,000 Units by mouth daily.  . feeding supplement, ENSURE ENLIVE, (ENSURE ENLIVE) LIQD Take 237 mLs daily by mouth.  . ferrous sulfate 325 (65 FE) MG tablet Take 325 mg by mouth daily as needed (for iron).   . flecainide (TAMBOCOR) 50 MG tablet Take 1 tablet (50 mg total) by mouth 2 (two) times daily.  Marland Kitchen levalbuterol (XOPENEX HFA) 45 MCG/ACT inhaler Inhale  2 puffs into the lungs every 4 (four) hours as needed for wheezing or shortness of breath.  . metoprolol tartrate (LOPRESSOR) 50 MG tablet Take 1 tablet (50 mg total) every 6 (six) hours by mouth.  . Mometasone Furoate (ASMANEX HFA) 100 MCG/ACT AERO Inhale 2 puffs into the lungs 2 (two)  times daily.  . Multiple Vitamin (MULTIVITAMIN WITH MINERALS) TABS tablet Take 1 tablet daily by mouth. Centrum  . simvastatin (ZOCOR) 20 MG tablet TAKE 1 TABLET (20 MG) BY MOUTH IN THE EVENING  . umeclidinium-vilanterol (ANORO ELLIPTA) 62.5-25 MCG/INH AEPB Inhale 1 puff into the lungs daily.  . OXYGEN Inhale 2-4 L See admin instructions into the lungs. 2lpm with rest, 4 with exertion and 3 lpm with sleep  . phosphorus (K PHOS NEUTRAL) 155-852-130 MG tablet Take 2 tablets (500 mg total) 2 (two) times daily by mouth. (Patient not taking: Reported on 11/24/2017)  . rOPINIRole (REQUIP) 0.25 MG tablet Take 1 tablet (0.25 mg total) by mouth 2 (two) times daily. (Patient not taking: Reported on 11/24/2017)    FAMILY HISTORY:  Her indicated that her mother is deceased. She indicated that her father is deceased. She indicated that two of her four sisters are alive. She indicated that only one of her two brothers is alive. She indicated that her maternal grandmother is deceased. She indicated that her maternal grandfather is deceased. She indicated that her paternal grandmother is deceased. She indicated that her paternal grandfather is deceased. She indicated that the status of her son is unknown. She indicated that the status of her neg hx is unknown.   SOCIAL HISTORY: She  reports that she quit smoking about 19 years ago. Her smoking use included cigarettes. She has a 55.90 pack-year smoking history. she has never used smokeless tobacco. She reports that she does not drink alcohol or use drugs.  REVIEW OF SYSTEMS:   Unable as patient is encephalopathic and intubated  SUBJECTIVE:    VITAL SIGNS: BP (!) 125/55   Pulse 96   Temp (!) 97.3 F (36.3 C) (Oral)   Resp (!) 28   Ht 5\' 3"  (1.6 m)   Wt 48.5 kg (107 lb)   SpO2 100%   BMI 18.95 kg/m   HEMODYNAMICS:    VENTILATOR SETTINGS: Vent Mode: PRVC FiO2 (%):  [80 %] 80 % Set Rate:  [28 bmp] 28 bmp Vt Set:  [400 mL] 400 mL PEEP:  [5 cmH20]  5 cmH20 Plateau Pressure:  [31 cmH20] 31 cmH20  INTAKE / OUTPUT: I/O last 3 completed shifts: In: 270 [P.O.:270] Out: 74 [Urine:820]  PHYSICAL EXAMINATION: General: Adult female with normal body habitus  Neuro: GCS 3 HEENT:  [place]/AT, PERRL, no JVD Cardiovascular: Regular. Tachy. No MRG Lungs:  Clear bilateral breath sounds. Crepitus to bilateral clavicular regions Abdomen:  Soft, non-distended Musculoskeletal:  No acute deformity Skin:  Grossly intact  LABS:  BMET Recent Labs  Lab 11/08/2017 1538 12/07/17 0617 12/08/17 0425  NA 128* 129* 131*  K 4.6 5.1 4.9  CL 87* 84* 89*  CO2 32 33* 32  BUN 16 19 19   CREATININE 1.44* 1.54* 1.26*  GLUCOSE 142* 153* 104*    Electrolytes Recent Labs  Lab 11/22/2017 1538 12/07/17 0617 12/08/17 0425  CALCIUM 7.9* 8.1* 8.1*    CBC Recent Labs  Lab 11/17/2017 1538 12/08/17 0425 12/09/17 0448  WBC 13.2* 10.2 7.0  HGB 9.1* 7.8* 7.4*  HCT 30.5* 25.7* 25.2*  PLT 275 177 198    Coag's Recent Labs  Lab 11/14/2017 2001 11/11/2017 2114  APTT  --  30  INR 1.05  --     Sepsis Markers No results for input(s): LATICACIDVEN, PROCALCITON, O2SATVEN in the last 168 hours.  ABG Recent Labs  Lab 12/09/17 0726  PHART 7.156*  PCO2ART 88.7*  PO2ART 177*    Liver Enzymes No results for input(s): AST, ALT, ALKPHOS, BILITOT, ALBUMIN in the last 168 hours.  Cardiac Enzymes No results for input(s): TROPONINI, PROBNP in the last 168 hours.  Glucose No results for input(s): GLUCAP in the last 168 hours.  Imaging Dg Neck Soft Tissue  Result Date: 12/09/2017 CLINICAL DATA:  Cough EXAM: NECK SOFT TISSUES - 1+ VIEW COMPARISON:  None. FINDINGS: Endotracheal tube is noted in place. Mottled density is noted over the inferior aspect of the neck on the lateral projection but shown to be more lateral in positioning on the frontal film. No other focal abnormality is noted. Degenerative change of the cervical spine is seen. IMPRESSION: Mottled air  density over the soft tissues of the neck but placed particularly laterally. No definitive tracheal abnormality is noted. Need for further evaluation can be determined on a clinical basis. Electronically Signed   By: Inez Catalina M.D.   On: 12/09/2017 07:38   Dg Chest Port 1 View  Result Date: 12/09/2017 CLINICAL DATA:  Cardiac arrest.  Code.  Endotracheal tube placement. EXAM: PORTABLE CHEST 1 VIEW COMPARISON:  Chest x-ray 12/04/2017 FINDINGS: The heart size is normal. Endotracheal tube terminates 4 cm above the carina. There is no mediastinal air. Bibasilar lower lobe airspace disease is present. There is no edema. A right effusion is suspected. IMPRESSION: 1. Satisfactory positioning of endotracheal tube without pneumomediastinum. 2. Right greater than left lower lobe airspace disease is concerning for pneumonia. Electronically Signed   By: San Morelle M.D.   On: 12/09/2017 07:40     STUDIES:  CT head 1/2 > CTA chest 1/2 > CT neck 1/2 >  CULTURES:   ANTIBIOTICS:   SIGNIFICANT EVENTS: 12/29 admit 12/30 OR for repair of hip fx with IM nail.   LINES/TUBES: CVL 1/2 > Art 1/2 > ETT 1/2 >  DISCUSSION: 77 year old female with past medical history of COPD suffered a fall with right hip fracture on 12/29.  This was repaired in the OR on 12/30.  She was progressing as expected until the morning hours of 1/2 when she suffered a cardiac arrest with up to 30 minutes unwitnessed downtime prior and then approximately 30 minutes of asystole.  Post code she is unresponsive with a GCS of 3 and requiring Levophed at 20 mcg for blood pressure support.  She was intubated for airway protection.  Because of rest not entirely clear but differential includes intracranial injury, pulmonary embolus, and primary respiratory arrest.  ASSESSMENT / PLAN:  PULMONARY A: COPD without acute exacerbation Acute on chronic hypercarbic/hypoxemic respiratory failure Subcutaneous emphysema (no PTX on CXR, ?  Tracheal injury)  P:   Full vent support Need to wean FiO2 for pulse ox of 88-95% CTA to rule out PE CT neck to assess for airway injury.  ABG CXR follow  VAP bundle Continue inhaled bronchodilators, steroids.   CARDIOVASCULAR A:  Asystole/PEA cardiac arrest (25-30 minutes in duration, up to 30-40 times unwitnessed  Shock likely cardiogenic, possibly obstructive if PE  P:  Hemodynamic monitoring Levophed to maintain MAP 81mmHg Trend troponin Place CVL, Arterial line Echo at some point.  Not a candidate for TTM therapy due to asystole with prolonged downtime (  up to 1 hour possibly)  RENAL A:   CKD AKI improving (Creatinine 1.5 on admit now 1.29) Will get dye for CT so will need to monitor closely Hyponatremia  P:   Follow chemistry IVF resuscitation  GASTROINTESTINAL A:   No acute issues  P:   NPO for now PPI  HEMATOLOGIC A:   ? PE Anemia acute on chronic.   P:  Close monitoring Transfuse for HGB < 7.  No evidence active bleeding Continue lovenox for VTE ppx CTA pending, may need full dose anticoag.  INFECTIOUS A:   No acute issues  P:     ENDOCRINE A:   No acute issues   P:   Follow glucose on chemistry  NEUROLOGIC A:   Acute encephalopathy at risk anoxic encephalopathy, also metabolic in setting acidosis.   P:   RASS goal: 0 to -1 No sedation needed for now, will monitor off.  CT head May need EEG at some point, no seizure activity noted, but potential for NCSE exists.  Will need neuro eval once stabilizes and has had some time.  FAMILY  - Updates:  Son updated in ICU room. As long as there is a chance for reasonable neurologic recovery he would like to be fully aggressive with care.   - Inter-disciplinary family meet or Palliative Care meeting due by:  1/8   Georgann Housekeeper, AGACNP-BC [place] Pulmonology/Critical Care Pager [phone] or [phone]  12/09/2017 8:39 AM

## 2017-12-09 NOTE — Telephone Encounter (Signed)
We have attempted to contact Beth with [facility] several times with no success or call back. Per triage protocol, message will be closed.

## 2017-12-09 NOTE — Progress Notes (Signed)
myclonic twitching continues, medicated with versed. No response to pain , no gag or cough. Urine output minimal. Decreasing levophed slowly.

## 2017-12-09 NOTE — Progress Notes (Signed)
Patient arrested briefly when arrived to ICU. CPR and meds given, see code sheet. Intubated, CXR obtained. Crepitus felt  along clavicular margin bilaterally and up neck, neck with a bluish gray bruising, edema. ROSC achieved stableized with fluids and levophed. abg obtained and seen by Jaclynn Guarneri, NP.

## 2017-12-09 NOTE — Progress Notes (Signed)
Nutrition Follow Up  DOCUMENTATION CODES:   Non-severe (moderate) malnutrition in context of chronic illness  INTERVENTION:    If TF started, rec Vital AF 1.2 at goal rate of 40 ml/h (960 ml per day)   Provides 1152 kcals, 72 gm protein, 778 ml free water daily  Liquid MVI daily to help meet RDI's  NUTRITION DIAGNOSIS:   Moderate Malnutrition related to chronic illness(COPD, CKD) as evidenced by moderate fat depletion, moderate muscle depletion, percent weight loss (6% x 1.5 month) Ongoing  GOAL:   Patient will meet greater than or equal to 90% of their needs Currently unmet  MONITOR:   Vent status, Labs, Weight trends, Skin, I & O's  ASSESSMENT:   77 yo Female with PMH significant of hypertension, hyperlipidemia, COPD on 2 L oxygen at home, depression with anxiety, remote uterine cancer in remission, SVT, CKD-3, iron deficiency anemia, IBS, who presents with fall and right hip pain.   12/30 S/P IM nailing  01/02 Transferred to 2H-ICU  Patient is currently intubated on ventilator support MV: 11.6 L/min Temp (24hrs), Avg:97.8 F (36.6 C), Min:97.3 F (36.3 C), Max:98.2 F (36.8 C)  Pt s/p cardiac arrest. Post arrest she was unresponsive. Prior to ICU transfer, pt was on a Heart Healthy diet with very poor PO intake. Identified with moderate malnutrition upon initial RD assessment. Labs and medications reviewed. K 5.7 (H). P 4.7 (H). CBG's [id number].  Diet Order:  Diet NPO time specified  EDUCATION NEEDS:   No education needs have been identified at this time  Skin:  Skin Assessment: Reviewed RN Assessment  Last BM:  12/29   Intake/Output Summary (Last 24 hours) at 12/09/2017 1315 Last data filed at 12/09/2017 0500 Gross per 24 hour  Intake 170 ml  Output 670 ml  Net -500 ml   Height:   Ht Readings from Last 1 Encounters:  12/07/17 5\' 3"  (1.6 m)   Weight:   Wt Readings from Last 1 Encounters:  12/07/17 107 lb (48.5 kg)   Wt Readings from Last 10  Encounters:  12/07/17 107 lb (48.5 kg)  11/24/17 107 lb 9.6 oz (48.8 kg)  10/20/17 114 lb 6.4 oz (51.9 kg)  09/01/17 115 lb 12.8 oz (52.5 kg)  07/15/17 115 lb 9.6 oz (52.4 kg)  07/07/17 117 lb (53.1 kg)  06/30/17 115 lb 12.8 oz (52.5 kg)  05/26/17 115 lb (52.2 kg)  05/06/17 115 lb (52.2 kg)  01/30/17 120 lb (54.4 kg)   Ideal Body Weight:  52.2 kg  BMI:  Body mass index is 18.95 kg/m.  Estimated Nutritional Needs:   Kcal:  1195  Protein:  70-85 gm  Fluid:  per MD  Arthur Holms, RD, LDN Pager #: [phone] After-Hours Pager #: [phone]

## 2017-12-09 NOTE — Procedures (Signed)
Arterial Catheter Insertion Procedure Note BONNE WHACK [id number] Mar 14, 1941  Procedure: Insertion of Arterial Catheter  Indications: Blood pressure monitoring  Procedure Details Consent: Risks of procedure as well as the alternatives and risks of each were explained to the (patient/caregiver).  Consent for procedure obtained. Time Out: Verified patient identification, verified procedure, site/side was marked, verified correct patient position, special equipment/implants available, medications/allergies/relevent history reviewed, required imaging and test results available.  Performed  Maximum sterile technique was used including antiseptics, cap, gloves, hand hygiene, mask and sheet. Skin prep: Chlorhexidine; local anesthetic administered 20 gauge catheter was inserted into right radial artery using the Seldinger technique.  Evaluation Blood flow good; BP tracing good. Complications: No apparent complications.   Georgann Housekeeper, AGACNP-BC [facility] Pulmonology/Critical Care Pager [phone] or [phone]  12/09/2017 10:32 AM

## 2017-12-09 NOTE — Progress Notes (Signed)
[facility] Progress Note Patient Name: EVERLENA MACKLEY DOB: Aug 21, 1941 MRN: [id number]   Date of Service  12/09/2017  HPI/Events of Note  Oliguria p-arrest  eICU Interventions  CVP 6 NS 1 L bolus     Intervention Category Intermediate Interventions: Oliguria - evaluation and management  Eudell Julian V. Purva Vessell 12/09/2017, 11:19 PM

## 2017-12-09 NOTE — Progress Notes (Signed)
Back to room, Central line and aline placed with US guidance y P hoffman. Patient having some tremor, jerking motions, opens eyes when these occur but then goes back to eyes closed. Does not react to any stimulus, pain, etc. GCS 3 att his time. No cough or gag response. Foley and NG tube placed.

## 2017-12-09 NOTE — Progress Notes (Signed)
OT Cancellation Note  Patient Details Name: Carol Carson MRN: [id number] DOB: 10-27-1941   Cancelled Treatment:    Reason Eval/Treat Not Completed: Medical issues which prohibited therapy. Medical issues which prohibited therapy. Per events of this AM, holding therapy until medically appropriate. OT will continue to follow as appropriate   Britt Bottom 12/09/2017, 1:03 PM

## 2017-12-09 NOTE — Plan of Care (Signed)
Patient arrested with morning. Neurologically remains to be seen, however at this time no response. Has ventilator, foley, central line, alline. On levophed

## 2017-12-09 NOTE — Progress Notes (Addendum)
Pharmacy Antibiotic Note  Carol Carson is a 77 y.o. female s/p cardiac arrest with concern of aspiration PNA.  Pharmacy has been consulted for Unasyn dosing (PCN allergy noted).  Spoke to MD and will change to rocephin and flagyl -WBC= 12.6, afebrile, SCr= 1.4 and CrCl ~ 35  Plan: -Rocephin 2gm IV q24h -Flagyl 500mg  IV q8h -Will follow renal function, cultures and clinical progress   Height: 5\' 3"  (160 cm) Weight: 107 lb (48.5 kg) IBW/kg (Calculated) : 52.4  Temp (24hrs), Avg:98 F (36.7 C), Min:97.3 F (36.3 C), Max:98.5 F (36.9 C)  Recent Labs  Lab 11/16/2017 0322 11/30/2017 1538 12/07/17 0617 12/08/17 0425 12/09/17 0448 12/09/17 0726 12/09/17 0828  WBC 8.9 13.2*  --  10.2 7.0  --  12.6*  CREATININE 1.57* 1.44* 1.54* 1.26*  --  1.44* 1.40*    Estimated Creatinine Clearance: 25.8 mL/min (A) (by C-G formula based on SCr of 1.4 mg/dL (H)).    Allergies  Allergen Reactions  . Contrast Media [Iodinated Diagnostic Agents] Other (See Comments)    LOW KIDNEY FUNCTION///NEED TO CHECK WITH NEPHROLOGIST BEFORE PROCEDURE.  . Biaxin [Clarithromycin] Hives  . Penicillins Hives    Has patient had a PCN reaction causing immediate rash, facial/tongue/throat swelling, SOB or lightheadedness with hypotension: Yes Has patient had a PCN reaction causing severe rash involving mucus membranes or skin necrosis: No Has patient had a PCN reaction that required hospitalization: No Has patient had a PCN reaction occurring within the last 10 years: No If all of the above answers are "NO", then may proceed with Cephalosporin use.  . Sulfa Antibiotics Rash     Thank you for allowing pharmacy to be a part of this patient's care.  Hildred Laser, Pharm D 12/09/2017 11:09 AM

## 2017-12-09 NOTE — Progress Notes (Signed)
PT Cancellation Note  Patient Details Name: Carol Carson MRN: [id number] DOB: November 30, 1941   Cancelled Treatment:    Reason Eval/Treat Not Completed: Medical issues which prohibited therapy. Per events of this AM, holding therapy until medically appropriate for mobilization. PT will continue to follow pending progress.   Reinaldo Berber, PT, DPT Acute Rehab Services Pager: [phone]    Reinaldo Berber 12/09/2017, 9:56 AM

## 2017-12-09 NOTE — Progress Notes (Signed)
CT chest reviewed. Appears as though she may have aspirated during code. No PE. No PTX. Rib fx on R. Non-displaced to minimally displaced. CT neck pending  Will start Unasyn  Myoclonus vs seizure now developing  EEG, PRV versed. Georgann Housekeeper, AGACNP-BC [place] [facility] Pager [phone] or [phone]  12/09/2017 10:33 AM

## 2017-12-09 NOTE — Progress Notes (Signed)
INITIALLY:  RN had just spoken with patient 45 mins prior and walked in to get her vitals. At this time, RN found that the patient was not breathing and that her skin was pale. She was last seen normal 45 mins prior.    CODE BLUE: RN pulled the code alert and called for help. Compressions started roughly at 6:28am. Rapid response and MD was at bedside shortly after. Report was given to both rapid and MD about patient. Code team arrived and compressions continued.    TRANSFERRED: After re-gaining a pulse, patient then got a bed on 2H. She was then rushed downstairs while this RN kept her hands on the femoral pulse. Patient then coded again on 2H- compression was resumed. After gaining a pulse again- this RN gave report to [facility] RN.    Family was present when RN was leaving floor. RN spoke to chaplin who ensured RN that he would speak to family.

## 2017-12-09 NOTE — Progress Notes (Signed)
Down to CT scan  With tele, vent, RT in attendance. to get CT. patient tolerated well. On 30mcg/min of levophed.

## 2017-12-09 NOTE — Progress Notes (Signed)
EEG completed, results pending. 

## 2017-12-09 NOTE — Progress Notes (Signed)
   12/09/17 0700  Clinical Encounter Type  Visited With Patient;Family;Health care provider  Visit Type Code  Referral From Nurse  Consult/Referral To Chaplain  Spiritual Encounters  Spiritual Needs Emotional   Responded to a Code Blue for this patient while on 5N.  Was able to contact family and alerted them to come to the hospital.  Patient was transferred to [facility].  I waited for family and escorted them to [facility].  Facilitated a conversation between medical staff and the family.  Provided comfort and support.  Will follow as needed. Chaplain Katherene Ponto

## 2017-12-10 DIAGNOSIS — I469 Cardiac arrest, cause unspecified: Secondary | ICD-10-CM

## 2017-12-10 LAB — COMPREHENSIVE METABOLIC PANEL
ALK PHOS: 64 U/L (ref 38–126)
ALT: 199 U/L — AB (ref 14–54)
ANION GAP: 8 (ref 5–15)
AST: 547 U/L — ABNORMAL HIGH (ref 15–41)
Albumin: 1.8 g/dL — ABNORMAL LOW (ref 3.5–5.0)
BUN: 32 mg/dL — ABNORMAL HIGH (ref 6–20)
CALCIUM: 6.2 mg/dL — AB (ref 8.9–10.3)
CO2: 27 mmol/L (ref 22–32)
CREATININE: 1.9 mg/dL — AB (ref 0.44–1.00)
Chloride: 99 mmol/L — ABNORMAL LOW (ref 101–111)
GFR, EST AFRICAN AMERICAN: 28 mL/min — AB (ref >60)
GFR, EST NON AFRICAN AMERICAN: 24 mL/min — AB (ref >60)
Glucose, Bld: 173 mg/dL — ABNORMAL HIGH (ref 65–99)
Potassium: 4.2 mmol/L (ref 3.5–5.1)
Sodium: 134 mmol/L — ABNORMAL LOW (ref 135–145)
Total Bilirubin: 0.5 mg/dL (ref 0.3–1.2)
Total Protein: 4.2 g/dL — ABNORMAL LOW (ref 6.5–8.1)

## 2017-12-10 LAB — CBC
HCT: 22.4 % — ABNORMAL LOW (ref 36.0–46.0)
HEMOGLOBIN: 7 g/dL — AB (ref 12.0–15.0)
MCH: 28.1 pg (ref 26.0–34.0)
MCHC: 31.3 g/dL (ref 30.0–36.0)
MCV: 90 fL (ref 78.0–100.0)
PLATELETS: 157 10*3/uL (ref 150–400)
RBC: 2.49 MIL/uL — ABNORMAL LOW (ref 3.87–5.11)
RDW: 15.8 % — ABNORMAL HIGH (ref 11.5–15.5)
WBC: 11.6 10*3/uL — ABNORMAL HIGH (ref 4.0–10.5)

## 2017-12-10 LAB — MAGNESIUM: MAGNESIUM: 1.6 mg/dL — AB (ref 1.7–2.4)

## 2017-12-10 MED ORDER — BUDESONIDE 0.5 MG/2ML IN SUSP
0.5000 mg | Freq: Two times a day (BID) | RESPIRATORY_TRACT | Status: DC
  Administered 2017-12-10 – 2017-12-11 (×2): 0.5 mg via RESPIRATORY_TRACT
  Filled 2017-12-10 (×2): qty 2

## 2017-12-10 MED ORDER — FLECAINIDE ACETATE 50 MG PO TABS: 50.0000 mg | ORAL_TABLET | Freq: Two times a day (BID) | ORAL | Status: DC

## 2017-12-10 MED ORDER — SODIUM CHLORIDE 0.9 % IV BOLUS (SEPSIS)
500.0000 mL | Freq: Once | INTRAVENOUS | Status: AC
  Administered 2017-12-10: 500 mL via INTRAVENOUS

## 2017-12-10 MED ORDER — METOPROLOL TARTRATE 5 MG/5ML IV SOLN
2.5000 mg | INTRAVENOUS | Status: DC | PRN
  Administered 2017-12-10 (×2): 5 mg via INTRAVENOUS
  Administered 2017-12-10: 2.5 mg via INTRAVENOUS
  Administered 2017-12-10 – 2017-12-11 (×5): 5 mg via INTRAVENOUS
  Filled 2017-12-10 (×8): qty 5

## 2017-12-10 MED ORDER — MAGNESIUM SULFATE IN D5W 1-5 GM/100ML-% IV SOLN
1.0000 g | Freq: Once | INTRAVENOUS | Status: AC
  Administered 2017-12-10: 1 g via INTRAVENOUS
  Filled 2017-12-10: qty 100

## 2017-12-10 MED ORDER — FENTANYL CITRATE (PF) 100 MCG/2ML IJ SOLN
25.0000 ug | INTRAMUSCULAR | Status: DC | PRN
  Administered 2017-12-10 – 2017-12-11 (×2): 25 ug via INTRAVENOUS
  Filled 2017-12-10 (×2): qty 2

## 2017-12-10 MED FILL — Medication: Qty: 1 | Status: AC

## 2017-12-10 NOTE — Consult Note (Signed)
PULMONARY / CRITICAL CARE MEDICINE   Name: Carol Carson MRN: [id number] DOB: 02-06-1941    ADMISSION DATE:  11/07/2017 CONSULTATION DATE: January 2  REFERRING MD: [facility] hospitalist Dr. Sarajane Jews  CHIEF COMPLAINT: Cardiac arrest  HISTORY OF PRESENT ILLNESS:   77 year old female with past medical history as below, which is significant for COPD, chronic respiratory failure with hypoxia on 2 L, endometrial cancer in remission, hypertension, SVT.  She was recently admitted in November 2018 for hypercapnic respiratory failure thought to be secondary to acute exacerbation of COPD.  She required intubation and was difficult to wean but eventually was successfully extubated.  She completed a course of steroids after discharge.  At that time she had an echo which showed a dilated and poorly functioning right ventricle.  Other cardiac workup not significant for ischemia.  In the p.m. hours of 11/28/2017 she suffered a fall at home while attempting to put her slippers on.  She suffered a right hip fracture which was repaired with an intramedullary nail on 12/30.  She also required increased oxygen to 4 L from 2 L.  She did have a bump in her renal function and hyponatremia which were improving with IV fluid hydration.  Then in the early morning hours of 1/2 about 30-40 minutes after last being seen well, she was found to be in asystole.  She underwent 22 minutes of ACLS and intubation prior to ROSC.  She was transferred to the ICU where she suffered another 5 minutes of PEA.  Post arrest she was unresponsive.  PCCM asked to evaluate.   SUBJECTIVE:  RN reports levophed turned off ~ 11 am.  Pt remains tachycardic. CVP 2.  Note drop in Hgb (pt received fluid bolus in last 24 hours, admit hgb ~9.4).  Last versed 0300.    VITAL SIGNS: BP 116/60   Pulse (!) 135   Temp 98.1 F (36.7 C) (Esophageal)   Resp 10   Ht 5\' 3"  (1.6 m)   Wt 107 lb (48.5 kg)   SpO2 98%   BMI 18.95 kg/m   HEMODYNAMICS: CVP:   [2 mmHg-16 mmHg] 2 mmHg  VENTILATOR SETTINGS: Vent Mode: PRVC FiO2 (%):  [40 %-100 %] 40 % Set Rate:  [28 bmp] 28 bmp Vt Set:  [420 mL] 420 mL PEEP:  [5 cmH20] 5 cmH20 Pressure Support:  [20 cmH20] 20 cmH20 Plateau Pressure:  [20 cmH20-28 cmH20] 20 cmH20  INTAKE / OUTPUT: I/O last 3 completed shifts: In: 2120.2 [I.V.:1780.2; NG/GT:40; IV Piggyback:300] Out: 900 [Urine:870; Emesis/NG output:30]  PHYSICAL EXAMINATION: General: frail elderly female in NAD on vent  HEENT: MM pink/moist, ETT Neuro: pupils 42mm R, minimal gag, low volumes on PSV CV: s1s2 rrr, no m/r/g PULM: even/non-labored, lungs bilaterally coarse  NU:UVOZ, non-tender, bsx4 active  Extremities: warm/dry, no edema, R hip dressing c/d/i  Skin: no rashes or lesions  LABS:  BMET Recent Labs  Lab 12/09/17 1427 12/09/17 2132 12/10/17 0505  NA 134* 133* 134*  K 4.3 4.4 4.2  CL 92* 93* 99*  CO2 26 27 27   BUN 32* 32* 32*  CREATININE 1.77* 1.91* 1.90*  GLUCOSE 231* 204* 173*    Electrolytes Recent Labs  Lab 12/09/17 0828 12/09/17 1427 12/09/17 2132 12/10/17 0505  CALCIUM 7.8* 6.8* 6.7* 6.2*  MG 2.0  --   --  1.6*  PHOS 4.7*  --   --   --     CBC Recent Labs  Lab 12/09/17 0448 12/09/17 0828 12/10/17 0505  WBC  7.0 12.6* 11.6*  HGB 7.4* 7.6* 7.0*  HCT 25.2* 26.4* 22.4*  PLT 198 176 157    Coag's Recent Labs  Lab 11/27/2017 2001 11/20/2017 2114  APTT  --  30  INR 1.05  --     Sepsis Markers No results for input(s): LATICACIDVEN, PROCALCITON, O2SATVEN in the last 168 hours.  ABG Recent Labs  Lab 12/09/17 0726 12/09/17 1229  PHART 7.156* 7.374  PCO2ART 88.7* 55.0*  PO2ART 177* 79.0*    Liver Enzymes Recent Labs  Lab 12/09/17 0828 12/10/17 0505  AST 165* 547*  ALT 43 199*  ALKPHOS 55 64  BILITOT 0.7 0.5  ALBUMIN 1.9* 1.8*    Cardiac Enzymes Recent Labs  Lab 12/09/17 0828 12/09/17 1659 12/09/17 2132  TROPONINI 0.15* 0.30* 0.37*    Glucose No results for input(s):  GLUCAP in the last 168 hours.  Imaging No results found.   STUDIES:  CT head 1/2 > no acute abnormalities  CTA chest 1/2 > neg for PE, multiple acute right sided rib fractures, subcutaneous emphysema throughout the anterior aspect of the chest extending into the cervical region, patchy consolidation c/w multilobar PNA (recent aspiration), aortic atherosclerosis CT neck 1/2 > Superficial subcutaneous gas tracking cephalad from the thoracic inlet. There is a small solitary deep focus of gas posterior to the right thyroid lobe, but the other deep soft tissue spaces of the neck are normal, intubated. Motion artifact. No other acute findings in the neck.  3. Nondisplaced anterior right second rib fracture as seen on the chest CTA today reported separately. EEG 1/2 >> consistent with severe diffuse cerebral dysfunction that is non-specific in etiology & can be sen in the setting of anoxic/ischemic injury, toxic metabolic encephalopathies or medication effect.    CULTURES:   ANTIBIOTICS: Rocephin 1/2 >>  Flagyl 1/2 >>   SIGNIFICANT EVENTS: 12/29 admit 12/30 OR for repair of hip fx with IM nail.  01/02 Cardiac arrest   LINES/TUBES: CVL 1/2 >> Art 1/2 >> ETT 1/2 >>  DISCUSSION: 77 year old female with past medical history of COPD suffered a fall with right hip fracture on 12/29.  This was repaired in the OR on 12/30.  She was progressing as expected until the morning hours of 1/2 when she suffered a cardiac arrest with up to 30 minutes unwitnessed downtime prior and then approximately 30 minutes of asystole. Post code she is unresponsive & required Levophed at 20 mcg for blood pressure support.  She was intubated for airway protection.  Cause of rest not entirely clear but differential includes intracranial injury (CT negative), pulmonary embolus (ruled out), and primary respiratory arrest.  ASSESSMENT / PLAN:  PULMONARY A: Suspected Aspiration - during arrest  COPD without acute  exacerbation Acute on chronic hypercarbic/hypoxemic respiratory failure Subcutaneous emphysema (no PTX on CXR, ? Tracheal injury) P:   PRVC 8 cc/kg Wean PEEP / FiO2 for sats > 88-95% VAP prevention measures  Follow intermittent CXR  Continue steroids, bronchodilators  CARDIOVASCULAR A:  Asystole/PEA cardiac arrest (25-30 minutes in duration, up to 30-40 times unwitnessed  Shock likely cardiogenic  Tachycardia  P:  ICU monitoring  Wean levophed for MAP > 65 (off since 11am) DNR in the event of further arrest   RENAL A:   CKD AKI improving (Creatinine 1.5 on admit now 1.29) Will get dye for CT so will need to monitor closely Hyponatremia P:   Trend BMP / urinary output Replace electrolytes as indicated Avoid nephrotoxic agents, ensure adequate renal perfusion  GASTROINTESTINAL  A:   No acute issues P:   NPO  PPI   HEMATOLOGIC A:   PE Ruled Out Anemia acute on chronic.  Suspect some component of hemodilution with resuscitation P:  Trend CBC  Continue lovenox for DVT prophylaxis Transfuse for Hgb <7%  INFECTIOUS A:   Concern for aspiration  P:   Follow CXR  Monitor fever curve / wbc trend   ENDOCRINE A:   No acute issues   P:   Follow glucose on chemistry   NEUROLOGIC A:   Acute encephalopathy - exam and EEG concerning for anoxic encephalopathy.  AKI could also be playing role in mentation.   P:   RASS goal: 0 to -1  PRN fentanyl for pain  Reviewed neuro exam / concerns with family   FAMILY  - Updates:  Son & daughter in law updated at bedside 1/3.  The indicate she has had a "rough few months" after the last hospitalization.  We reviewed her neuro exam findings and EEG.  In addition, the longer she goes with poor exam the less likely she is to recover.  Family indicate that quality of life would be important to the patient.  We will review her neuro exam again in the am and discuss plan from there.  We reviewed the concept of CPR in the event of further  arrest and they indicate she would not want further intervention.  LCB > continue current level of support.  DNR in the event of arrest.   - Inter-disciplinary family meet or Palliative Care meeting due by:  1/8   CC Time: 48 minutes  Noe Gens, NP-C [place] [facility] Pgr: [phone] or if no answer [phone] 12/10/2017, 1:34 PM

## 2017-12-10 NOTE — Procedures (Signed)
ELECTROENCEPHALOGRAM REPORT  Date of Study: 12/09/2017 (assigned to this provider on 12/10/2017)  Patient's Name: Carol Carson MRN: [id number] Date of Birth: 1941-01-12  Referring Provider: Georgann Housekeeper, NP  Clinical History: This is a 77 year old woman s/p cardiac arrest  Medications: Versed every 2 hours (last dose 2 hours prior to EEG)  Technical Summary: A multichannel digital EEG recording measured by the international 10-20 system with electrodes applied with paste and impedances below 5000 ohms performed in our laboratory with EKG monitoring in an intubated and unresponsive patient.  Hyperventilation and photic stimulation were not performed.  The digital EEG was referentially recorded, reformatted, and digitally filtered in a variety of bipolar and referential montages for optimal display.    Description: The patient is intubated and unresponsive during the recording. No continuous sedation but received IV Versed 2 hours prior to EEG. There is loss of normal background activity. The record read at a sensitivity of 3 uV/mm shows diffuse suppression of background activity. There is no spontaneous reactivity or reactivity noted with stimulation. There is muscle artifact over the frontal leads. There are occasional body jerks with muscle artifact on EEG, no associated epileptiform correlate. She is noted to have a constant jaw tremor with no EEG correlate. Hyperventilation and photic stimulation were not performed. There were no epileptiform discharges or electrographic seizures seen.   EKG lead was unremarkable.  Impression: This EEG is markedly abnormal due to diffuse background suppression and lack of EEG reactivity with stimulation.  Clinical Correlation of the above findings indicates severe diffuse cerebral dysfunction that is non-specific in etiology and can be seen in the setting of anoxic/ischemic injury, toxic/metabolic encephalopathies, or medication effect. Body jerks  and jaw tremor did not show any epileptiform correlate. Clinical correlation is advised.  Ellouise Newer, M.D.

## 2017-12-10 NOTE — Code Documentation (Signed)
  Patient Name: Carol Carson   MRN: [id number]   Date of Birth/ Sex: 10/19/1941 , female      Admission Date: 11/18/2017  Attending Provider: Collene Gobble, MD  Primary Diagnosis: Closed hip fracture, right, initial encounter [facility])   Indication: Pt was in her usual state of health until this AM, when she was noted to be in PEA for an unclear period of time. Code blue was subsequently called. At the time of arrival on scene, ACLS protocol was underway.   Technical Description:  - CPR performance duration:  21 minutes initially And repeated again for 5 min after transfer to the ICU where she coded again.  - Was defibrillation or cardioversion used? No   - Was external pacer placed? No  - Was patient intubated pre/post CPR? Yes   Medications Administered: Y = Yes; Blank = No Amiodarone    Atropine    Calcium    Epinephrine  Y  Lidocaine    Magnesium    Norepinephrine    Phenylephrine    Sodium bicarbonate  Y  Vasopressin     Post CPR evaluation:  - Final Status - Was patient successfully resuscitated ? Yes - What is current rhythm? Sinus tach with continued epi use - What is current hemodynamic status? Unstable in ICU  Miscellaneous Information:  - Labs sent, including: [place]  - Primary team notified?  Yes  - Family Notified? Yes  - Additional notes/ transfer status: ICU     Kathi Ludwig, MD  12/10/2017, 2:10 PM

## 2017-12-10 NOTE — Telephone Encounter (Signed)
lmtcb x2 for Carol Carson with [facility].

## 2017-12-10 NOTE — Care Management Note (Signed)
Case Management Note Marvetta Gibbons RN, BSN Unit 4E-Case Manager-- [place] coverage [phone]  Patient Details  Name: SYRETTA KOCHEL MRN: [id number] Date of Birth: 30-Nov-1941  Subjective/Objective:  Pt admitted with closed hip fx s/p repair with cardiorespiratory arrest on post op day 3- moved to ICU post resuscitation and intubation. 12/10/17- currently non-responsive on vent and pressors                 Action/Plan: PTA pt lived at home alone, per PT/OT recommendation for SNF- CSW following for placement needs pending pt progress- CM to follow  Expected Discharge Date:                  Expected Discharge Plan:  [place]  In-House Referral:  Clinical Social Work  Discharge planning Services  CM Consult  Post Acute Care Choice:    Choice offered to:     DME Arranged:    DME Agency:     HH Arranged:    [place] Agency:     Status of Service:  In process, will continue to follow  If discussed at Long Length of Stay Meetings, dates discussed:    Discharge Disposition:   Additional Comments:  Dawayne Patricia, RN 12/10/2017, 9:57 AM

## 2017-12-10 NOTE — Progress Notes (Signed)
OT Cancellation Note  Patient Details Name: Carol Carson MRN: [id number] DOB: 01-30-1941   Cancelled Treatment:    Reason Eval/Treat Not Completed: Medical issues which prohibited therapy.  Will check back and and determine if OT appropriate.  [place], OTR/L [phone]   Lucille Passy M 12/10/2017, 11:27 AM

## 2017-12-10 NOTE — Progress Notes (Signed)
    Subjective: Events of yesterday noted.  She was recovering from surgery and developed progressive hypoxemia and then a cardiorespiratory arrest POD3 - 12/09/17.  Moved to ICU, resusitated, and intubated.  Currently Non-responsive on ventilator, pressors.  Objective:   VITALS:   Vitals:   12/10/17 0530 12/10/17 0545 12/10/17 0600 12/10/17 0700  BP:   (!) 103/53   Pulse: (!) 135 (!) 135 (!) 136 (!) 138  Resp: (!) 28 (!) 28 (!) 28 (!) 28  Temp:  98.2 F (36.8 C) 98.4 F (36.9 C) 98.4 F (36.9 C)  TempSrc:  Esophageal    SpO2: 97% 97% 97% 97%  Weight:      Height:       CBC Latest Ref Rng & Units 12/10/2017 12/09/2017 12/09/2017  WBC 4.0 - 10.5 K/uL 11.6(H) 12.6(H) 7.0  Hemoglobin 12.0 - 15.0 g/dL 7.0(L) 7.6(L) 7.4(L)  Hematocrit 36.0 - 46.0 % 22.4(L) 26.4(L) 25.2(L)  Platelets 150 - 400 K/uL 157 176 198   BMP Latest Ref Rng & Units 12/10/2017 12/09/2017 12/09/2017  Glucose 65 - 99 mg/dL 173(H) 204(H) 231(H)  BUN 6 - 20 mg/dL 32(H) 32(H) 32(H)  Creatinine 0.44 - 1.00 mg/dL 1.90(H) 1.91(H) 1.77(H)  Sodium 135 - 145 mmol/L 134(L) 133(L) 134(L)  Potassium 3.5 - 5.1 mmol/L 4.2 4.4 4.3  Chloride 101 - 111 mmol/L 99(L) 93(L) 92(L)  CO2 22 - 32 mmol/L 27 27 26   Calcium 8.9 - 10.3 mg/dL 6.2(LL) 6.7(L) 6.8(L)   Intake/Output      01/02 0701 - 01/03 0700 01/03 0701 - 01/04 0700   P.O.     I.V. (mL/kg) 1780.2 (36.7)    NG/GT 40    IV Piggyback 300    Total Intake(mL/kg) 2120.2 (43.7)    Urine (mL/kg/hr) 500 (0.4)    Emesis/NG output 30    Total Output 530    Net +1590.2           Physical Exam: General: NAD.  Intubated, sedated.  Non-responsive.  MSK Limited by patient condition.  Dressings are C/D/I.  Hip and thigh are soft w/o erythema or ecchymosis.    Assessment / Plan: 4 Days Post-Op  S/P Procedure(s) (LRB): INTRAMEDULLARY (IM) NAIL INTERTROCHANTRIC, RIGHT (Right) by Dr. Ernesta Amble. Percell Miller on 11/13/2017  Principal Problem:   Closed hip fracture, right, initial encounter  [facility]) Active Problems:   SVT (supraventricular tachycardia) (HCC)   COPD GOLD III/ emphysematous predominantly    Essential hypertension   COPD (chronic obstructive pulmonary disease) ([place])   Fall   Hyponatremia   Closed displaced intertrochanteric fracture of right femur (HCC)   Acute renal failure superimposed on stage 3 chronic kidney disease (HCC)   Chronic respiratory failure with hypoxia (HCC)   Malnutrition of moderate degree   Cardiac arrest (HCC)  Right Intertrochanteric Femur Fracture: Cardiorespiratory arrest POD3 S/P IM Nail. Continue plan per medicine / CCM. Mobilize if she recovers from event Orthopedics will follow at a distance  Weight Bearing: WBAT Dressings: Mepilex PRN VTE prophylaxis: per primary team   Prudencio Burly III, PA-C 12/10/2017, 7:10 AM

## 2017-12-10 NOTE — Progress Notes (Signed)
PT Cancellation Note  Patient Details Name: Carol Carson MRN: [id number] DOB: [birth year]   Cancelled Treatment:    Reason Eval/Treat Not Completed: Medical issues which prohibited therapy. According to chart, pt still unresponsive on ventilator and pressors. Will follow up for PT treatment when medically appropriate.  Mabeline Caras, PT, DPT Acute Rehab Services  Pager: [place] 12/10/2017, 7:26 AM

## 2017-12-10 NOTE — Progress Notes (Signed)
Called [facility] re: referral, spoke with Katy Apo Referral [phone].  [facility]S requests call back if pt has brain death testing or at time of death.  Information given to Emer RN

## 2017-12-11 ENCOUNTER — Telehealth: Payer: Self-pay | Admitting: Internal Medicine

## 2017-12-11 ENCOUNTER — Inpatient Hospital Stay (HOSPITAL_COMMUNITY): Payer: Medicare Other

## 2017-12-11 LAB — BASIC METABOLIC PANEL
Anion gap: 8 (ref 5–15)
BUN: 36 mg/dL — ABNORMAL HIGH (ref 6–20)
CALCIUM: 6.7 mg/dL — AB (ref 8.9–10.3)
CO2: 26 mmol/L (ref 22–32)
Chloride: 103 mmol/L (ref 101–111)
Creatinine, Ser: 2.31 mg/dL — ABNORMAL HIGH (ref 0.44–1.00)
GFR calc non Af Amer: 19 mL/min — ABNORMAL LOW (ref >60)
GFR, EST AFRICAN AMERICAN: 22 mL/min — AB (ref >60)
Glucose, Bld: 109 mg/dL — ABNORMAL HIGH (ref 65–99)
Potassium: 4.1 mmol/L (ref 3.5–5.1)
SODIUM: 137 mmol/L (ref 135–145)

## 2017-12-11 LAB — CBC
HCT: 20.9 % — ABNORMAL LOW (ref 36.0–46.0)
Hemoglobin: 6.5 g/dL — CL (ref 12.0–15.0)
MCH: 27.4 pg (ref 26.0–34.0)
MCHC: 31.1 g/dL (ref 30.0–36.0)
MCV: 88.2 fL (ref 78.0–100.0)
PLATELETS: 152 10*3/uL (ref 150–400)
RBC: 2.37 MIL/uL — ABNORMAL LOW (ref 3.87–5.11)
RDW: 16.8 % — AB (ref 11.5–15.5)
WBC: 12 10*3/uL — ABNORMAL HIGH (ref 4.0–10.5)

## 2017-12-11 LAB — PREPARE RBC (CROSSMATCH)

## 2017-12-11 LAB — MAGNESIUM: MAGNESIUM: 1.7 mg/dL (ref 1.7–2.4)

## 2017-12-11 MED ORDER — MAGNESIUM SULFATE 4 GM/100ML IV SOLN
4.0000 g | Freq: Once | INTRAVENOUS | Status: AC
  Administered 2017-12-11: 4 g via INTRAVENOUS
  Filled 2017-12-11: qty 100

## 2017-12-11 MED ORDER — MORPHINE BOLUS VIA INFUSION
1.0000 mg | INTRAVENOUS | Status: DC | PRN
  Administered 2017-12-11 (×2): 2 mg via INTRAVENOUS
  Filled 2017-12-11: qty 10

## 2017-12-11 MED ORDER — SODIUM CHLORIDE 0.9 % IV SOLN
Freq: Once | INTRAVENOUS | Status: AC
  Administered 2017-12-11: 07:00:00 via INTRAVENOUS

## 2017-12-11 MED ORDER — SODIUM CHLORIDE 0.9 % IV SOLN
1.0000 mg/h | INTRAVENOUS | Status: DC
  Administered 2017-12-11: 5 mg/h via INTRAVENOUS
  Filled 2017-12-11: qty 10

## 2017-12-12 LAB — TYPE AND SCREEN
ABO/RH(D): O POS
ANTIBODY SCREEN: NEGATIVE
UNIT DIVISION: 0

## 2017-12-12 LAB — BPAM RBC
BLOOD PRODUCT EXPIRATION DATE: 201902012359
ISSUE DATE / TIME: 201901040656
UNIT TYPE AND RH: 5100

## 2017-12-17 ENCOUNTER — Ambulatory Visit: Payer: Medicare Other | Admitting: Cardiology

## 2018-01-08 NOTE — Progress Notes (Signed)
PCCM INTERVAL PROGRESS NOTE  Several discussions with family this morning including son, daughter-in-law, and her 2 sisters, they fully understand that she has likely suffered a severe anoxic brain injury secondary to cardiac arrest.  They understand that her prognosis from a neurological standpoint is very poor.  At this point they have elected to pursue comfort measures.  Plan: Initiate low-dose morphine infusion Wean from ventilator with goal of extubation per respiratory therapy palliative protocol Comfort cart to bedside Chaplain consult  Carol Carson, AGACNP-BC [facility] Pulmonology/Critical Care Pager [phone] or [phone]  12/19/17 11:02 AM

## 2018-01-08 NOTE — Telephone Encounter (Signed)
[facility] but no answer. We contacted [facility] and per office protocol, this encounter will be closed.

## 2018-01-08 NOTE — Progress Notes (Signed)
Provided prayer, emotional and grief support along with the nurses as this patient passed away.  Family okay and grieving appropriately.  Family grateful for the support of the nurses.  Thank you to the medical team for caring for this patient and family.    December 20, 2017 1320  Clinical Encounter Type  Visited With Patient and family together  Visit Type Follow-up;Spiritual support;Death  Spiritual Encounters  Spiritual Needs Prayer;Emotional;Grief support

## 2018-01-08 NOTE — Progress Notes (Signed)
Pt passed away 1314 with RN and family at bedside. RN will continue to provide support.

## 2018-01-08 NOTE — Telephone Encounter (Signed)
Noted.  Per triage protocol, message will be closed.

## 2018-01-08 NOTE — Progress Notes (Signed)
PT Cancellation Note  Patient Details Name: Carol Carson MRN: [id number] DOB: 1941/07/22   Cancelled Treatment:    Reason Eval/Treat Not Completed: Medical issues which prohibited therapy. PT signing off. Please reorder if medically appropriate.  Mabeline Caras, PT, DPT Acute Rehab Services  Pager: [place] 12-30-17, 7:26 AM

## 2018-01-08 NOTE — Progress Notes (Signed)
Called Dr. Jimmy Footman from [facility] to report a critical Lab value of 6.5 Hgb. MD ordered 1 pRBC to be transfused. Post transfusion CBC to be collected.

## 2018-01-08 NOTE — Plan of Care (Signed)
Pt continues to diuresis without medication. Pt. Is excreting >74ml of urine per hour.

## 2018-01-08 NOTE — Progress Notes (Signed)
Visited with family around their decision to remove artificial apparatus from patient.  Family is sharing some stories and are most appreciative of the care they have received.  We prayed together.  Family asked that I return and pray once again after everything is removed.  Chaplain happy to provide care to this family.    12-19-2017 1227  Clinical Encounter Type  Visited With Patient;Family  Visit Type Initial;Psychological support;Spiritual support;Critical Care  Spiritual Encounters  Spiritual Needs Prayer;Grief support;Emotional

## 2018-01-08 NOTE — Progress Notes (Signed)
RN just spoke with ME concerning patient scenerio. ME will review patients case and examine patient in the AM. Family notified that patient will not be at the funeral home and ME to contact next of kin.

## 2018-01-08 NOTE — Discharge Summary (Signed)
PULMONARY / CRITICAL CARE MEDICINE DEATH SUMMARY   Name: Carol Carson MRN: [id number] DOB: 1941/10/26    ADMISSION DATE:  11/29/2017 CONSULTATION DATE: 12-15-22 DATE OF DEATH Dec 17, 2017  REFERRING MD: [facility] hospitalist Dr. Sarajane Jews  CHIEF COMPLAINT: Cardiac arrest  Final cause of death Anoxic brain injury and encephalopathy  Secondary causes of death Acute on chronic hypoxemic and hypercapnic respiratory failure PEA cardiac arrest Severe COPD Right hip fracture, surgically repaired Right-sided rib fractures Subcutaneous emphysema Aspiration pneumonia Septic shock SVT Acute on chronic (stage III) renal failure Hyponatremia Anemia of chronic disease Protein calorie malnutrition Peripheral vascular disease History of uterine/endometrial cancer History of irritable bowel syndrome History of hypertension    HISTORY OF PRESENT ILLNESS/Hospital course:   77 year old female with past medical history as below, which is significant for COPD, chronic respiratory failure with hypoxia on 2 L, endometrial cancer in remission, hypertension, SVT.  She was recently admitted in November 2018 for hypercapnic respiratory failure thought to be secondary to acute exacerbation of COPD.  She required intubation and was difficult to wean but eventually was successfully extubated.  She completed a course of steroids after discharge.  At that time she had an echo which showed a dilated and poorly functioning right ventricle.  Other cardiac workup not significant for ischemia.  In the p.m. hours of 11/13/2017 she suffered a fall at home while attempting to put her slippers on.  She suffered a right hip fracture which was repaired with an intramedullary nail on 12/30.  She also required increased oxygen to 4 L from 2 L.  She did have a bump in her renal function and hyponatremia which were improving with IV fluid hydration.  Then in the early morning hours of 1/2 about 30-40 minutes after last being  seen well, she was found to be in asystole.  She underwent 22 minutes of ACLS and intubation prior to ROSC.  She was transferred to the ICU where she suffered another 5 minutes of PEA.  Post arrest she was unresponsive.  Course complicated by aspiration pneumonia for which she was treated with antibiotics.  She developed septic shock with associated acute renal failure.  Unfortunately her neurological exam never improved.  Her EEG and clinical exam was consistent with a severe hypoxic brain injury.  Based on this discussions were undertaken with the patient's family regarding her goals for care.  Decision was made to transition to comfort care, withdraw mechanical ventilator support.  This was done on December 17, 2017.  She died with family at bedside on 12-17-2017.   STUDIES:  CT head 1/2 > no acute abnormalities  CTA chest 1/2 > neg for PE, multiple acute right sided rib fractures, subcutaneous emphysema throughout the anterior aspect of the chest extending into the cervical region, patchy consolidation c/w multilobar PNA (recent aspiration), aortic atherosclerosis CT neck 1/2 > Superficial subcutaneous gas tracking cephalad from the thoracic inlet. There is a small solitary deep focus of gas posterior to the right thyroid lobe, but the other deep soft tissue spaces of the neck are normal, intubated. Motion artifact. No other acute findings in the neck.  3. Nondisplaced anterior right second rib fracture as seen on the chest CTA today reported separately. EEG 1/2 >> consistent with severe diffuse cerebral dysfunction that is non-specific in etiology & can be sen in the setting of anoxic/ischemic injury, toxic metabolic encephalopathies or medication effect.     ANTIBIOTICS: Rocephin 1/2 >>  Flagyl 1/2 >>   SIGNIFICANT EVENTS: 12/29 admit 12/30  OR for repair of hip fx with IM nail.  01/02 Cardiac arrest   LINES/TUBES: CVL 1/2 >> Art 1/2 >> ETT 1/2 >>   Baltazar Apo, MD, PhD 12/31/2017, 9:19 AM [facility] [phone] or if no answer [phone]

## 2018-01-08 NOTE — Progress Notes (Signed)
PULMONARY / CRITICAL CARE MEDICINE   Name: Carol Carson MRN: [id number] DOB: [birth year]    ADMISSION DATE:  11/11/2017 CONSULTATION DATE: January 2  REFERRING MD: [facility] hospitalist Dr. Sarajane Jews  CHIEF COMPLAINT: Cardiac arrest  HISTORY OF PRESENT ILLNESS:   77 year old female with past medical history as below, which is significant for COPD, chronic respiratory failure with hypoxia on 2 L, endometrial cancer in remission, hypertension, SVT.  She was recently admitted in November 2018 for hypercapnic respiratory failure thought to be secondary to acute exacerbation of COPD.  She required intubation and was difficult to wean but eventually was successfully extubated.  She completed a course of steroids after discharge.  At that time she had an echo which showed a dilated and poorly functioning right ventricle.  Other cardiac workup not significant for ischemia.  In the p.m. hours of 11/15/2017 she suffered a fall at home while attempting to put her slippers on.  She suffered a right hip fracture which was repaired with an intramedullary nail on 12/30.  She also required increased oxygen to 4 L from 2 L.  She did have a bump in her renal function and hyponatremia which were improving with IV fluid hydration.  Then in the early morning hours of 1/2 about 30-40 minutes after last being seen well, she was found to be in asystole.  She underwent 22 minutes of ACLS and intubation prior to ROSC.  She was transferred to the ICU where she suffered another 5 minutes of PEA.  Post arrest she was unresponsive.  PCCM asked to evaluate.   SUBJECTIVE:  No acute events overnight. Did not require pressors or sedation approx last 24 hours. Neuro exam remains poor.   VITAL SIGNS: BP 133/63   Pulse (!) 135   Temp 98.8 F (37.1 C)   Resp (!) 31   Ht 5\' 3"  (1.6 m)   Wt 48.5 kg (107 lb)   SpO2 100%   BMI 18.95 kg/m   HEMODYNAMICS: CVP:  [1 mmHg-16 mmHg] 6 mmHg  VENTILATOR SETTINGS: Vent Mode:  PRVC FiO2 (%):  [40 %] 40 % Set Rate:  [28 bmp] 28 bmp Vt Set:  [420 mL-450 mL] 450 mL PEEP:  [5 cmH20] 5 cmH20 Pressure Support:  [5 cmH20] 5 cmH20 Plateau Pressure:  [20 cmH20-23 cmH20] 20 cmH20  INTAKE / OUTPUT: I/O last 3 completed shifts: In: 3116.7 [I.V.:2316.7; NG/GT:100; IV Piggyback:700] Out: 66 [Urine:870; Emesis/NG output:100]  PHYSICAL EXAMINATION:  General: frail elderly female in NAD on vent  HEENT: [place]/AT, no JVD Neuro: pupils 58mm and reactive. NO cough, gag, or corneal. No response to painful stimuli.  CV: s1s2 rrr, no m/r/g PULM: even, unlabored, coarse bilateral.  GI: soft, non-tender, non-distended  Extremities: warm/dry, no edema, R hip dressing c/d/i  Skin: grossly intact. Some bruising to upper chest neck where compressions and sub q air were located.   LABS:  BMET Recent Labs  Lab 12/09/17 2132 12/10/17 0505 01/04/18 0429  NA 133* 134* 137  K 4.4 4.2 4.1  CL 93* 99* 103  CO2 27 27 26   BUN 32* 32* 36*  CREATININE 1.91* 1.90* 2.31*  GLUCOSE 204* 173* 109*    Electrolytes Recent Labs  Lab 12/09/17 0828  12/09/17 2132 12/10/17 0505 January 04, 2018 0429  CALCIUM 7.8*   < > 6.7* 6.2* 6.7*  MG 2.0  --   --  1.6* 1.7  PHOS 4.7*  --   --   --   --    < > =  values in this interval not displayed.    CBC Recent Labs  Lab 12/09/17 0828 12/10/17 0505 12/30/17 0429  WBC 12.6* 11.6* 12.0*  HGB 7.6* 7.0* 6.5*  HCT 26.4* 22.4* 20.9*  PLT 176 157 152    Coag's Recent Labs  Lab 11/09/2017 2001 12/01/2017 2114  APTT  --  30  INR 1.05  --     Sepsis Markers No results for input(s): LATICACIDVEN, PROCALCITON, O2SATVEN in the last 168 hours.  ABG Recent Labs  Lab 12/09/17 0726 12/09/17 1229  PHART 7.156* 7.374  PCO2ART 88.7* 55.0*  PO2ART 177* 79.0*    Liver Enzymes Recent Labs  Lab 12/09/17 0828 12/10/17 0505  AST 165* 547*  ALT 43 199*  ALKPHOS 55 64  BILITOT 0.7 0.5  ALBUMIN 1.9* 1.8*    Cardiac Enzymes Recent Labs  Lab  12/09/17 0828 12/09/17 1659 12/09/17 2132  TROPONINI 0.15* 0.30* 0.37*    Glucose No results for input(s): GLUCAP in the last 168 hours.  Imaging No results found.   STUDIES:  CT head 1/2 > no acute abnormalities  CTA chest 1/2 > neg for PE, multiple acute right sided rib fractures, subcutaneous emphysema throughout the anterior aspect of the chest extending into the cervical region, patchy consolidation c/w multilobar PNA (recent aspiration), aortic atherosclerosis CT neck 1/2 > Superficial subcutaneous gas tracking cephalad from the thoracic inlet. There is a small solitary deep focus of gas posterior to the right thyroid lobe, but the other deep soft tissue spaces of the neck are normal, intubated. Motion artifact. No other acute findings in the neck.  3. Nondisplaced anterior right second rib fracture as seen on the chest CTA today reported separately. EEG 1/2 >> consistent with severe diffuse cerebral dysfunction that is non-specific in etiology & can be sen in the setting of anoxic/ischemic injury, toxic metabolic encephalopathies or medication effect.    CULTURES:   ANTIBIOTICS: Rocephin 1/2 >>  Flagyl 1/2 >>   SIGNIFICANT EVENTS: 12/29 admit 12/30 OR for repair of hip fx with IM nail.  01/02 Cardiac arrest   LINES/TUBES: CVL 1/2 >> Art 1/2 >> ETT 1/2 >>  DISCUSSION: 77 year old female with past medical history of COPD suffered a fall with right hip fracture on 12/29.  This was repaired in the OR on 12/30.  She was progressing as expected until the morning hours of 1/2 when she suffered a cardiac arrest with up to 30 minutes unwitnessed downtime prior and then approximately 30 minutes of asystole. Post code she is unresponsive & required Levophed at 20 mcg for blood pressure support.  She was intubated for airway protection.  Cause of rest not entirely clear but differential includes intracranial injury (CT negative), pulmonary embolus (ruled out), and primary  respiratory arrest. She was supported with the vent on minimal to no sedation. She was able to come off pressors, but neurologic status remained poor. EEG indicated severe diffuse cerebral dysfunction.   ASSESSMENT / PLAN:  PULMONARY A: Suspected Aspiration - during arrest COPD without acute exacerbation Acute on chronic hypercarbic/hypoxemic respiratory failure Subcutaneous emphysema (no PTX on CXR, ? Tracheal injury) P:   PRVC 8 cc/kg Wean PEEP / FiO2 for sats > 88-95% VAP prevention measures Follow intermittent CXR Continue inhaled steroids and bronchodilators  CARDIOVASCULAR A:  Asystole/PEA cardiac arrest (25-30 minutes in duration, up to 30-40 times unwitnessed  Shock likely cardiogenic  Tachycardia  P:  ICU monitoring  Off pressors DNR in the event of further arrest   RENAL A:  CKD AKI now worsening. Got contrast for CT 1/2, but also seems somewhat overloaded and is 5L pos for admit.  Hyponatremia improved P:   Trend BMP / urinary output Check CVP to guide fluid challenge vs diuresis.  Replace electrolytes as indicated (will get Mag this AM)  GASTROINTESTINAL A:   No acute issues P:   NPO  PPI   HEMATOLOGIC A:   PE Ruled Out Anemia acute on chronic.  Suspect some component of hemodilution with resuscitation P:  One unit PRBC transfused this AM Repeat CBC post-transfusion and again in am as usual  Monitor signs of bleeding Continue lovenox for DVT prophylaxis Transfuse for Hgb < 7  INFECTIOUS A:   Concern for aspiration P:   Follow CXR  Monitor fever curve / wbc trend  Aspiration ABX as above  ENDOCRINE A:   No acute issues   P:   Follow glucose on chemistry   NEUROLOGIC A:   Acute encephalopathy - exam and EEG concerning for anoxic encephalopathy.  AKI could also be playing role in mentation.   P:   RASS goal: 0 to -1  PRN fentanyl for pain  Neurochecks  FAMILY  - Updates:  No family present currently. Suspect they will be here at  some point today and I will update them.   - Inter-disciplinary family meet or Palliative Care meeting due by:  1/8  Georgann Housekeeper, AGACNP-BC [place] [facility] Pager [phone] or [phone]  January 02, 2018 8:36 AM

## 2018-01-08 NOTE — Procedures (Signed)
Extubation Procedure Note  Patient Details:   Name: Carol Carson DOB: 1941-01-03 MRN: [id number]   Airway Documentation:     Evaluation  O2 sats: transiently fell during during procedure Complications: Complications of increased RR and decreased SAT. expected with withdrawl Patient did not tolerate procedure well. Bilateral Breath Sounds: Fine crackles, Diminished   No   Withdrawal of life support done at this time. Extubated to RA. Family now at bedside  Saunders Glance 2018-01-04, 12:56 PM

## 2018-01-08 NOTE — Progress Notes (Signed)
[facility] Progress Note Patient Name: Carol Carson DOB: 1941-05-21 MRN: [id number]   Date of Service  Jan 05, 2018  HPI/Events of Note  Drop in Hgb from 7.0 to 6.5  eICU Interventions  Transfuse 1 unit pRBC Post-transfusion CBC     Intervention Category Intermediate Interventions: Other:  DETERDING,ELIZABETH January 05, 2018, 5:13 AM

## 2018-01-08 DEATH — deceased

## 2018-02-04 ENCOUNTER — Ambulatory Visit: Payer: Medicare Other | Admitting: Neurology

## 2018-04-10 IMAGING — DX DG CHEST 2V
2 series · 2 of 2 positions shown · non-contrast
Comparison: 06/28/2015 chest radiograph.

CLINICAL DATA: COPD exacerbation.  Cough.

EXAM:
CHEST  2 VIEW

[chest pa]
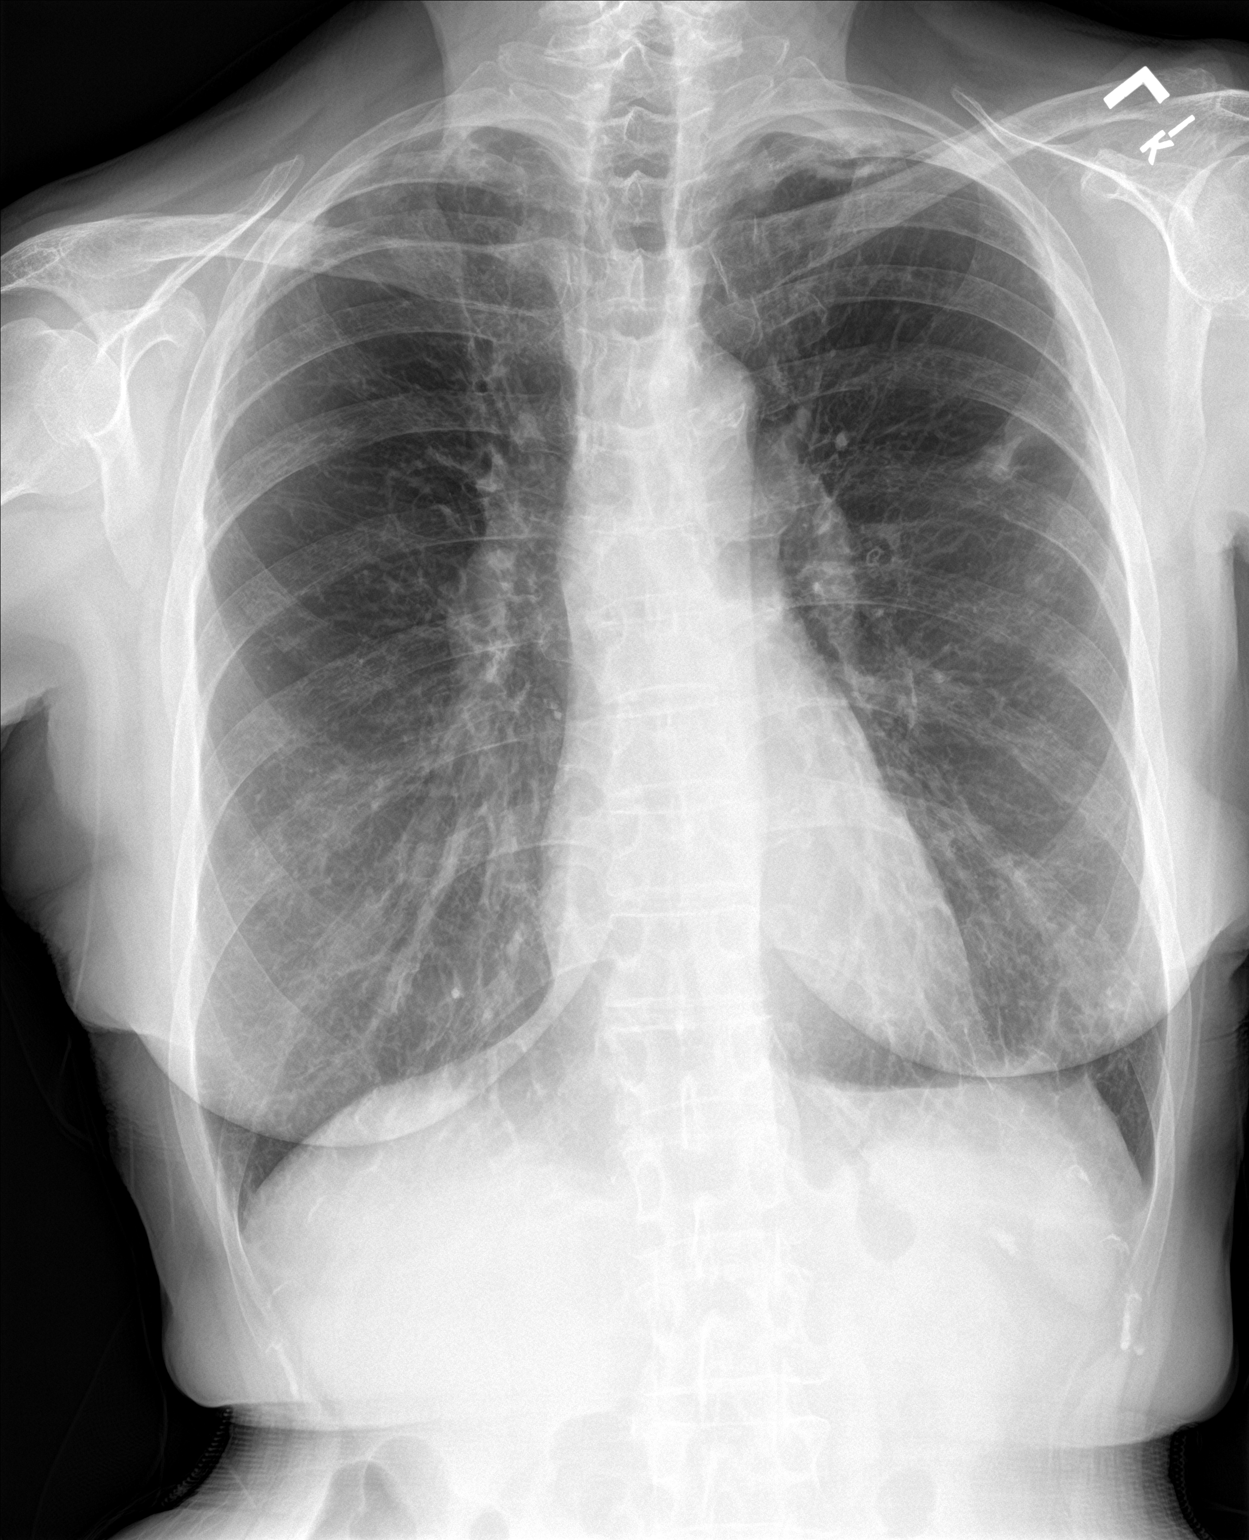

[chest lat]
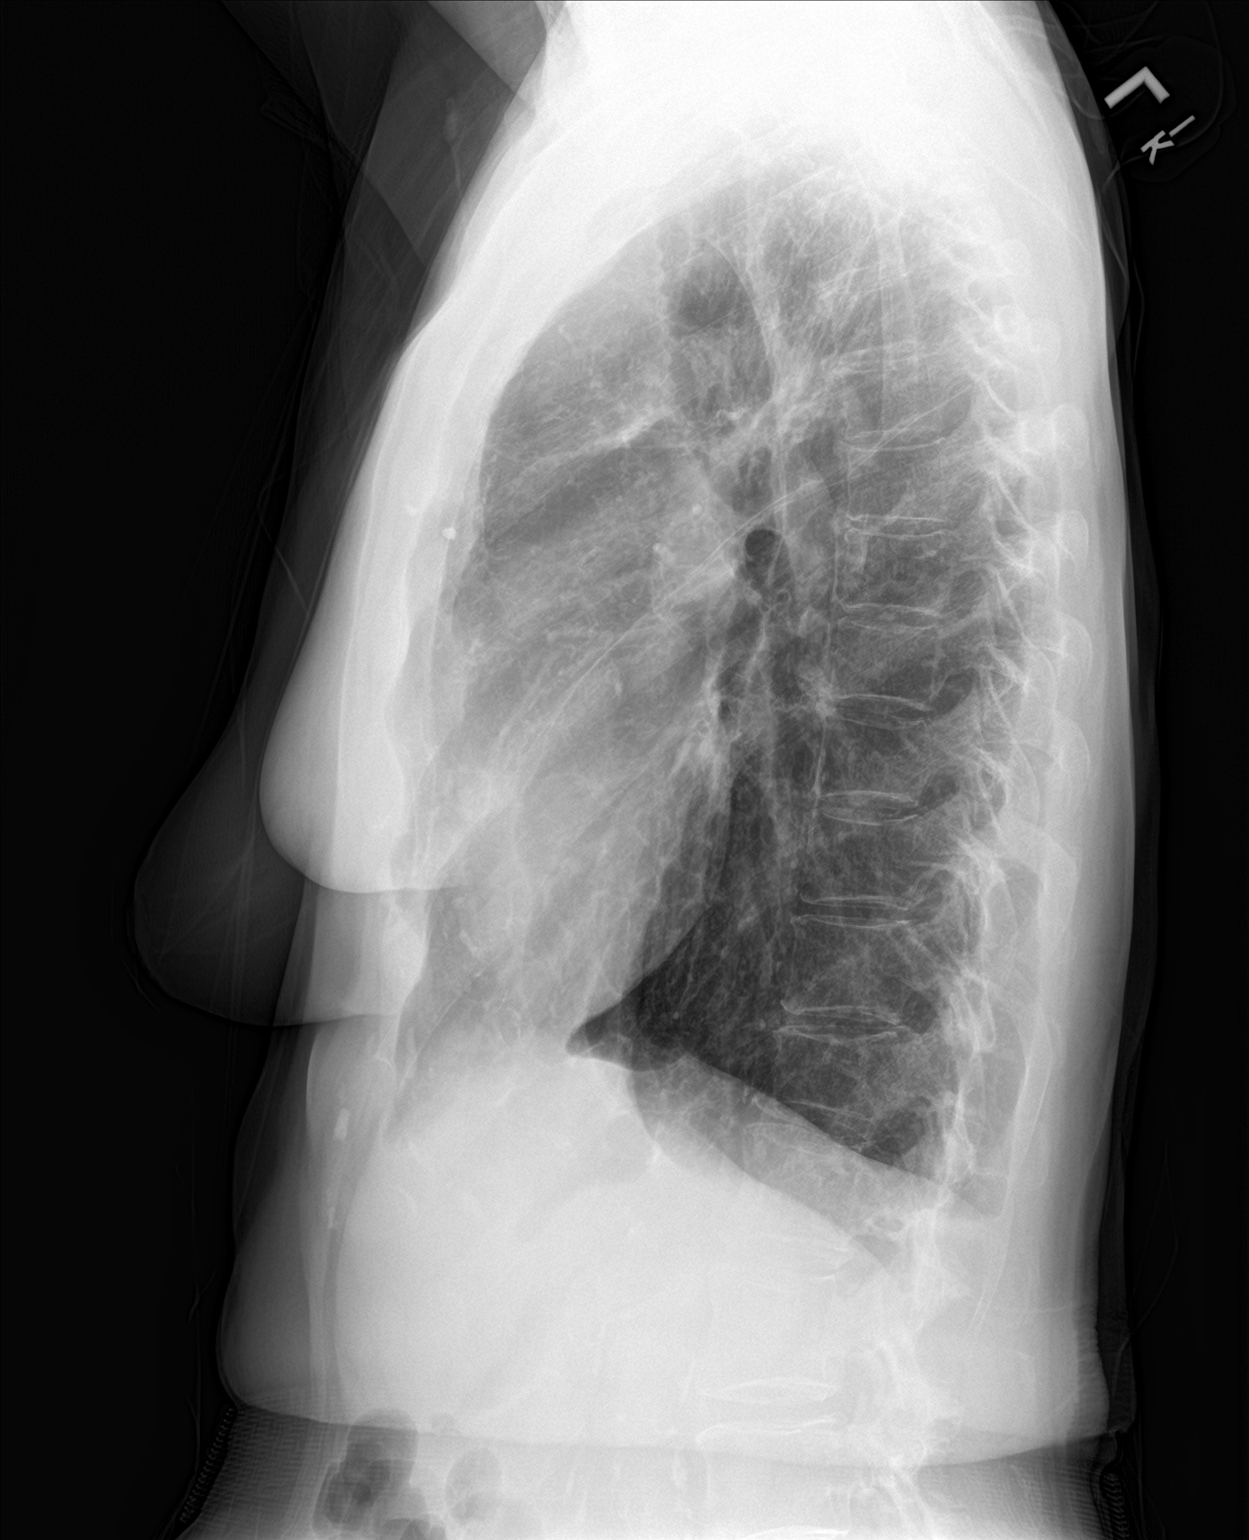

[2 of 2 positions shown; findings below may reference images not displayed]

FINDINGS: Stable cardiomediastinal silhouette with normal heart size and
aortic atherosclerosis. No pneumothorax. No pleural effusion. Mildly
hyperinflated lungs. Emphysema. Surgical sutures are noted at the
left lung apex. Stable mild biapical pleural-parenchymal scarring. A
13 mm nodular opacity in the upper left lung is stable back to
08/13/2006 chest CT and considered benign. No pulmonary edema. No
acute consolidative airspace disease.
IMPRESSION: 1. No acute cardiopulmonary disease.
2. Mildly hyperinflated lungs and emphysema, suggesting COPD.
3. Aortic atherosclerosis.

## 2019-05-10 IMAGING — MR MR HEAD W/O CM
8 of 10 series · 37 of 48 positions shown · non-contrast
Comparison: Head CT 10/10/2017

CLINICAL DATA: Altered mental status

EXAM:
MRI HEAD WITHOUT CONTRAST
TECHNIQUE: Multiplanar, multiecho pulse sequences of the brain and surrounding
structures were obtained without intravenous contrast.

[Series 3: DWI · axial · 3.0mm · 0.94mm/px · z∈[-72,+74]mm · 8 of 100 slices shown (1 of 2)]
[im 1/100]
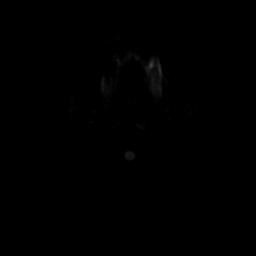
[im 12/100]
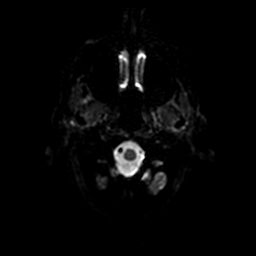
[im 34/100]
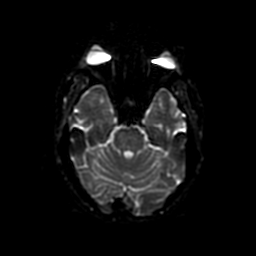
[im 45/100]
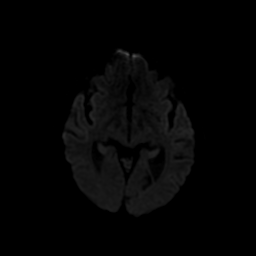
[im 56/100]
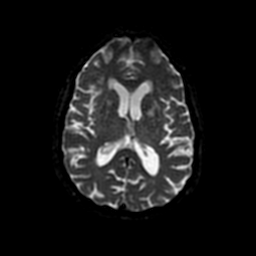
[im 67/100]
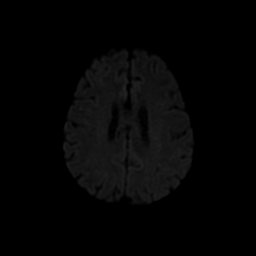
[im 89/100]
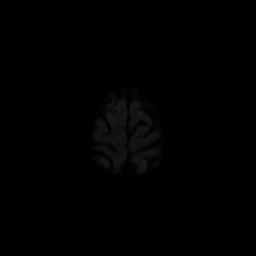
[im 100/100]
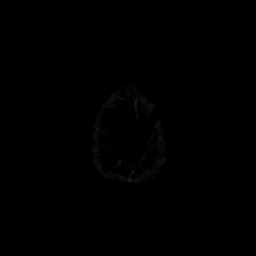

[Series 4: DWI · coronal · 4.0mm · 0.94mm/px · 8 of 70 slices shown (2 of 2)]
[im 1/70]
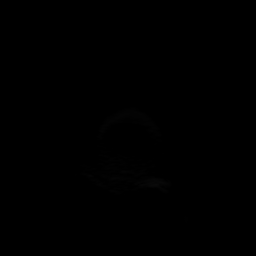
[im 10/70]
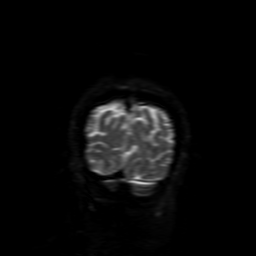
[im 20/70]
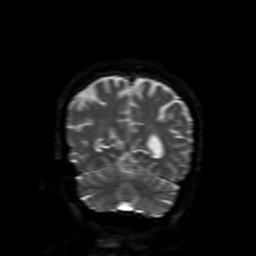
[im 30/70]
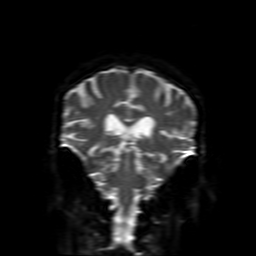
[im 40/70]
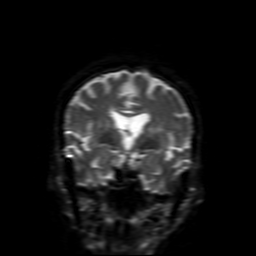
[im 50/70]
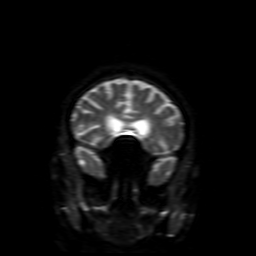
[im 60/70]
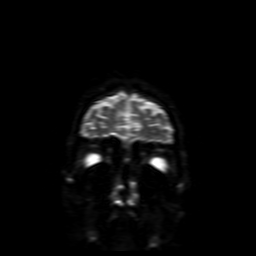
[im 70/70]
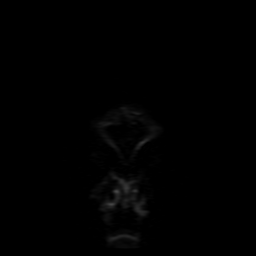

[Series 5: FLAIR · sagittal · 5.0mm · 0.47mm/px · 3 of 23 slices shown (1 of 2)]
[im 1/23]
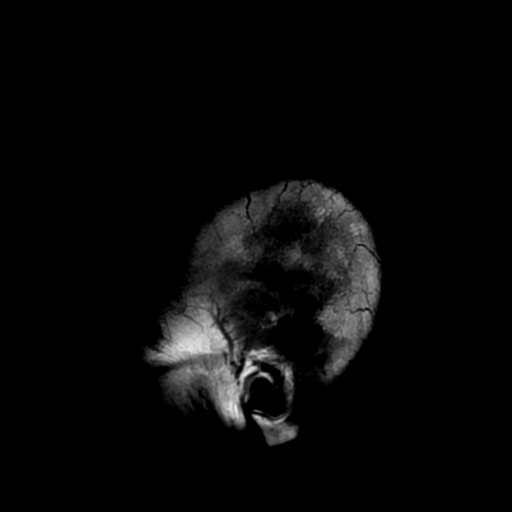
[im 12/23]
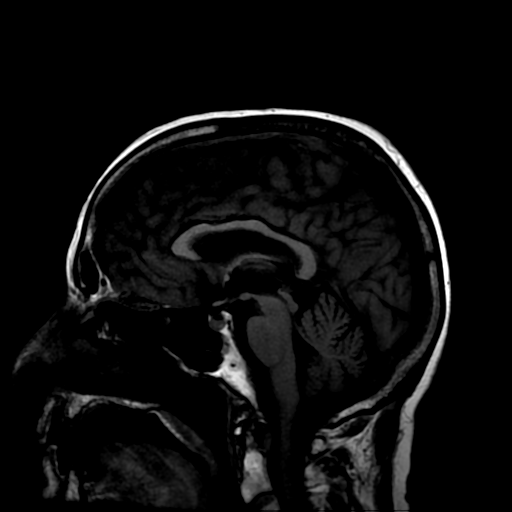
[im 23/23]
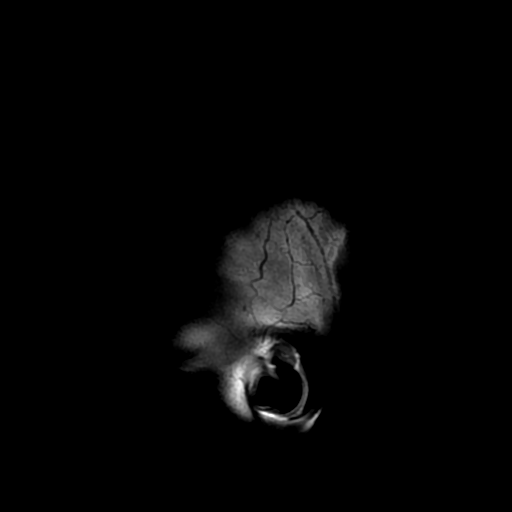

[Series 6: T2 · axial · 5.0mm · 0.43mm/px · z∈[-72,+72]mm · 3 of 25 slices shown (1 of 2)]
[im 1/25]
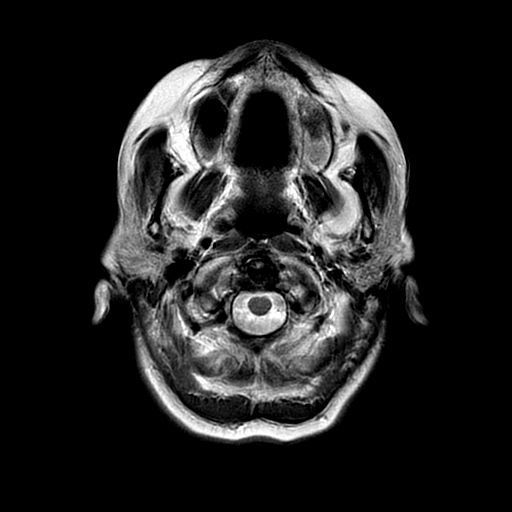
[im 13/25]
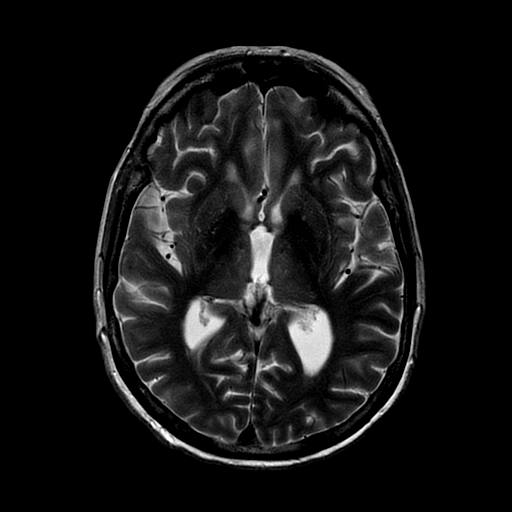
[im 25/25]
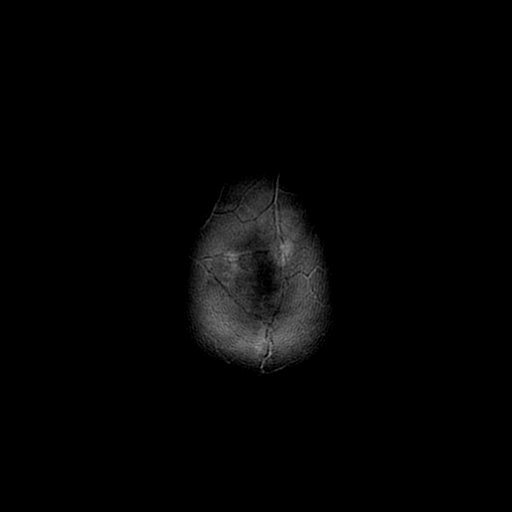

[Series 7: FLAIR · axial · 5.0mm · 0.43mm/px · z∈[-72,+72]mm · 3 of 25 slices shown (2 of 2)]
[im 1/25]
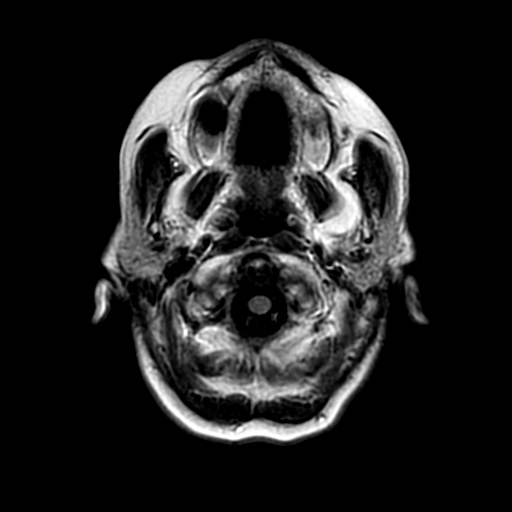
[im 13/25]
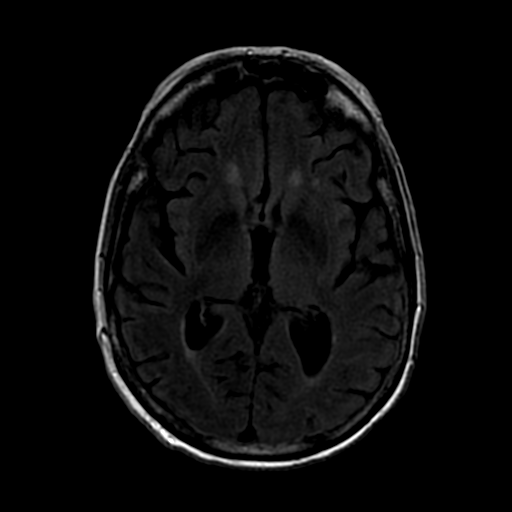
[im 25/25]
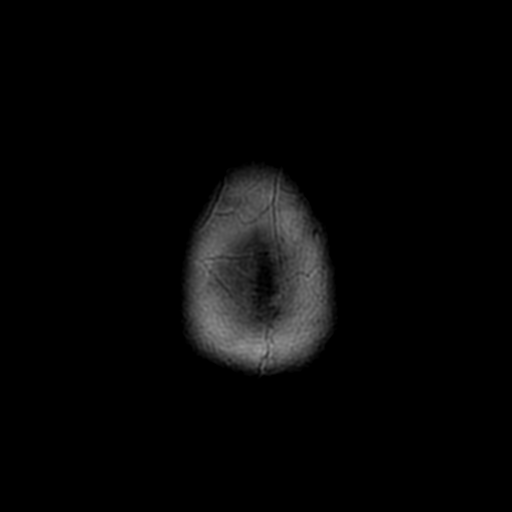

[Series 10: T2 · coronal · 5.0mm · 0.47mm/px · 3 of 29 slices shown (2 of 2)]
[im 1/29]
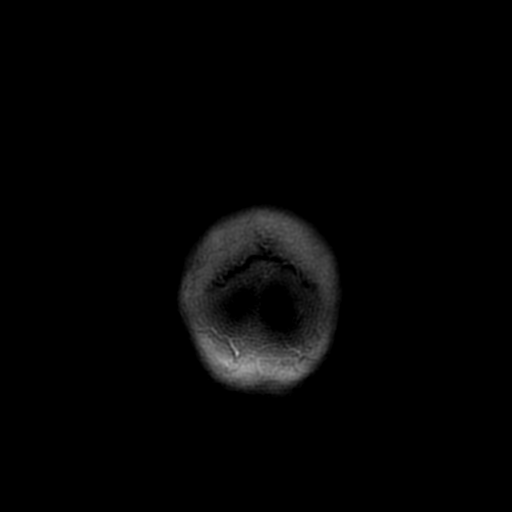
[im 15/29]
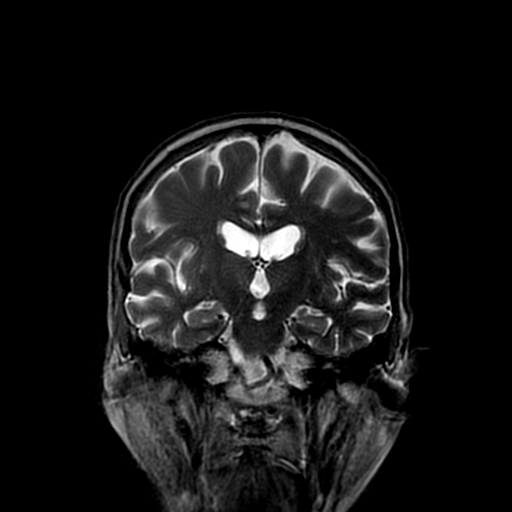
[im 29/29]
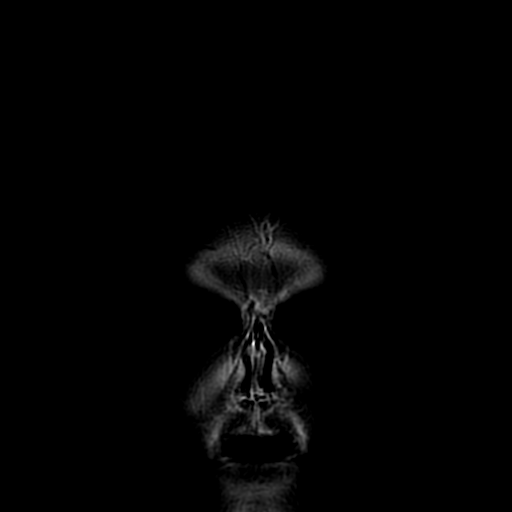

[Series 350: ADC · axial · 3.0mm · 0.94mm/px · z∈[-72,+74]mm · 5 of 48 slices shown (1 of 2)]
[im 1/48]
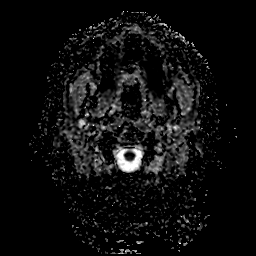
[im 12/48]
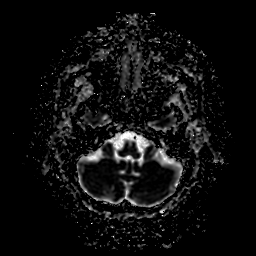
[im 24/48]
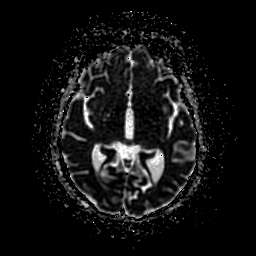
[im 36/48]
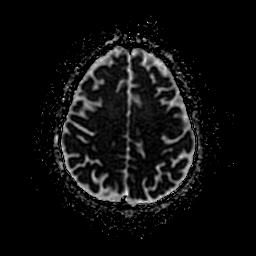
[im 48/48]
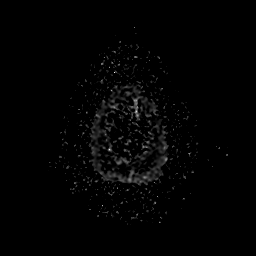

[Series 450: ADC · coronal · 4.0mm · 0.94mm/px · 4 of 35 slices shown (2 of 2)]
[im 1/35]
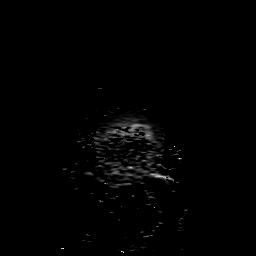
[im 12/35]
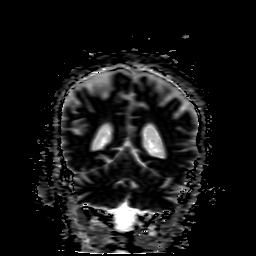
[im 23/35]
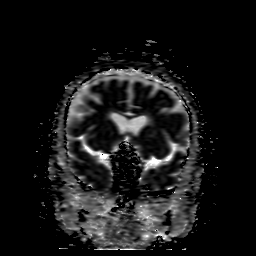
[im 35/35]
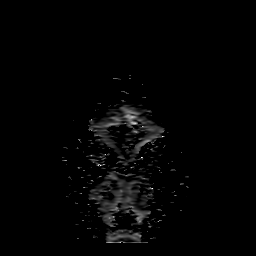

[37 of 48 positions shown; findings below may reference images not displayed]

FINDINGS: Brain: The midline structures are normal. There is no acute infarct
or acute hemorrhage. No mass lesion, hydrocephalus, dural
abnormality or extra-axial collection. There is multifocal white
matter hyperintensity suggesting chronic ischemic microangiopathy.
No age-advanced or lobar predominant atrophy. No chronic
microhemorrhage or superficial siderosis.

Vascular: Major intracranial arterial and venous sinus flow voids
are preserved.

Skull and upper cervical spine: The visualized skull base,
calvarium, upper cervical spine and extracranial soft tissues are
normal.

Sinuses/Orbits: No fluid levels or advanced mucosal thickening. No
mastoid or middle ear effusion. Normal orbits.
IMPRESSION: No acute intracranial abnormality. Sequelae of chronic ischemic
microangiopathy.
